# Patient Record
Sex: Male | Born: 1940 | Race: White | Hispanic: No | Marital: Married | Smoking: Never smoker
Health system: Southern US, Community
[De-identification: ages and names within clinical notes are randomized; demographics above are authoritative.]

## PROBLEM LIST (undated history)

## (undated) DIAGNOSIS — E785 Hyperlipidemia, unspecified: Secondary | ICD-10-CM

## (undated) DIAGNOSIS — K649 Unspecified hemorrhoids: Secondary | ICD-10-CM

## (undated) DIAGNOSIS — K219 Gastro-esophageal reflux disease without esophagitis: Secondary | ICD-10-CM

## (undated) DIAGNOSIS — H409 Unspecified glaucoma: Secondary | ICD-10-CM

## (undated) HISTORY — DX: Unspecified hemorrhoids: K64.9

## (undated) HISTORY — PX: CATARACT EXTRACTION: SUR2

## (undated) HISTORY — DX: Unspecified glaucoma: H40.9

## (undated) HISTORY — DX: Hyperlipidemia, unspecified: E78.5

## (undated) HISTORY — PX: OTHER SURGICAL HISTORY: SHX169

## (undated) HISTORY — DX: Gastro-esophageal reflux disease without esophagitis: K21.9

---

## 1998-06-03 HISTORY — PX: RETINAL DETACHMENT SURGERY: SHX105

## 2004-07-06 ENCOUNTER — Ambulatory Visit: Payer: Self-pay | Admitting: Family Medicine

## 2004-07-12 ENCOUNTER — Ambulatory Visit: Payer: Self-pay | Admitting: Internal Medicine

## 2005-05-29 ENCOUNTER — Ambulatory Visit: Payer: Self-pay | Admitting: Internal Medicine

## 2005-07-24 ENCOUNTER — Ambulatory Visit: Payer: Self-pay | Admitting: Internal Medicine

## 2005-09-30 ENCOUNTER — Ambulatory Visit: Payer: Self-pay | Admitting: Gastroenterology

## 2005-10-11 ENCOUNTER — Ambulatory Visit: Payer: Self-pay | Admitting: Gastroenterology

## 2006-10-22 DIAGNOSIS — L744 Anhidrosis: Secondary | ICD-10-CM | POA: Insufficient documentation

## 2006-10-24 ENCOUNTER — Ambulatory Visit: Payer: Self-pay | Admitting: Internal Medicine

## 2006-10-24 LAB — CONVERTED CEMR LAB
ALT: 22 units/L (ref 0–40)
AST: 24 units/L (ref 0–37)
Alkaline Phosphatase: 70 units/L (ref 39–117)
Basophils Absolute: 0 10*3/uL (ref 0.0–0.1)
Basophils Relative: 0.4 % (ref 0.0–1.0)
Calcium: 9.7 mg/dL (ref 8.4–10.5)
Chloride: 106 meq/L (ref 96–112)
Creatinine, Ser: 1 mg/dL (ref 0.4–1.5)
Eosinophils Absolute: 0.4 10*3/uL (ref 0.0–0.6)
GFR calc non Af Amer: 80 mL/min
Hemoglobin: 15.2 g/dL (ref 13.0–17.0)
MCHC: 34.3 g/dL (ref 30.0–36.0)
MCV: 94.7 fL (ref 78.0–100.0)
Monocytes Relative: 8.7 % (ref 3.0–11.0)
Platelets: 246 10*3/uL (ref 150–400)
Potassium: 4.3 meq/L (ref 3.5–5.1)
Total Protein: 7.2 g/dL (ref 6.0–8.3)

## 2007-08-19 ENCOUNTER — Telehealth (INDEPENDENT_AMBULATORY_CARE_PROVIDER_SITE_OTHER): Payer: Self-pay | Admitting: *Deleted

## 2008-06-03 HISTORY — PX: HERNIA REPAIR: SHX51

## 2009-12-19 ENCOUNTER — Encounter: Admission: RE | Admit: 2009-12-19 | Discharge: 2009-12-19 | Payer: Self-pay | Admitting: General Surgery

## 2010-10-16 NOTE — Assessment & Plan Note (Signed)
[facility]                        [place] [facility] OFFICE NOTE   Brian Terry, Brian Terry                        MRN:          [id number]  DATE:10/24/2006                            DOB:          1940/07/14    Brian Terry was seen for a comprehensive physical examination, Oct 24, 2006.   At this time, he is essentially asymptomatic.   Past medical history is unchanged.  He had a vein resection from the  right lower extremity.  He had detached retina on the right;for which  he previously had been followed by Dr. Fawn Kirk.  He had a  tonsillectomy remotely.  Colonoscopy in 2007 was negative.  [facility]  [facility].   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  This would  be benign elevation of total bilirubin of no clinical concern.  There is  no evidence of liver disease.   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  This does restrict some  of his activities such as playing golf in the summer.   Father had stomach cancer and colon polyps.  Mother had 4-vessel bypass  in her 85s, has a stroke.   Brian Terry has never smoked, does not drink.  He exercises cardiovascularly at  least 25 minutes at least 3 times a week with no cardiopulmonary  symptoms.   REVIEW OF SYSTEMS:  Essentially negative.  He does have increased ear  wax and uses Debrox.   He has had a minor rash on his glans penis, and he questions ongoing  infection.   He has also had minor postural symptoms once a week.   He does have bradycardia on his EKG, but he is asymptomatic from a  neurovascular standpoint related to this.   PHYSICAL EXAMINATION:  VITAL SIGNS:  He is 6 feet tall, weight is 173,  temperature 97.9, pulse 52, respiratory rate 16, and blood pressure  118/78.  HEENT:  It is slightly difficult to visualize the fundi as his pupils  are small.  The nasal septum is deviated to the right.  Dental hygiene  is excellent.  There  is some increased cerumen on the left.  NECK:  He has no lymphadenopathy about the head, neck, or axilla.  Thyroid is normal to palpation.  CARDIAC:  He has no murmurs or gallops.  All pulses are intact, and  there is no edema.  CHEST:  Clear to auscultation.  ABDOMEN:  He has no organomegaly or masses.  GENITALIA:  There is some mild rash over the glans penis.  There is a  varicocele on the left.  PROSTATE:  Prostate is within normal limits of size.  Hemoccult testing  is negative.  NEUROMUSCULAR:  Unremarkable.  NEUROPSYCHIATRIC:  Unremarkable.  SKIN:  He does have diffuse keratoses, none of which have worrisome  appearance.   Based on review of the chart, his LDL should be less than 135 based on  the protective pattern exhibited on NMR.   Unfortunately, there is little recourse other than to avoid dehydration  during the summer because of  his anhidrosis.  If he has been seated or  lying for a period of time, he should do isometrics or standing;& he  should also wear support hose if symptoms persist.   Nizoral will be prescribed for the rash.  It is important to keep the  tissue as dry as possible.  A hair dryer would be of benefit here as  well following  Nizoral application.   EKG does show a profound sinus bradycardia with a rate of 43 which is  asymptomatic.  Further recommendations are pending return of his labs.  No change will be made in his medication which includes includes Flomax  0.4 mg daily.If postural symptoms are an issue, the Flomax may need to  be held to see if it is a factor .     Titus Dubin. Alwyn Ren, MD,FACP,FCCP  Electronically Signed    [facility]  DD: 10/24/2006  DT: 10/24/2006  Job #: [id number]

## 2015-02-24 ENCOUNTER — Ambulatory Visit (INDEPENDENT_AMBULATORY_CARE_PROVIDER_SITE_OTHER): Payer: Medicare Other | Admitting: Gastroenterology

## 2015-02-24 ENCOUNTER — Encounter: Payer: Self-pay | Admitting: Gastroenterology

## 2015-02-24 VITALS — BP 96/68 | HR 60 | Ht 70.08 in | Wt 170.1 lb

## 2015-02-24 DIAGNOSIS — R1314 Dysphagia, pharyngoesophageal phase: Secondary | ICD-10-CM

## 2015-02-24 NOTE — Patient Instructions (Signed)
Your procedure is scheduled on 03/01/2015 at 8:30am at [facility] Separate instructions have been given

## 2015-02-24 NOTE — Assessment & Plan Note (Signed)
Solid food dysphagia-rule out esophageal stricture  Recommendations #1 EGD with dilation as indicated  CC Dr. Rex Kras

## 2015-02-24 NOTE — Progress Notes (Signed)
    _                                                                                                                History of Present Illness:  Brian Terry is a pleasant 74 year old white male referred at the request of Dr. Rex Kras for evaluation of dysphagia.  Over the past few months he's noted intermittent dysphagia to solids.  He's had to cough or vomit up some undigested food.  He has a sense of choking when this occurs.  He denies pyrosis.   Past Medical History  Diagnosis Date  . Hiatal hernia   . Hemorrhoids    Past Surgical History  Procedure Laterality Date  . Hernia repair Left 2010    inguinal   . Vein removal Right     R leg  . Retinal detachment surgery Right 2000   family history is not on file. Current Outpatient Prescriptions  Medication Sig Dispense Refill  . Multiple Vitamins-Minerals (MULTIVITAMIN ADULT PO) Take by mouth.    . pravastatin (PRAVACHOL) 10 MG tablet Take 10 mg by mouth.    . Psyllium (METAMUCIL PO) Take by mouth.    . tamsulosin (FLOMAX) 0.4 MG CAPS capsule Take 0.4 mg by mouth.    . timolol (TIMOPTIC) 0.5 % ophthalmic solution   98   No current facility-administered medications for this visit.   Allergies as of 02/24/2015 - Review Complete 02/24/2015  Allergen Reaction Noted  . Sulfa antibiotics  02/24/2015    reports that he has never smoked. He has never used smokeless tobacco. He reports that he does not drink alcohol. His drug history is not on file.   Review of Systems: Pertinent positive and negative review of systems were noted in the above HPI section. All other review of systems were otherwise negative.  Vital signs were reviewed in today's medical record Physical Exam: General: Well developed , well nourished, no acute distress Skin: anicteric Head: Normocephalic and atraumatic Eyes:  sclerae anicteric, EOMI Ears: Normal auditory acuity Mouth: No deformity or lesions Neck: Supple, no masses or  thyromegaly Lymph Nodes: no lymphadenopathy Lungs: Clear throughout to auscultation Heart: Regular rate and rhythm; no murmurs, rubs or bruits Gastroinestinal: Soft, non tender and non distended. No masses, hepatosplenomegaly or hernias noted. Normal Bowel sounds Rectal:deferred Musculoskeletal: Symmetrical with no gross deformities  Skin: No lesions on visible extremities Pulses:  Normal pulses noted Extremities: No clubbing, cyanosis, edema or deformities noted Neurological: Alert oriented x 4, grossly nonfocal Cervical Nodes:  No significant cervical adenopathy Inguinal Nodes: No significant inguinal adenopathy Psychological:  Alert and cooperative. Normal mood and affect  See Assessment and Plan under Problem List

## 2015-02-28 ENCOUNTER — Telehealth: Payer: Self-pay | Admitting: Gastroenterology

## 2015-02-28 NOTE — Telephone Encounter (Signed)
Spoke with patient and told him that the diet after the procedure will depend on what is done during procedure. May be clear liquids or soft foods.

## 2015-03-01 ENCOUNTER — Ambulatory Visit (HOSPITAL_COMMUNITY)
Admission: RE | Admit: 2015-03-01 | Discharge: 2015-03-01 | Disposition: A | Discharge status: Home / Self Care | Payer: Medicare Other | Source: Ambulatory Visit | Attending: Gastroenterology | Admitting: Gastroenterology

## 2015-03-01 ENCOUNTER — Encounter (HOSPITAL_COMMUNITY)
Admission: RE | Disposition: A | Discharge status: Home / Self Care | Payer: Self-pay | Source: Ambulatory Visit | Attending: Gastroenterology

## 2015-03-01 ENCOUNTER — Encounter (HOSPITAL_COMMUNITY): Payer: Self-pay

## 2015-03-01 DIAGNOSIS — K2980 Duodenitis without bleeding: Secondary | ICD-10-CM | POA: Diagnosis not present

## 2015-03-01 DIAGNOSIS — K2990 Gastroduodenitis, unspecified, without bleeding: Secondary | ICD-10-CM

## 2015-03-01 DIAGNOSIS — K222 Esophageal obstruction: Secondary | ICD-10-CM | POA: Insufficient documentation

## 2015-03-01 DIAGNOSIS — Z79899 Other long term (current) drug therapy: Secondary | ICD-10-CM | POA: Diagnosis not present

## 2015-03-01 DIAGNOSIS — R1310 Dysphagia, unspecified: Secondary | ICD-10-CM | POA: Diagnosis not present

## 2015-03-01 DIAGNOSIS — K449 Diaphragmatic hernia without obstruction or gangrene: Secondary | ICD-10-CM | POA: Insufficient documentation

## 2015-03-01 DIAGNOSIS — R1314 Dysphagia, pharyngoesophageal phase: Secondary | ICD-10-CM

## 2015-03-01 HISTORY — PX: ESOPHAGOGASTRODUODENOSCOPY: SHX5428

## 2015-03-01 HISTORY — PX: BALLOON DILATION: SHX5330

## 2015-03-01 SURGERY — EGD (ESOPHAGOGASTRODUODENOSCOPY)
Anesthesia: Moderate Sedation

## 2015-03-01 MED ORDER — FENTANYL CITRATE (PF) 100 MCG/2ML IJ SOLN
INTRAMUSCULAR | Status: DC | PRN
  Administered 2015-03-01 (×2): 25 ug via INTRAVENOUS

## 2015-03-01 MED ORDER — DIPHENHYDRAMINE HCL 50 MG/ML IJ SOLN
INTRAMUSCULAR | Status: AC
  Filled 2015-03-01: qty 1

## 2015-03-01 MED ORDER — FENTANYL CITRATE (PF) 100 MCG/2ML IJ SOLN
INTRAMUSCULAR | Status: AC
  Filled 2015-03-01: qty 2

## 2015-03-01 MED ORDER — FAMOTIDINE 40 MG PO TABS: 40.0000 mg | ORAL_TABLET | Freq: Every day | ORAL | Status: DC

## 2015-03-01 MED ORDER — MIDAZOLAM HCL 10 MG/2ML IJ SOLN
INTRAMUSCULAR | Status: DC | PRN
  Administered 2015-03-01: 2 mg via INTRAVENOUS
  Administered 2015-03-01: 1 mg via INTRAVENOUS

## 2015-03-01 MED ORDER — SODIUM CHLORIDE 0.9 % IV SOLN
INTRAVENOUS | Status: DC
  Administered 2015-03-01: 500 mL via INTRAVENOUS

## 2015-03-01 MED ORDER — MIDAZOLAM HCL 5 MG/ML IJ SOLN
INTRAMUSCULAR | Status: AC
  Filled 2015-03-01: qty 2

## 2015-03-01 NOTE — Discharge Instructions (Signed)
Esophagogastroduodenoscopy °Care After °Refer to this sheet in the next few weeks. These instructions provide you with information on caring for yourself after your procedure. Your caregiver may also give you more specific instructions. Your treatment has been planned according to current medical practices, but problems sometimes occur. Call your caregiver if you have any problems or questions after your procedure.  °HOME CARE INSTRUCTIONS °· Do not eat or drink anything until the numbing medicine (local anesthetic) has worn off and your gag reflex has returned. You will know that the local anesthetic has worn off when you can swallow comfortably. °· Do not drive for 24 hours after the procedure or as directed by your caregiver. °· Only take medicines as directed by your caregiver. °SEEK MEDICAL CARE IF:  °· You cannot stop coughing. °· You are not urinating at all or less than usual. °SEEK IMMEDIATE MEDICAL CARE IF: °· You have difficulty swallowing. °· You cannot eat or drink. °· You have worsening throat or chest pain. °· You have dizziness, lightheadedness, or you faint. °· You have nausea or vomiting. °· You have chills. °· You have a fever. °· You have severe abdominal pain. °· You have black, tarry, or bloody stools. °Document Released: 05/06/2012 Document Reviewed: 05/06/2012 °ExitCare® Patient Information ©2015 [employer]. This information is not intended to replace advice given to you by your health care provider. Make sure you discuss any questions you have with your health care provider. ° °

## 2015-03-01 NOTE — Op Note (Signed)
[facility] [place], [address]   ENDOSCOPY PROCEDURE REPORT  PATIENT: Brian Terry, Brian Terry  MR#: [id number] BIRTHDATE: [birth year] , 74  yrs. old GENDER: male ENDOSCOPIST: [name], MD REFERRED BY:  [name], M.D. PROCEDURE DATE:  03/01/2015 PROCEDURE:  EGD w/ biopsy and EGD w/ balloon dilation ASA CLASS:     Class II INDICATIONS:  dysphagia. MEDICATIONS: Versed 3 mg IV and Fentanyl 50 mcg IV TOPICAL ANESTHETIC:  DESCRIPTION OF PROCEDURE: After the risks benefits and alternatives of the procedure were thoroughly explained, informed consent was obtained.  The    endoscope was introduced through the mouth and advanced to the second portion of the duodenum , Without limitations.  The instrument was slowly withdrawn as the mucosa was fully examined.    ESOPHAGUS: There was a peptic stricture at the gastroesophageal junction.  The stricture was traversable.  Using a TTS-balloon the stricture was dilated up to 31mm (15-16.5-18mm for 30 seconds each. Moderate resistance; no heme.      DUODENUM: Moderate duodenal inflammation was found in the duodenal bulb. Multiple biopsies were performed. The remainder of the exam was normal.  Retroflexed views revealed no abnormalities.     The scope was then withdrawn from the patient and the procedure completed.  COMPLICATIONS: There were no immediate complications.  ENDOSCOPIC IMPRESSION: 1.   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   Multiple biopsies were performed 3.   Duodenal inflammation was found in the duodenal bulb  RECOMMENDATIONS: 1.  Await biopsy results 2.  Begin pepcid 40mg  qd  REPEAT EXAM:  eSigned:  [name], MD 03/01/2015 9:20 AM    CC:

## 2015-03-01 NOTE — Interval H&P Note (Signed)
History and Physical Interval Note:  03/01/2015 8:44 AM  Sheryle Spray  has presented today for surgery, with the diagnosis of dysphagia  The various methods of treatment have been discussed with the patient and family. After consideration of risks, benefits and other options for treatment, the patient has consented to  Procedure(s): ESOPHAGOGASTRODUODENOSCOPY (EGD) (N/A) BALLOON DILATION (N/A) as a surgical intervention .  The patient's history has been reviewed, patient examined, no change in status, stable for surgery.  I have reviewed the patient's chart and labs.  Questions were answered to the patient's satisfaction.    The recent H&P (dated *02/24/15**) was reviewed, the patient was examined and there is no change in the patients condition since that H&P was completed.   Erskine Emery  03/01/2015, 8:45 AM    Erskine Emery

## 2015-03-01 NOTE — H&P (View-Only) (Signed)
    _                                                                                                                History of Present Illness:  Brian Terry is a pleasant 74 year old white male referred at the request of Dr. Rex Kras for evaluation of dysphagia.  Over the past few months he's noted intermittent dysphagia to solids.  He's had to cough or vomit up some undigested food.  He has a sense of choking when this occurs.  He denies pyrosis.   Past Medical History  Diagnosis Date  . Hiatal hernia   . Hemorrhoids    Past Surgical History  Procedure Laterality Date  . Hernia repair Left 2010    inguinal   . Vein removal Right     R leg  . Retinal detachment surgery Right 2000   family history is not on file. Current Outpatient Prescriptions  Medication Sig Dispense Refill  . Multiple Vitamins-Minerals (MULTIVITAMIN ADULT PO) Take by mouth.    . pravastatin (PRAVACHOL) 10 MG tablet Take 10 mg by mouth.    . Psyllium (METAMUCIL PO) Take by mouth.    . tamsulosin (FLOMAX) 0.4 MG CAPS capsule Take 0.4 mg by mouth.    . timolol (TIMOPTIC) 0.5 % ophthalmic solution   98   No current facility-administered medications for this visit.   Allergies as of 02/24/2015 - Review Complete 02/24/2015  Allergen Reaction Noted  . Sulfa antibiotics  02/24/2015    reports that he has never smoked. He has never used smokeless tobacco. He reports that he does not drink alcohol. His drug history is not on file.   Review of Systems: Pertinent positive and negative review of systems were noted in the above HPI section. All other review of systems were otherwise negative.  Vital signs were reviewed in today's medical record Physical Exam: General: Well developed , well nourished, no acute distress Skin: anicteric Head: Normocephalic and atraumatic Eyes:  sclerae anicteric, EOMI Ears: Normal auditory acuity Mouth: No deformity or lesions Neck: Supple, no masses or  thyromegaly Lymph Nodes: no lymphadenopathy Lungs: Clear throughout to auscultation Heart: Regular rate and rhythm; no murmurs, rubs or bruits Gastroinestinal: Soft, non tender and non distended. No masses, hepatosplenomegaly or hernias noted. Normal Bowel sounds Rectal:deferred Musculoskeletal: Symmetrical with no gross deformities  Skin: No lesions on visible extremities Pulses:  Normal pulses noted Extremities: No clubbing, cyanosis, edema or deformities noted Neurological: Alert oriented x 4, grossly nonfocal Cervical Nodes:  No significant cervical adenopathy Inguinal Nodes: No significant inguinal adenopathy Psychological:  Alert and cooperative. Normal mood and affect  See Assessment and Plan under Problem List

## 2015-03-02 ENCOUNTER — Encounter (HOSPITAL_COMMUNITY): Payer: Self-pay | Admitting: Gastroenterology

## 2015-03-13 ENCOUNTER — Encounter: Payer: Self-pay | Admitting: Gastroenterology

## 2015-03-28 ENCOUNTER — Telehealth: Payer: Self-pay

## 2015-03-28 ENCOUNTER — Other Ambulatory Visit: Payer: Self-pay

## 2015-03-28 MED ORDER — FAMOTIDINE 40 MG PO TABS: 40.0000 mg | ORAL_TABLET | Freq: Every day | ORAL | Status: DC

## 2015-03-28 NOTE — Telephone Encounter (Signed)
-----   Message from Ladene Artist, MD sent at 03/15/2015 12:17 PM EDT ----- Please call the patient to review the biopsy results of duodenitis. The EGD report says to take Pepcid 40 mg daily and he should stay on this long term unless he is already on another acid reducing med.   MILD CHRONIC NONSPECIFIC DUODENITIS. NO FEATURES OF SPRUE OR GRANULOMAS. ----- Message -----    From: Greggory Keen, LPN    Sent: [id number]/[id number]  11:59 AM      To: Ladene Artist, MD  Please review this patient's surgical path report from his EGD. The letter that was generated did not have any information in it. Thanks

## 2015-03-28 NOTE — Telephone Encounter (Signed)
I have left message for the patient to call back 

## 2015-07-11 DIAGNOSIS — L820 Inflamed seborrheic keratosis: Secondary | ICD-10-CM | POA: Diagnosis not present

## 2015-07-11 DIAGNOSIS — L821 Other seborrheic keratosis: Secondary | ICD-10-CM | POA: Diagnosis not present

## 2015-07-11 DIAGNOSIS — D1801 Hemangioma of skin and subcutaneous tissue: Secondary | ICD-10-CM | POA: Diagnosis not present

## 2015-07-11 DIAGNOSIS — L812 Freckles: Secondary | ICD-10-CM | POA: Diagnosis not present

## 2015-08-29 DIAGNOSIS — H401122 Primary open-angle glaucoma, left eye, moderate stage: Secondary | ICD-10-CM | POA: Diagnosis not present

## 2015-08-29 DIAGNOSIS — H401111 Primary open-angle glaucoma, right eye, mild stage: Secondary | ICD-10-CM | POA: Diagnosis not present

## 2015-08-29 DIAGNOSIS — H338 Other retinal detachments: Secondary | ICD-10-CM | POA: Diagnosis not present

## 2015-09-13 ENCOUNTER — Encounter: Payer: Self-pay | Admitting: Gastroenterology

## 2015-09-20 DIAGNOSIS — Z125 Encounter for screening for malignant neoplasm of prostate: Secondary | ICD-10-CM | POA: Diagnosis not present

## 2015-09-20 DIAGNOSIS — R972 Elevated prostate specific antigen [PSA]: Secondary | ICD-10-CM | POA: Diagnosis not present

## 2015-09-25 ENCOUNTER — Other Ambulatory Visit: Payer: Self-pay | Admitting: Gastroenterology

## 2015-11-09 ENCOUNTER — Encounter: Payer: Self-pay | Admitting: Gastroenterology

## 2015-12-22 ENCOUNTER — Other Ambulatory Visit: Payer: Self-pay | Admitting: Gastroenterology

## 2016-01-12 ENCOUNTER — Encounter: Payer: Self-pay | Admitting: Gastroenterology

## 2016-01-31 ENCOUNTER — Encounter: Payer: Medicare Other | Admitting: Gastroenterology

## 2016-02-22 DIAGNOSIS — Z23 Encounter for immunization: Secondary | ICD-10-CM | POA: Diagnosis not present

## 2016-02-22 DIAGNOSIS — H6122 Impacted cerumen, left ear: Secondary | ICD-10-CM | POA: Diagnosis not present

## 2016-02-23 DIAGNOSIS — H401111 Primary open-angle glaucoma, right eye, mild stage: Secondary | ICD-10-CM | POA: Diagnosis not present

## 2016-02-23 DIAGNOSIS — H401122 Primary open-angle glaucoma, left eye, moderate stage: Secondary | ICD-10-CM | POA: Diagnosis not present

## 2016-02-23 DIAGNOSIS — H2513 Age-related nuclear cataract, bilateral: Secondary | ICD-10-CM | POA: Diagnosis not present

## 2016-03-15 DIAGNOSIS — Z0000 Encounter for general adult medical examination without abnormal findings: Secondary | ICD-10-CM | POA: Diagnosis not present

## 2016-03-15 DIAGNOSIS — E785 Hyperlipidemia, unspecified: Secondary | ICD-10-CM | POA: Diagnosis not present

## 2016-03-15 DIAGNOSIS — Z87898 Personal history of other specified conditions: Secondary | ICD-10-CM | POA: Diagnosis not present

## 2016-03-15 DIAGNOSIS — Z79899 Other long term (current) drug therapy: Secondary | ICD-10-CM | POA: Diagnosis not present

## 2016-03-15 DIAGNOSIS — K219 Gastro-esophageal reflux disease without esophagitis: Secondary | ICD-10-CM | POA: Diagnosis not present

## 2016-03-15 DIAGNOSIS — N401 Enlarged prostate with lower urinary tract symptoms: Secondary | ICD-10-CM | POA: Diagnosis not present

## 2016-04-05 ENCOUNTER — Encounter: Payer: Self-pay | Admitting: Gastroenterology

## 2016-04-05 ENCOUNTER — Ambulatory Visit (AMBULATORY_SURGERY_CENTER): Payer: Self-pay

## 2016-04-05 VITALS — Ht 71.0 in | Wt 165.2 lb

## 2016-04-05 DIAGNOSIS — Z8371 Family history of colonic polyps: Secondary | ICD-10-CM

## 2016-04-05 MED ORDER — NA SULFATE-K SULFATE-MG SULF 17.5-3.13-1.6 GM/177ML PO SOLN: ORAL | 0 refills | Status: DC

## 2016-04-05 NOTE — Progress Notes (Signed)
Per pt, no allergies to soy or egg products.Pt not taking any weight loss meds or using  O2 at home. 

## 2016-04-15 ENCOUNTER — Telehealth: Payer: Self-pay | Admitting: Gastroenterology

## 2016-04-15 NOTE — Telephone Encounter (Signed)
Spoke with patient and told him I would leave a sample of Suprep up front to be picked up.  Patient agreed.

## 2016-04-19 ENCOUNTER — Ambulatory Visit (AMBULATORY_SURGERY_CENTER): Payer: Medicare Other | Admitting: Gastroenterology

## 2016-04-19 ENCOUNTER — Encounter: Payer: Self-pay | Admitting: Gastroenterology

## 2016-04-19 VITALS — BP 112/66 | HR 47 | Temp 96.2°F | Resp 12 | Ht 71.0 in | Wt 165.0 lb

## 2016-04-19 DIAGNOSIS — D128 Benign neoplasm of rectum: Secondary | ICD-10-CM

## 2016-04-19 DIAGNOSIS — Z1212 Encounter for screening for malignant neoplasm of rectum: Secondary | ICD-10-CM

## 2016-04-19 DIAGNOSIS — D129 Benign neoplasm of anus and anal canal: Secondary | ICD-10-CM

## 2016-04-19 DIAGNOSIS — Z1211 Encounter for screening for malignant neoplasm of colon: Secondary | ICD-10-CM | POA: Diagnosis not present

## 2016-04-19 MED ORDER — SODIUM CHLORIDE 0.9 % IV SOLN: 500.0000 mL | INTRAVENOUS | Status: DC

## 2016-04-19 NOTE — Op Note (Signed)
[name]: Brian Terry Procedure Date: 04/19/2016 9:01 AM MRN: [id number] Endoscopist: Mallie Mussel L. Loletha Carrow , MD Age: 75 Referring MD:  Date of Birth: 20-Feb-1941 Gender: Male Account #: [id number] Procedure:                Colonoscopy Indications:              Screening for colorectal malignant neoplasm (no                            polyps on colonoscopy in 2007) Medicines:                Monitored Anesthesia Care Procedure:                Pre-Anesthesia Assessment:                           - Prior to the procedure, a History and Physical                            was performed, and patient medications and                            allergies were reviewed. The patient's tolerance of                            previous anesthesia was also reviewed. The risks                            and benefits of the procedure and the sedation                            options and risks were discussed with the patient.                            All questions were answered, and informed consent                            was obtained. Prior Anticoagulants: The patient has                            taken no previous anticoagulant or antiplatelet                            agents. ASA Grade Assessment: II - A patient with                            mild systemic disease. After reviewing the risks                            and benefits, the patient was deemed in                            satisfactory condition to undergo the procedure.  After obtaining informed consent, the colonoscope                            was passed under direct vision. Throughout the                            procedure, the patient's blood pressure, pulse, and                            oxygen saturations were monitored continuously. The                            Model PCF-H190L [id number]) scope was introduced                            through the anus and advanced to  the the cecum,                            identified by appendiceal orifice and ileocecal                            valve. The colonoscopy was performed without                            difficulty. The patient tolerated the procedure                            well. The quality of the bowel preparation was                            good. The ileocecal valve, appendiceal orifice, and                            rectum were photographed. The bowel preparation                            used was SUPREP. The quality of the bowel                            preparation was evaluated using the BBPS [facility]                            Bowel Preparation Scale) with scores of: Right                            Colon = 2, Transverse Colon = 2 and Left Colon = 2.                            The total BBPS score equals 6. Scope In: 9:05:32 AM Scope Out: 9:20:38 AM Scope Withdrawal Time: 0 hours 12 minutes 9 seconds  Total Procedure Duration: 0 hours 15 minutes 6 seconds  Findings:                 The perianal  and digital rectal examinations were                            normal.                           A 5 mm polyp was found in the rectum. The polyp was                            sessile. The polyp was removed with a cold snare.                            Resection and retrieval were complete.                           The exam was otherwise without abnormality on                            direct and retroflexion views. Complications:            No immediate complications. Estimated Blood Loss:     Estimated blood loss: none. Impression:               - One 5 mm polyp in the rectum, removed with a cold                            snare. Resected and retrieved.                           - The examination was otherwise normal on direct                            and retroflexion views. Recommendation:           - Patient has a contact number available for                            emergencies. The  signs and symptoms of potential                            delayed complications were discussed with the                            patient. Return to normal activities tomorrow.                            Written discharge instructions were provided to the                            patient.                           - Resume previous diet.                           - Continue present medications.                           -  No aspirin, ibuprofen, naproxen, or other                            non-steroidal anti-inflammatory drugs for 3 days                            after polyp removal.                           - Await pathology results.                           - Repeat colonoscopy is recommended for                            surveillance. The colonoscopy date will be                            determined after pathology results from today's                            exam become available for review. Siah Kannan L. Loletha Carrow, MD 04/19/2016 9:26:32 AM This report has been signed electronically.

## 2016-04-19 NOTE — Patient Instructions (Signed)
Impression/Recommendations:  Polyp handout given to patient. No aspiring, ibuprofen, naproxen, or other NSAIDS for 3 days following polyp removal.   Tylenol only.  Repeat colonoscopy recommended for surveillance based on pathology results.  YOU HAD AN ENDOSCOPIC PROCEDURE TODAY AT [facility]:   Refer to the procedure report that was given to you for any specific questions about what was found during the examination.  If the procedure report does not answer your questions, please call your gastroenterologist to clarify.  If you requested that your care partner not be given the details of your procedure findings, then the procedure report has been included in a sealed envelope for you to review at your convenience later.  YOU SHOULD EXPECT: Some feelings of bloating in the abdomen. Passage of more gas than usual.  Walking can help get rid of the air that was put into your GI tract during the procedure and reduce the bloating. If you had a lower endoscopy (such as a colonoscopy or flexible sigmoidoscopy) you may notice spotting of blood in your stool or on the toilet paper. If you underwent a bowel prep for your procedure, you may not have a normal bowel movement for a few days.  Please Note:  You might notice some irritation and congestion in your nose or some drainage.  This is from the oxygen used during your procedure.  There is no need for concern and it should clear up in a day or so.  SYMPTOMS TO REPORT IMMEDIATELY:   Following lower endoscopy (colonoscopy or flexible sigmoidoscopy):  Excessive amounts of blood in the stool  Significant tenderness or worsening of abdominal pains  Swelling of the abdomen that is new, acute  Fever of 100F or higher For urgent or emergent issues, a gastroenterologist can be reached at any hour by calling [phone].   DIET:  We do recommend a small meal at first, but then you may proceed to your regular diet.  Drink plenty of fluids  but you should avoid alcoholic beverages for 24 hours.  ACTIVITY:  You should plan to take it easy for the rest of today and you should NOT DRIVE or use heavy machinery until tomorrow (because of the sedation medicines used during the test).    FOLLOW UP: Our staff will call the number listed on your records the next business day following your procedure to check on you and address any questions or concerns that you may have regarding the information given to you following your procedure. If we do not reach you, we will leave a message.  However, if you are feeling well and you are not experiencing any problems, there is no need to return our call.  We will assume that you have returned to your regular daily activities without incident.  If any biopsies were taken you will be contacted by phone or by letter within the next 1-3 weeks.  Please call us at [phone] if you have not heard about the biopsies in 3 weeks.    SIGNATURES/CONFIDENTIALITY: You and/or your care partner have signed paperwork which will be entered into your electronic medical record.  These signatures attest to the fact that that the information above on your After Visit Summary has been reviewed and is understood.  Full responsibility of the confidentiality of this discharge information lies with you and/or your care-partner.

## 2016-04-19 NOTE — Progress Notes (Signed)
Called to room to assist during endoscopic procedure.  Patient ID and intended procedure confirmed with present staff. Received instructions for my participation in the procedure from the performing physician.  

## 2016-04-19 NOTE — Progress Notes (Signed)
Patient HR 48. Patient denies symptoms and states this is a normal HR for him.

## 2016-04-19 NOTE — Progress Notes (Signed)
To recovery Awake alert VSS  Report to RN

## 2016-04-22 ENCOUNTER — Telehealth: Payer: Self-pay

## 2016-04-22 NOTE — Telephone Encounter (Signed)
  Follow up Call-  Call back number 04/19/2016  Post procedure Call Back phone  # [phone] (wife Bethena Roys)  Permission to leave phone message Yes  Some recent data might be hidden     Patient questions:  Do you have a fever, pain , or abdominal swelling? No. Pain Score  0 *  Have you tolerated food without any problems? Yes.    Have you been able to return to your normal activities? Yes.    Do you have any questions about your discharge instructions: Diet   No. Medications  No. Follow up visit  No.  Do you have questions or concerns about your Care? No.  Actions: * If pain score is 4 or above: No action needed, pain <4.

## 2016-04-23 ENCOUNTER — Encounter: Payer: Self-pay | Admitting: Gastroenterology

## 2016-07-09 DIAGNOSIS — L814 Other melanin hyperpigmentation: Secondary | ICD-10-CM | POA: Diagnosis not present

## 2016-07-09 DIAGNOSIS — L821 Other seborrheic keratosis: Secondary | ICD-10-CM | POA: Diagnosis not present

## 2016-07-09 DIAGNOSIS — D225 Melanocytic nevi of trunk: Secondary | ICD-10-CM | POA: Diagnosis not present

## 2016-09-06 DIAGNOSIS — H401131 Primary open-angle glaucoma, bilateral, mild stage: Secondary | ICD-10-CM | POA: Diagnosis not present

## 2016-09-06 DIAGNOSIS — D3132 Benign neoplasm of left choroid: Secondary | ICD-10-CM | POA: Diagnosis not present

## 2016-09-12 DIAGNOSIS — J019 Acute sinusitis, unspecified: Secondary | ICD-10-CM | POA: Diagnosis not present

## 2016-09-27 DIAGNOSIS — H903 Sensorineural hearing loss, bilateral: Secondary | ICD-10-CM | POA: Diagnosis not present

## 2016-11-14 DIAGNOSIS — M7989 Other specified soft tissue disorders: Secondary | ICD-10-CM | POA: Diagnosis not present

## 2016-11-14 DIAGNOSIS — R252 Cramp and spasm: Secondary | ICD-10-CM | POA: Diagnosis not present

## 2016-11-14 DIAGNOSIS — M79604 Pain in right leg: Secondary | ICD-10-CM | POA: Diagnosis not present

## 2016-11-15 LAB — LAB REPORT - SCANNED: EGFR: 83

## 2017-02-19 DIAGNOSIS — Z23 Encounter for immunization: Secondary | ICD-10-CM | POA: Diagnosis not present

## 2017-03-14 DIAGNOSIS — H401131 Primary open-angle glaucoma, bilateral, mild stage: Secondary | ICD-10-CM | POA: Diagnosis not present

## 2017-03-14 DIAGNOSIS — H1859 Other hereditary corneal dystrophies: Secondary | ICD-10-CM | POA: Diagnosis not present

## 2017-03-14 DIAGNOSIS — H2513 Age-related nuclear cataract, bilateral: Secondary | ICD-10-CM | POA: Diagnosis not present

## 2017-03-14 DIAGNOSIS — H1789 Other corneal scars and opacities: Secondary | ICD-10-CM | POA: Diagnosis not present

## 2017-03-19 DIAGNOSIS — M62838 Other muscle spasm: Secondary | ICD-10-CM | POA: Diagnosis not present

## 2017-03-21 DIAGNOSIS — M799 Soft tissue disorder, unspecified: Secondary | ICD-10-CM | POA: Diagnosis not present

## 2017-03-21 DIAGNOSIS — R293 Abnormal posture: Secondary | ICD-10-CM | POA: Diagnosis not present

## 2017-03-21 DIAGNOSIS — M542 Cervicalgia: Secondary | ICD-10-CM | POA: Diagnosis not present

## 2017-03-21 DIAGNOSIS — M6281 Muscle weakness (generalized): Secondary | ICD-10-CM | POA: Diagnosis not present

## 2017-03-25 DIAGNOSIS — R293 Abnormal posture: Secondary | ICD-10-CM | POA: Diagnosis not present

## 2017-03-25 DIAGNOSIS — M6281 Muscle weakness (generalized): Secondary | ICD-10-CM | POA: Diagnosis not present

## 2017-03-25 DIAGNOSIS — M542 Cervicalgia: Secondary | ICD-10-CM | POA: Diagnosis not present

## 2017-03-25 DIAGNOSIS — M799 Soft tissue disorder, unspecified: Secondary | ICD-10-CM | POA: Diagnosis not present

## 2017-03-27 DIAGNOSIS — M542 Cervicalgia: Secondary | ICD-10-CM | POA: Diagnosis not present

## 2017-03-27 DIAGNOSIS — R293 Abnormal posture: Secondary | ICD-10-CM | POA: Diagnosis not present

## 2017-03-27 DIAGNOSIS — M799 Soft tissue disorder, unspecified: Secondary | ICD-10-CM | POA: Diagnosis not present

## 2017-03-27 DIAGNOSIS — M6281 Muscle weakness (generalized): Secondary | ICD-10-CM | POA: Diagnosis not present

## 2017-04-01 DIAGNOSIS — R293 Abnormal posture: Secondary | ICD-10-CM | POA: Diagnosis not present

## 2017-04-01 DIAGNOSIS — M799 Soft tissue disorder, unspecified: Secondary | ICD-10-CM | POA: Diagnosis not present

## 2017-04-01 DIAGNOSIS — M542 Cervicalgia: Secondary | ICD-10-CM | POA: Diagnosis not present

## 2017-04-01 DIAGNOSIS — M6281 Muscle weakness (generalized): Secondary | ICD-10-CM | POA: Diagnosis not present

## 2017-04-04 DIAGNOSIS — M542 Cervicalgia: Secondary | ICD-10-CM | POA: Diagnosis not present

## 2017-04-04 DIAGNOSIS — R293 Abnormal posture: Secondary | ICD-10-CM | POA: Diagnosis not present

## 2017-04-04 DIAGNOSIS — M799 Soft tissue disorder, unspecified: Secondary | ICD-10-CM | POA: Diagnosis not present

## 2017-04-04 DIAGNOSIS — M6281 Muscle weakness (generalized): Secondary | ICD-10-CM | POA: Diagnosis not present

## 2017-04-14 DIAGNOSIS — H1789 Other corneal scars and opacities: Secondary | ICD-10-CM | POA: Diagnosis not present

## 2017-04-14 DIAGNOSIS — H1859 Other hereditary corneal dystrophies: Secondary | ICD-10-CM | POA: Diagnosis not present

## 2017-04-30 DIAGNOSIS — E785 Hyperlipidemia, unspecified: Secondary | ICD-10-CM | POA: Diagnosis not present

## 2017-04-30 DIAGNOSIS — N401 Enlarged prostate with lower urinary tract symptoms: Secondary | ICD-10-CM | POA: Diagnosis not present

## 2017-04-30 DIAGNOSIS — Z0000 Encounter for general adult medical examination without abnormal findings: Secondary | ICD-10-CM | POA: Diagnosis not present

## 2017-04-30 DIAGNOSIS — K219 Gastro-esophageal reflux disease without esophagitis: Secondary | ICD-10-CM | POA: Diagnosis not present

## 2017-04-30 DIAGNOSIS — N400 Enlarged prostate without lower urinary tract symptoms: Secondary | ICD-10-CM | POA: Diagnosis not present

## 2017-06-06 DIAGNOSIS — H2513 Age-related nuclear cataract, bilateral: Secondary | ICD-10-CM | POA: Diagnosis not present

## 2017-06-19 DIAGNOSIS — H21561 Pupillary abnormality, right eye: Secondary | ICD-10-CM | POA: Diagnosis not present

## 2017-06-19 DIAGNOSIS — H25811 Combined forms of age-related cataract, right eye: Secondary | ICD-10-CM | POA: Diagnosis not present

## 2017-06-19 DIAGNOSIS — H2511 Age-related nuclear cataract, right eye: Secondary | ICD-10-CM | POA: Diagnosis not present

## 2017-06-19 DIAGNOSIS — H2181 Floppy iris syndrome: Secondary | ICD-10-CM | POA: Diagnosis not present

## 2017-07-09 DIAGNOSIS — D229 Melanocytic nevi, unspecified: Secondary | ICD-10-CM | POA: Diagnosis not present

## 2017-07-09 DIAGNOSIS — D1801 Hemangioma of skin and subcutaneous tissue: Secondary | ICD-10-CM | POA: Diagnosis not present

## 2017-07-09 DIAGNOSIS — L814 Other melanin hyperpigmentation: Secondary | ICD-10-CM | POA: Diagnosis not present

## 2017-07-09 DIAGNOSIS — L821 Other seborrheic keratosis: Secondary | ICD-10-CM | POA: Diagnosis not present

## 2017-07-18 DIAGNOSIS — Z961 Presence of intraocular lens: Secondary | ICD-10-CM | POA: Diagnosis not present

## 2017-07-24 DIAGNOSIS — H21562 Pupillary abnormality, left eye: Secondary | ICD-10-CM | POA: Diagnosis not present

## 2017-07-24 DIAGNOSIS — H2512 Age-related nuclear cataract, left eye: Secondary | ICD-10-CM | POA: Diagnosis not present

## 2017-07-24 DIAGNOSIS — H25812 Combined forms of age-related cataract, left eye: Secondary | ICD-10-CM | POA: Diagnosis not present

## 2017-09-01 DIAGNOSIS — H1789 Other corneal scars and opacities: Secondary | ICD-10-CM | POA: Diagnosis not present

## 2017-10-08 DIAGNOSIS — J069 Acute upper respiratory infection, unspecified: Secondary | ICD-10-CM | POA: Diagnosis not present

## 2017-10-09 ENCOUNTER — Emergency Department (HOSPITAL_COMMUNITY)
Admission: EM | Admit: 2017-10-09 | Discharge: 2017-10-09 | Disposition: A | Discharge status: Home / Self Care | Payer: Medicare Other | Attending: Emergency Medicine | Admitting: Emergency Medicine

## 2017-10-09 ENCOUNTER — Encounter (HOSPITAL_COMMUNITY): Payer: Self-pay | Admitting: Emergency Medicine

## 2017-10-09 ENCOUNTER — Other Ambulatory Visit: Payer: Self-pay

## 2017-10-09 DIAGNOSIS — Z79899 Other long term (current) drug therapy: Secondary | ICD-10-CM | POA: Insufficient documentation

## 2017-10-09 DIAGNOSIS — R42 Dizziness and giddiness: Secondary | ICD-10-CM | POA: Insufficient documentation

## 2017-10-09 LAB — BASIC METABOLIC PANEL
Anion gap: 8 (ref 5–15)
BUN: 16 mg/dL (ref 6–20)
CO2: 26 mmol/L (ref 22–32)
Calcium: 9 mg/dL (ref 8.9–10.3)
Chloride: 108 mmol/L (ref 101–111)
Creatinine, Ser: 0.96 mg/dL (ref 0.61–1.24)
GFR calc Af Amer: >60 mL/min (ref >60)
GFR calc non Af Amer: >60 mL/min (ref >60)
Glucose, Bld: 104 mg/dL — ABNORMAL HIGH (ref 65–99)
Potassium: 4.4 mmol/L (ref 3.5–5.1)
Sodium: 142 mmol/L (ref 135–145)

## 2017-10-09 LAB — CBC WITH DIFFERENTIAL/PLATELET
Basophils Absolute: 0 10*3/uL (ref 0.0–0.1)
Basophils Relative: 0 %
Eosinophils Absolute: 0.3 10*3/uL (ref 0.0–0.7)
Eosinophils Relative: 4 %
HCT: 38.3 % — ABNORMAL LOW (ref 39.0–52.0)
Hemoglobin: 13 g/dL (ref 13.0–17.0)
Lymphocytes Relative: 20 %
Lymphs Abs: 1.5 10*3/uL (ref 0.7–4.0)
MCH: 34.1 pg — ABNORMAL HIGH (ref 26.0–34.0)
MCHC: 33.9 g/dL (ref 30.0–36.0)
MCV: 100.5 fL — ABNORMAL HIGH (ref 78.0–100.0)
Monocytes Absolute: 0.6 10*3/uL (ref 0.1–1.0)
Monocytes Relative: 8 %
Neutro Abs: 5 10*3/uL (ref 1.7–7.7)
Neutrophils Relative %: 68 %
Platelets: 129 10*3/uL — ABNORMAL LOW (ref 150–400)
RBC: 3.81 MIL/uL — ABNORMAL LOW (ref 4.22–5.81)
RDW: 13.8 % (ref 11.5–15.5)
WBC: 7.4 10*3/uL (ref 4.0–10.5)

## 2017-10-09 MED ORDER — SODIUM CHLORIDE 0.9 % IV BOLUS
1000.0000 mL | Freq: Once | INTRAVENOUS | Status: AC
  Administered 2017-10-09: 1000 mL via INTRAVENOUS

## 2017-10-09 NOTE — ED Triage Notes (Signed)
Patient reports attempting to go to the bathroom this morning, but could not make it due to dizziness. Patient felt so dizzy he fell to his knees and rested. After feeling better, he got up to try to get to the bathroom again but again felt dizzy. He reports believing this may have something to do with the medications he is taking. When EMS arrrived they reported his BP was too low to read. Patient reports to head injury, no pain and that he is not on blood thinners.

## 2017-10-09 NOTE — ED Notes (Signed)
ED Provider at bedside. KOHUT 

## 2017-10-09 NOTE — ED Notes (Signed)
Bed: UY23 Expected date:  Expected time:  Means of arrival:  Comments: 77 yo M  Fall

## 2017-10-09 NOTE — ED Provider Notes (Signed)
[facility] DEPT Provider Note   CSN: [id number] Arrival date & time: 10/09/17  0705     History   Chief Complaint Chief Complaint  Patient presents with  . Dizziness    HPI Brian Terry is a 77 y.o. male.  HPI   77 year old male with dizziness.  Happened just before arrival.  Patient was getting up out of bed when he began to feel dizzy.  He felt if he may pass out.  He denies sensation of room spinning.  No dyspnea.  No ear ringing.  No palpitations.  He was able to lower himself to the ground.  He did not lose consciousness.  He is not actually fall.  Symptoms improved and he went to get back up.  Same symptoms returned again.  On EMS arrival his blood pressure was reportedly very low.  Arrival to the emergency room he was low normotensive reported resolution of symptoms.  He was feeling fine over the past few days.  Is any fevers or chills.  Past Medical History:  Diagnosis Date  . GERD (gastroesophageal reflux disease)   . Glaucoma   . Hemorrhoids   . Hyperlipidemia     Patient Active Problem List   Diagnosis Date Noted  . Esophageal stricture 03/01/2015  . Duodenitis 03/01/2015  . Dysphagia, pharyngoesophageal phase 02/24/2015  . GILBERT'S SYNDROME 10/22/2006  . ANHIDROSIS 10/22/2006    Past Surgical History:  Procedure Laterality Date  . BALLOON DILATION N/A 03/01/2015   Procedure: BALLOON DILATION;  Surgeon: [name], MD;  Location: [facility];  Service: Endoscopy;  Laterality: N/A;  . ESOPHAGOGASTRODUODENOSCOPY N/A 03/01/2015   Procedure: ESOPHAGOGASTRODUODENOSCOPY (EGD);  Surgeon: [name], MD;  Location: [facility];  Service: Endoscopy;  Laterality: N/A;  . HERNIA REPAIR Left 2010   inguinal   . RETINAL DETACHMENT SURGERY Right 2000  . vein removal Right    R leg        Home Medications    Prior to Admission medications   Medication Sig Start Date End Date Taking? Authorizing Provider  famotidine  (PEPCID) 40 MG tablet TAKE 1 TABLET IN THE MORNING. 09/25/15   Ladene Artist, MD  Multiple Vitamins-Minerals (MULTIVITAMIN ADULT PO) Take by mouth. Solgar VM-75 MVI with chelated minerals-Take as directed    [provider]  pravastatin (PRAVACHOL) 10 MG tablet Take 10 mg by mouth.    [provider]  Psyllium (METAMUCIL PO) Take by mouth.     [provider]  tamsulosin (FLOMAX) 0.4 MG CAPS capsule Take 0.4 mg by mouth.    [provider]  timolol (TIMOPTIC) 0.5 % ophthalmic solution Place 1 drop into both eyes 2 (two) times daily.  01/27/15   [provider]    Family History Family History  Problem Relation Age of Onset  . Heart disease Mother   . Colon polyps Father   . Stomach cancer Father   . Heart disease Brother     Social History Social History   Tobacco Use  . Smoking status: Never Smoker  . Smokeless tobacco: Never Used  Substance Use Topics  . Alcohol use: No    Alcohol/week: 0.0 oz  . Drug use: No     Allergies   Sulfa antibiotics   Review of Systems Review of Systems  All systems reviewed and negative, other than as noted in HPI.  Physical Exam Updated Vital Signs BP 125/85 (BP Location: Left Arm)   Pulse 76   Temp  97.9 F (36.6 C) (Oral)   Resp 19   Ht 5\' 11"  (1.803 m)   Wt 73.9 kg (163 lb)   SpO2 98%   BMI 22.73 kg/m   Physical Exam  Constitutional: He appears well-developed and well-nourished. No distress.  HENT:  Head: Normocephalic and atraumatic.  Eyes: Conjunctivae are normal. Right eye exhibits no discharge. Left eye exhibits no discharge.  Neck: Neck supple.  Cardiovascular: Normal rate, regular rhythm and normal heart sounds. Exam reveals no gallop and no friction rub.  No murmur heard. Pulmonary/Chest: Effort normal and breath sounds normal. No respiratory distress.  Abdominal: Soft. He exhibits no distension. There is no tenderness.  Musculoskeletal: He exhibits no edema or  tenderness.  Neurological: He is alert.  Skin: Skin is warm and dry.  Psychiatric: He has a normal mood and affect. His behavior is normal. Thought content normal.  Nursing note and vitals reviewed.    ED Treatments / Results  Labs (all labs ordered are listed, but only abnormal results are displayed) Labs Reviewed  CBC WITH DIFFERENTIAL/PLATELET - Abnormal; Notable for the following components:      Result Value   RBC 3.81 (*)    HCT 38.3 (*)    MCV 100.5 (*)    MCH 34.1 (*)    Platelets 129 (*)    All other components within normal limits  BASIC METABOLIC PANEL - Abnormal; Notable for the following components:   Glucose, Bld 104 (*)    All other components within normal limits    EKG EKG Interpretation  Date/Time:  Thursday Oct 09 2017 07:44:59 EDT Ventricular Rate:  55 PR Interval:    QRS Duration: 110 QT Interval:  447 QTC Calculation: 428 R Axis:   -11 Text Interpretation:  Sinus rhythm RSR' in V1 or V2, right VCD or RVH Minimal ST elevation, anterior leads Confirmed by Virgel Manifold [phone]) on 10/09/2017 10:50:42 AM   Radiology No results found.  Procedures Procedures (including critical care time)  Medications Ordered in ED Medications  sodium chloride 0.9 % bolus 1,000 mL (1,000 mLs Intravenous New Bag/Given 10/09/17 0823)     Initial Impression / Assessment and Plan / ED Course  I have reviewed the triage vital signs and the nursing notes.  Pertinent labs & imaging results that were available during my care of the patient were reviewed by me and considered in my medical decision making (see chart for details).     77 year old male with dizziness.  He did not syncopize.  Denies vertigo.  I get the impression that this may be orthostatic hypotension.  Symptoms exacerbated with changes in position.  Reportedly his blood pressure was very low on EMS arrival.  Is now improved as well as his symptoms.  Tries to keep a log of his blood pressure over the next  several days.  He needs to be very careful and give himself extra time when he is getting up from a laying or seated position.  Follow-up with PCP.  Return precautions discussed.  Final Clinical Impressions(s) / ED Diagnoses   Final diagnoses:  Dizziness    ED Discharge Orders    None       Virgel Manifold, MD 10/15/17 1157

## 2017-10-09 NOTE — ED Notes (Signed)
Ambulated pt, o2 at 96%.

## 2017-10-09 NOTE — ED Notes (Signed)
NO RX GIVEN 

## 2017-10-14 DIAGNOSIS — Z961 Presence of intraocular lens: Secondary | ICD-10-CM | POA: Diagnosis not present

## 2018-02-15 ENCOUNTER — Other Ambulatory Visit: Payer: Self-pay

## 2018-02-15 ENCOUNTER — Emergency Department (HOSPITAL_COMMUNITY): Payer: Medicare Other

## 2018-02-15 ENCOUNTER — Encounter (HOSPITAL_COMMUNITY): Payer: Self-pay | Admitting: Emergency Medicine

## 2018-02-15 ENCOUNTER — Emergency Department (HOSPITAL_COMMUNITY)
Admission: EM | Admit: 2018-02-15 | Discharge: 2018-02-15 | Disposition: A | Discharge status: Home / Self Care | Payer: Medicare Other | Attending: Emergency Medicine | Admitting: Emergency Medicine

## 2018-02-15 DIAGNOSIS — R109 Unspecified abdominal pain: Secondary | ICD-10-CM | POA: Diagnosis not present

## 2018-02-15 DIAGNOSIS — S199XXA Unspecified injury of neck, initial encounter: Secondary | ICD-10-CM | POA: Diagnosis present

## 2018-02-15 DIAGNOSIS — S161XXA Strain of muscle, fascia and tendon at neck level, initial encounter: Secondary | ICD-10-CM | POA: Diagnosis not present

## 2018-02-15 DIAGNOSIS — M542 Cervicalgia: Secondary | ICD-10-CM | POA: Diagnosis not present

## 2018-02-15 DIAGNOSIS — R42 Dizziness and giddiness: Secondary | ICD-10-CM | POA: Insufficient documentation

## 2018-02-15 DIAGNOSIS — S3991XA Unspecified injury of abdomen, initial encounter: Secondary | ICD-10-CM | POA: Diagnosis not present

## 2018-02-15 DIAGNOSIS — Y9389 Activity, other specified: Secondary | ICD-10-CM | POA: Diagnosis not present

## 2018-02-15 DIAGNOSIS — Y92410 Unspecified street and highway as the place of occurrence of the external cause: Secondary | ICD-10-CM | POA: Insufficient documentation

## 2018-02-15 DIAGNOSIS — Z79899 Other long term (current) drug therapy: Secondary | ICD-10-CM | POA: Insufficient documentation

## 2018-02-15 DIAGNOSIS — S0990XA Unspecified injury of head, initial encounter: Secondary | ICD-10-CM | POA: Diagnosis not present

## 2018-02-15 DIAGNOSIS — R51 Headache: Secondary | ICD-10-CM | POA: Diagnosis not present

## 2018-02-15 DIAGNOSIS — Y998 Other external cause status: Secondary | ICD-10-CM | POA: Diagnosis not present

## 2018-02-15 DIAGNOSIS — R55 Syncope and collapse: Secondary | ICD-10-CM | POA: Diagnosis not present

## 2018-02-15 LAB — COMPREHENSIVE METABOLIC PANEL
ALT: 25 U/L (ref 0–44)
AST: 30 U/L (ref 15–41)
Albumin: 4.2 g/dL (ref 3.5–5.0)
Alkaline Phosphatase: 57 U/L (ref 38–126)
Anion gap: 7 (ref 5–15)
BUN: 16 mg/dL (ref 8–23)
CALCIUM: 9.6 mg/dL (ref 8.9–10.3)
CHLORIDE: 109 mmol/L (ref 98–111)
CO2: 28 mmol/L (ref 22–32)
CREATININE: 0.81 mg/dL (ref 0.61–1.24)
GFR calc non Af Amer: >60 mL/min (ref >60)
GLUCOSE: 114 mg/dL — AB (ref 70–99)
Potassium: 4.5 mmol/L (ref 3.5–5.1)
SODIUM: 144 mmol/L (ref 135–145)
Total Bilirubin: 2.6 mg/dL — ABNORMAL HIGH (ref 0.3–1.2)
Total Protein: 7.2 g/dL (ref 6.5–8.1)

## 2018-02-15 LAB — CBC WITH DIFFERENTIAL/PLATELET
BASOS PCT: 0 %
Basophils Absolute: 0 10*3/uL (ref 0.0–0.1)
EOS ABS: 0.2 10*3/uL (ref 0.0–0.7)
Eosinophils Relative: 2 %
HCT: 43.5 % (ref 39.0–52.0)
HEMOGLOBIN: 15.1 g/dL (ref 13.0–17.0)
Lymphocytes Relative: 32 %
Lymphs Abs: 2.7 10*3/uL (ref 0.7–4.0)
MCH: 34.2 pg — ABNORMAL HIGH (ref 26.0–34.0)
MCHC: 34.7 g/dL (ref 30.0–36.0)
MCV: 98.4 fL (ref 78.0–100.0)
MONO ABS: 0.7 10*3/uL (ref 0.1–1.0)
MONOS PCT: 9 %
NEUTROS PCT: 57 %
Neutro Abs: 4.9 10*3/uL (ref 1.7–7.7)
Platelets: 183 10*3/uL (ref 150–400)
RBC: 4.42 MIL/uL (ref 4.22–5.81)
RDW: 13.6 % (ref 11.5–15.5)
WBC: 8.5 10*3/uL (ref 4.0–10.5)

## 2018-02-15 LAB — I-STAT CHEM 8, ED
BUN: 15 mg/dL (ref 8–23)
CALCIUM ION: 1.21 mmol/L (ref 1.15–1.40)
CREATININE: 0.9 mg/dL (ref 0.61–1.24)
Chloride: 105 mmol/L (ref 98–111)
Glucose, Bld: 109 mg/dL — ABNORMAL HIGH (ref 70–99)
HEMATOCRIT: 42 % (ref 39.0–52.0)
HEMOGLOBIN: 14.3 g/dL (ref 13.0–17.0)
Potassium: 4.2 mmol/L (ref 3.5–5.1)
SODIUM: 138 mmol/L (ref 135–145)
TCO2: 25 mmol/L (ref 22–32)

## 2018-02-15 MED ORDER — IOPAMIDOL (ISOVUE-300) INJECTION 61%
100.0000 mL | Freq: Once | INTRAVENOUS | Status: AC | PRN
  Administered 2018-02-15: 100 mL via INTRAVENOUS

## 2018-02-15 MED ORDER — IOPAMIDOL (ISOVUE-300) INJECTION 61%
INTRAVENOUS | Status: AC
  Filled 2018-02-15: qty 100
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
## 2018-02-15 NOTE — ED Notes (Signed)
He c/o "sore and tight" across bilat. Trapezius/lower neck areas; also c/o a small area of pain at left flank. CMS intact all extremities.

## 2018-02-15 NOTE — Discharge Instructions (Signed)
He can use Tylenol for symptomatic relief.  Heating pads may also be helpful.  You should notify your doctor that your CT scan did show some increased calcifications in your coronary arteries and aorta.  There is no evidence of traumatic injury under CT scans.  Follow-up with your primary care provider for recheck.  Return here as needed for any worsening symptoms.

## 2018-02-26 DIAGNOSIS — Z23 Encounter for immunization: Secondary | ICD-10-CM | POA: Diagnosis not present

## 2018-03-11 DIAGNOSIS — H401131 Primary open-angle glaucoma, bilateral, mild stage: Secondary | ICD-10-CM | POA: Diagnosis not present

## 2018-06-05 DIAGNOSIS — H409 Unspecified glaucoma: Secondary | ICD-10-CM | POA: Diagnosis not present

## 2018-06-05 DIAGNOSIS — E785 Hyperlipidemia, unspecified: Secondary | ICD-10-CM | POA: Diagnosis not present

## 2018-06-05 DIAGNOSIS — K219 Gastro-esophageal reflux disease without esophagitis: Secondary | ICD-10-CM | POA: Diagnosis not present

## 2018-06-05 DIAGNOSIS — Z0000 Encounter for general adult medical examination without abnormal findings: Secondary | ICD-10-CM | POA: Diagnosis not present
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
## 2018-06-12 ENCOUNTER — Ambulatory Visit: Payer: Medicare Other | Admitting: Cardiology

## 2018-06-12 ENCOUNTER — Encounter: Payer: Self-pay | Admitting: Cardiology

## 2018-06-12 VITALS — BP 100/60 | HR 51 | Ht 70.5 in | Wt 155.4 lb

## 2018-06-12 DIAGNOSIS — E785 Hyperlipidemia, unspecified: Secondary | ICD-10-CM

## 2018-06-12 DIAGNOSIS — R001 Bradycardia, unspecified: Secondary | ICD-10-CM

## 2018-06-12 DIAGNOSIS — I700 Atherosclerosis of aorta: Secondary | ICD-10-CM | POA: Diagnosis not present

## 2018-06-12 DIAGNOSIS — R931 Abnormal findings on diagnostic imaging of heart and coronary circulation: Secondary | ICD-10-CM

## 2018-06-12 NOTE — Patient Instructions (Signed)
Medication Instructions:  Continue current medications  If you need a refill on your cardiac medications before your next appointment, please call your pharmacy.  Labwork: None Ordered   Take the provided lab slips with you to the lab for your blood draw.  When you have your labs (blood work) drawn today and your tests are completely normal, you will receive your results only by MyChart Message (if you have MyChart) -OR-  A paper copy in the mail.  If you have any lab test that is abnormal or we need to change your treatment, we will call you to review these results.  Testing/Procedures: Your physician has requested that you have an exercise tolerance test. For further information please visit [email or web address]. Please also follow instruction sheet, as given.   Follow-Up: Your physician recommends that you schedule a follow-up appointment in: As Needed  At [facility], you and your health needs are our priority.  As part of our continuing mission to provide you with exceptional heart care, we have created designated Provider Care Teams.  These Care Teams include your primary Cardiologist (physician) and Advanced Practice Providers (APPs -  Physician Assistants and Nurse Practitioners) who all work together to provide you with the care you need, when you need it.  Thank you for choosing [facility] at [facility]!!

## 2018-06-16 ENCOUNTER — Telehealth (HOSPITAL_COMMUNITY): Payer: Self-pay

## 2018-06-16 NOTE — Addendum Note (Signed)
Addended by: Alvina Filbert B on: 06/16/2018 12:25 PM   Modules accepted: Orders

## 2018-06-16 NOTE — Telephone Encounter (Signed)
Encounter complete. 

## 2018-06-17 ENCOUNTER — Encounter (HOSPITAL_COMMUNITY): Payer: Self-pay | Admitting: *Deleted

## 2018-06-17 ENCOUNTER — Ambulatory Visit (HOSPITAL_COMMUNITY)
Admission: RE | Admit: 2018-06-17 | Discharge: 2018-06-17 | Disposition: A | Discharge status: Home / Self Care | Payer: Medicare Other | Source: Ambulatory Visit | Attending: Cardiology | Admitting: Cardiology

## 2018-06-17 DIAGNOSIS — R931 Abnormal findings on diagnostic imaging of heart and coronary circulation: Secondary | ICD-10-CM | POA: Diagnosis not present

## 2018-06-17 LAB — EXERCISE TOLERANCE TEST
CHL CUP MPHR: 144 {beats}/min
CHL CUP RESTING HR STRESS: 58 {beats}/min
CHL RATE OF PERCEIVED EXERTION: 17
CSEPEDS: 0 s
CSEPHR: 93 %
CSEPPHR: 134 {beats}/min
Estimated workload: 10.1 METS
Exercise duration (min): 8 min

## 2018-06-17 NOTE — Progress Notes (Signed)
Dr. [name] reviewed abnormal ETT and said that pt may leave.

## 2018-06-19 ENCOUNTER — Telehealth: Payer: Self-pay | Admitting: Cardiology

## 2018-06-19 NOTE — Telephone Encounter (Signed)
° ° °  Patient has questions regarding possible heart cath procedure

## 2018-06-22 NOTE — Telephone Encounter (Signed)
Returned call to pt, pt have appt with Dr [name] 8:20 01/27

## 2018-06-25 ENCOUNTER — Telehealth: Payer: Self-pay | Admitting: Cardiology

## 2018-06-25 DIAGNOSIS — Z01812 Encounter for preprocedural laboratory examination: Secondary | ICD-10-CM

## 2018-06-25 DIAGNOSIS — R9439 Abnormal result of other cardiovascular function study: Secondary | ICD-10-CM

## 2018-06-25 DIAGNOSIS — R5383 Other fatigue: Secondary | ICD-10-CM

## 2018-06-25 NOTE — Telephone Encounter (Signed)
New Message   Patient is returning call in reference to setting up heart cath. Please call to discuss.

## 2018-06-25 NOTE — Telephone Encounter (Signed)
-----   Message from Minus Breeding, MD sent at 06/23/2018  5:23 PM EST ----- I was finally able to contact this patient.  He has an abnormal POET (Plain Old Exercise Treadmill) which was "high risk" with ST changes at six minutes.  Therefore cardiac cath is indicated. The patient understands that risks included but are not limited to stroke (1 in 1000), death (1 in 56), kidney failure [usually temporary] (1 in 500), bleeding (1 in 200), allergic reaction [possibly serious] (1 in 200).  The patient understands and agrees to proceed.   Please call him to arrange.  Send results to [name], MD

## 2018-06-25 NOTE — Telephone Encounter (Signed)
Spoke to patient.  He states she is ready to set up heart cath. Patient states he is not availalbe the week of Jan 27 ,2020 , or Feb 5,6 or 12.  Patient states he will be at his appointment on JAN 27.  PATIENT AWARE WILL DEFER TO [facility] schedule procedure

## 2018-06-25 NOTE — Telephone Encounter (Signed)
 @  [facility] [address], [place] [address] Dept: [phone] Loc: [place]  06/25/2018  You are scheduled for a Cardiac Catheterization on Monday, February 3 with Dr. Harrell Gave End.  1. Please arrive at the [facility] (Main Entrance A) at [facility]: [address], [place] [address] at 7:00 AM (This time is two hours before your procedure to ensure your preparation). Free valet parking service is available.   Special note: Every effort is made to have your procedure done on time. Please understand that emergencies sometimes delay scheduled procedures.  2. Diet: Do not eat solid foods after midnight.  The patient may have clear liquids until 5am upon the day of the procedure.  3. Labs: You will need to have blood drawn on Wednesday, January 29 at [place]  Open: [place] (Lunch 12:30 - 1:30)   Phone: [phone]. You do not need to be fasting.  4. Medication instructions in preparation for your procedure:   Contrast Allergy: No   On the morning of your procedure, take your morning medicines NOT listed above.  You may use sips of water.  5. Plan for one night stay--bring personal belongings. 6. Bring a current list of your medications and current insurance cards. 7. You MUST have a responsible person to drive you home. 8. Someone MUST be with you the first 24 hours after you arrive home or your discharge will be delayed. 9. Please wear clothes that are easy to get on and off and wear slip-on shoes.  Thank you for allowing [place] to care for you!   -- [facility]

## 2018-06-26 DIAGNOSIS — R9439 Abnormal result of other cardiovascular function study: Secondary | ICD-10-CM | POA: Diagnosis not present

## 2018-06-26 DIAGNOSIS — R5383 Other fatigue: Secondary | ICD-10-CM | POA: Diagnosis not present

## 2018-06-26 DIAGNOSIS — Z01812 Encounter for preprocedural laboratory examination: Secondary | ICD-10-CM | POA: Diagnosis not present

## 2018-06-26 LAB — BASIC METABOLIC PANEL
BUN/Creatinine Ratio: 17 (ref 10–24)
BUN: 15 mg/dL (ref 8–27)
CALCIUM: 9.6 mg/dL (ref 8.6–10.2)
CHLORIDE: 99 mmol/L (ref 96–106)
CO2: 22 mmol/L (ref 20–29)
Creatinine, Ser: 0.86 mg/dL (ref 0.76–1.27)
GFR calc Af Amer: 97 mL/min/{1.73_m2} (ref >59)
GFR, EST NON AFRICAN AMERICAN: 84 mL/min/{1.73_m2} (ref >59)
GLUCOSE: 58 mg/dL — AB (ref 65–99)
POTASSIUM: 5 mmol/L (ref 3.5–5.2)
SODIUM: 136 mmol/L (ref 134–144)

## 2018-06-26 LAB — CBC
Hematocrit: 39 % (ref 37.5–51.0)
Hemoglobin: 13.7 g/dL (ref 13.0–17.7)
MCH: 36 pg — ABNORMAL HIGH (ref 26.6–33.0)
MCHC: 35.1 g/dL (ref 31.5–35.7)
MCV: 102 fL — ABNORMAL HIGH (ref 79–97)
PLATELETS: 185 10*3/uL (ref 150–450)
RBC: 3.81 x10E6/uL — AB (ref 4.14–5.80)
RDW: 13 % (ref 11.6–15.4)
WBC: 5.8 10*3/uL (ref 3.4–10.8)

## 2018-06-26 LAB — TSH: TSH: 2.41 u[IU]/mL (ref 0.450–4.500)

## 2018-06-27 NOTE — H&P (View-Only) (Signed)
Cardiology Office Note   Date:  06/29/2018   ID:  Brian Terry, DOB 1940/08/17, MRN [id number]  PCP:  [name], MD  Cardiologist:   No primary care provider on file. Referring:  [name], MD   Chief Complaint  Patient presents with  . Abnormal Stress Test      History of Present Illness: Brian Terry is a 78 y.o. male who is referred by [name], MD for evaluation of coronary artery calcium seen on CT.   CT of the abdomen demonstratede xtensive coronary artery calcifications and abdominal aortic calcifications.  I sent him for a POET (Plain Old Exercise Treadmill) which demonstrated ST depression suggestive of ischemia.  He is being referred for a cardiac cath.    He came today to discuss many questions that he has.  He is had no new symptoms. The patient denies any new symptoms such as chest discomfort, neck or arm discomfort. There has been no new shortness of breath, PND or orthopnea. There have been no reported palpitations, presyncope or syncope.      Past Medical History:  Diagnosis Date  . GERD (gastroesophageal reflux disease)   . Glaucoma   . Hemorrhoids   . Hyperlipidemia     Past Surgical History:  Procedure Laterality Date  . BALLOON DILATION N/A 03/01/2015   Procedure: BALLOON DILATION;  Surgeon: [name], MD;  Location: [facility];  Service: Endoscopy;  Laterality: N/A;  . CATARACT EXTRACTION    . ESOPHAGOGASTRODUODENOSCOPY N/A 03/01/2015   Procedure: ESOPHAGOGASTRODUODENOSCOPY (EGD);  Surgeon: [name], MD;  Location: [facility];  Service: Endoscopy;  Laterality: N/A;  . HERNIA REPAIR Left 2010   inguinal   . RETINAL DETACHMENT SURGERY Right 2000  . vein removal Right    R leg     Current Outpatient Medications  Medication Sig Dispense Refill  . acetaminophen (TYLENOL) 650 MG CR tablet Take 1,300-1,950 mg by mouth daily.     . carbamide peroxide (DEBROX) 6.5 % OTIC solution Place 5 drops into the right ear daily as  needed (for earwax build up).    . famotidine ([employer] ACID CONTROLLER MAX ST) 20 MG tablet Take 20 mg by mouth daily.    . Multiple Vitamin (MULTIVITAMIN WITH MINERALS) TABS tablet Take 1 tablet by mouth daily. Solgar Multiple vitamins with chelated minerals    . Psyllium (METAMUCIL PO) Take 2-4 capsules by mouth daily.     . tamsulosin (FLOMAX) 0.4 MG CAPS capsule Take 0.4 mg by mouth daily after supper. 30 minutes after supper.    . timolol (TIMOPTIC) 0.5 % ophthalmic solution Place 1 drop into both eyes 2 (two) times daily.   98  . rosuvastatin (CRESTOR) 20 MG tablet Take 1 tablet (20 mg total) by mouth daily. 90 tablet 3   Current Facility-Administered Medications  Medication Dose Route Frequency Provider Last Rate Last Dose  . 0.9 %  sodium chloride infusion  500 mL Intravenous Continuous Danis, Estill Cotta III, MD        Allergies:   Sulfa antibiotics    ROS:  Please see the history of present illness.   Otherwise, review of systems are positive for none.   All other systems are reviewed and negative.    PHYSICAL EXAM: VS:  BP 118/74   Pulse 73   Ht 5' 10.5" (1.791 m)   Wt 155 lb 9.6 oz (70.6 kg)   BMI 22.01 kg/m  , BMI Body mass index is 22.01 kg/m.  GENERAL:  Well appearing NECK:  No jugular venous distention, waveform within normal limits, carotid upstroke brisk and symmetric, no bruits, no thyromegaly LUNGS:  Clear to auscultation bilaterally CHEST:  Unremarkable HEART:  PMI not displaced or sustained,S1 and S2 within normal limits, no S3, no S4, no clicks, no rubs, no murmurs ABD:  Flat, positive bowel sounds normal in frequency in pitch, no bruits, no rebound, no guarding, no midline pulsatile mass, no hepatomegaly, no splenomegaly EXT:  2 plus pulses throughout, no edema, no cyanosis no clubbing     EKG:  EKG is not ordered today.    Recent Labs: 02/15/2018: ALT 25 06/26/2018: BUN 15; Creatinine, Ser 0.86; Hemoglobin 13.7; Platelets 185; Potassium 5.0; Sodium 136; TSH  2.410     Wt Readings from Last 3 Encounters:  06/29/18 155 lb 9.6 oz (70.6 kg)  06/12/18 155 lb 6.4 oz (70.5 kg)  10/09/17 163 lb (73.9 kg)      Other studies Reviewed: Additional studies/ records that were reviewed today include: POET (Plain Old Exercise Treadmill) Review of the above records demonstrates:  See elsewhere   ASSESSMENT AND PLAN:  CORONARY ARTERY CALCIFICATION:    He now has an abnormal stress test which is high risk.  Given this cardiac cath is indicated. The patient understands that risks included but are not limited to stroke (1 in 1000), death (1 in 59), kidney failure [usually temporary] (1 in 500), bleeding (1 in 200), allergic reaction [possibly serious] (1 in 200).  The patient understands and agrees to proceed.  He is instructed to start taking an aspirin.  AORTIC ATHEROSCLEROSIS:   Continue with aggressive risk reduction.  He will continue with risk reduction as below.  DYSLIPIDEMIA:   LDL was 105.  I am going to stop his pravastatin and start Crestor when he milligrams daily.  He will be given instructions to come back and get a lipid profile in about 8 weeks.    Current medicines are reviewed at length with the patient today.  The patient does not have concerns regarding medicines.  The following changes have been made:  See above  Labs/ tests ordered today include:    Orders Placed This Encounter  Procedures  . Lipid panel  . Hepatic function panel     Disposition:   FU with me after the cath   Signed, Minus Breeding, MD  06/29/2018 8:52 AM    [place]

## 2018-06-27 NOTE — Progress Notes (Signed)
Cardiology Office Note   Date:  06/29/2018   ID:  Brian Terry, DOB September 02, 1940, MRN [id number]  PCP:  [name], MD  Cardiologist:   No primary care provider on file. Referring:  [name], MD   Chief Complaint  Patient presents with  . Abnormal Stress Test      History of Present Illness: Brian Terry is a 78 y.o. male who is referred by [name], MD for evaluation of coronary artery calcium seen on CT.   CT of the abdomen demonstratede xtensive coronary artery calcifications and abdominal aortic calcifications.  I sent him for a POET (Plain Old Exercise Treadmill) which demonstrated ST depression suggestive of ischemia.  He is being referred for a cardiac cath.    He came today to discuss many questions that he has.  He is had no new symptoms. The patient denies any new symptoms such as chest discomfort, neck or arm discomfort. There has been no new shortness of breath, PND or orthopnea. There have been no reported palpitations, presyncope or syncope.      Past Medical History:  Diagnosis Date  . GERD (gastroesophageal reflux disease)   . Glaucoma   . Hemorrhoids   . Hyperlipidemia     Past Surgical History:  Procedure Laterality Date  . BALLOON DILATION N/A 03/01/2015   Procedure: BALLOON DILATION;  Surgeon: [name], MD;  Location: [facility];  Service: Endoscopy;  Laterality: N/A;  . CATARACT EXTRACTION    . ESOPHAGOGASTRODUODENOSCOPY N/A 03/01/2015   Procedure: ESOPHAGOGASTRODUODENOSCOPY (EGD);  Surgeon: [name], MD;  Location: [facility];  Service: Endoscopy;  Laterality: N/A;  . HERNIA REPAIR Left 2010   inguinal   . RETINAL DETACHMENT SURGERY Right 2000  . vein removal Right    R leg     Current Outpatient Medications  Medication Sig Dispense Refill  . acetaminophen (TYLENOL) 650 MG CR tablet Take 1,300-1,950 mg by mouth daily.     . carbamide peroxide (DEBROX) 6.5 % OTIC solution Place 5 drops into the right ear daily as  needed (for earwax build up).    . famotidine ([employer] ACID CONTROLLER MAX ST) 20 MG tablet Take 20 mg by mouth daily.    . Multiple Vitamin (MULTIVITAMIN WITH MINERALS) TABS tablet Take 1 tablet by mouth daily. Solgar Multiple vitamins with chelated minerals    . Psyllium (METAMUCIL PO) Take 2-4 capsules by mouth daily.     . tamsulosin (FLOMAX) 0.4 MG CAPS capsule Take 0.4 mg by mouth daily after supper. 30 minutes after supper.    . timolol (TIMOPTIC) 0.5 % ophthalmic solution Place 1 drop into both eyes 2 (two) times daily.   98  . rosuvastatin (CRESTOR) 20 MG tablet Take 1 tablet (20 mg total) by mouth daily. 90 tablet 3   Current Facility-Administered Medications  Medication Dose Route Frequency Provider Last Rate Last Dose  . 0.9 %  sodium chloride infusion  500 mL Intravenous Continuous Danis, Estill Cotta III, MD        Allergies:   Sulfa antibiotics    ROS:  Please see the history of present illness.   Otherwise, review of systems are positive for none.   All other systems are reviewed and negative.    PHYSICAL EXAM: VS:  BP 118/74   Pulse 73   Ht 5' 10.5" (1.791 m)   Wt 155 lb 9.6 oz (70.6 kg)   BMI 22.01 kg/m  , BMI Body mass index is 22.01 kg/m.  GENERAL:  Well appearing NECK:  No jugular venous distention, waveform within normal limits, carotid upstroke brisk and symmetric, no bruits, no thyromegaly LUNGS:  Clear to auscultation bilaterally CHEST:  Unremarkable HEART:  PMI not displaced or sustained,S1 and S2 within normal limits, no S3, no S4, no clicks, no rubs, no murmurs ABD:  Flat, positive bowel sounds normal in frequency in pitch, no bruits, no rebound, no guarding, no midline pulsatile mass, no hepatomegaly, no splenomegaly EXT:  2 plus pulses throughout, no edema, no cyanosis no clubbing     EKG:  EKG is not ordered today.    Recent Labs: 02/15/2018: ALT 25 06/26/2018: BUN 15; Creatinine, Ser 0.86; Hemoglobin 13.7; Platelets 185; Potassium 5.0; Sodium 136; TSH  2.410     Wt Readings from Last 3 Encounters:  06/29/18 155 lb 9.6 oz (70.6 kg)  06/12/18 155 lb 6.4 oz (70.5 kg)  10/09/17 163 lb (73.9 kg)      Other studies Reviewed: Additional studies/ records that were reviewed today include: POET (Plain Old Exercise Treadmill) Review of the above records demonstrates:  See elsewhere   ASSESSMENT AND PLAN:  CORONARY ARTERY CALCIFICATION:    He now has an abnormal stress test which is high risk.  Given this cardiac cath is indicated. The patient understands that risks included but are not limited to stroke (1 in 1000), death (1 in 62), kidney failure [usually temporary] (1 in 500), bleeding (1 in 200), allergic reaction [possibly serious] (1 in 200).  The patient understands and agrees to proceed.  He is instructed to start taking an aspirin.  AORTIC ATHEROSCLEROSIS:   Continue with aggressive risk reduction.  He will continue with risk reduction as below.  DYSLIPIDEMIA:   LDL was 105.  I am going to stop his pravastatin and start Crestor when he milligrams daily.  He will be given instructions to come back and get a lipid profile in about 8 weeks.    Current medicines are reviewed at length with the patient today.  The patient does not have concerns regarding medicines.  The following changes have been made:  See above  Labs/ tests ordered today include:    Orders Placed This Encounter  Procedures  . Lipid panel  . Hepatic function panel     Disposition:   FU with me after the cath   Signed, Minus Breeding, MD  06/29/2018 8:52 AM    [place]

## 2018-06-29 ENCOUNTER — Encounter: Payer: Self-pay | Admitting: Cardiology

## 2018-06-29 ENCOUNTER — Ambulatory Visit: Payer: Medicare Other | Admitting: Cardiology

## 2018-06-29 VITALS — BP 118/74 | HR 73 | Ht 70.5 in | Wt 155.6 lb

## 2018-06-29 DIAGNOSIS — R9439 Abnormal result of other cardiovascular function study: Secondary | ICD-10-CM | POA: Diagnosis not present

## 2018-06-29 DIAGNOSIS — E785 Hyperlipidemia, unspecified: Secondary | ICD-10-CM

## 2018-06-29 DIAGNOSIS — R931 Abnormal findings on diagnostic imaging of heart and coronary circulation: Secondary | ICD-10-CM

## 2018-06-29 DIAGNOSIS — Z79899 Other long term (current) drug therapy: Secondary | ICD-10-CM | POA: Diagnosis not present

## 2018-06-29 MED ORDER — ROSUVASTATIN CALCIUM 20 MG PO TABS: 20.0000 mg | ORAL_TABLET | Freq: Every day | ORAL | 3 refills | Status: DC

## 2018-06-29 NOTE — Patient Instructions (Addendum)
Medication Instructions:  START- Aspirin 81 mg daily STOP- Pravastatin START- Crestor 20 mg daily  If you need a refill on your cardiac medications before your next appointment, please call your pharmacy.  Labwork: Fasting Lipids in 10 weeks  Take the provided lab slips with you to the lab for your blood draw.   When you have your labs (blood work) drawn today and your tests are completely normal, you will receive your results only by MyChart Message (if you have MyChart) -OR-  A paper copy in the mail.  If you have any lab test that is abnormal or we need to change your treatment, we will call you to review these results.  Testing/Procedures: None Ordered   Follow-Up: . You will need a follow up appointment in After Heart Cath   At [facility], you and your health needs are our priority.  As part of our continuing mission to provide you with exceptional heart care, we have created designated Provider Care Teams.  These Care Teams include your primary Cardiologist (physician) and Advanced Practice Providers (APPs -  Physician Assistants and Nurse Practitioners) who all work together to provide you with the care you need, when you need it.  Thank you for choosing [facility] at [facility]!!

## 2018-07-01 ENCOUNTER — Telehealth: Payer: Self-pay | Admitting: Cardiology

## 2018-07-01 ENCOUNTER — Telehealth: Payer: Self-pay | Admitting: *Deleted

## 2018-07-01 NOTE — Telephone Encounter (Signed)
Error no note needed °

## 2018-07-01 NOTE — Telephone Encounter (Signed)
FRONT DESK CALLED AND INFORMED TRIAGE - PATIENT STATES HE HAD LAB WORK LAST WEEK AND HE DID NOT NEED LABS AGAIN . THE PATIENT LEFT THE OFFICE.  RN REVIEWED PATIENT HAD CBC , BMP  ON 06/26/18.  BUT THERE IS AN ACTIVE LAB NEEDED FOR LIPID , HEPATIC   WILL Defer to Dr Hochrein's CMA to contact patient

## 2018-07-02 ENCOUNTER — Telehealth: Payer: Self-pay | Admitting: *Deleted

## 2018-07-02 NOTE — Telephone Encounter (Signed)
Call and spoke with pt about his blood work, pt voice understanding and thanks

## 2018-07-02 NOTE — Telephone Encounter (Signed)
Pt contacted pre-catheterization scheduled at [facility] for: Monday July 06, 2018 9 AM Verified arrival time and place: [facility] at: 7 AM  No solid food after midnight prior to cath, clear liquids until 5 AM day of procedure. Contrast allergy: no   AM meds can be  taken pre-cath with sip of water including: ASA 81 mg  Confirmed patient has responsible person to drive home post procedure and observe for 24 hours after you arrive home: yes

## 2018-07-06 ENCOUNTER — Other Ambulatory Visit: Payer: Self-pay

## 2018-07-06 ENCOUNTER — Ambulatory Visit (HOSPITAL_COMMUNITY)
Admission: RE | Admit: 2018-07-06 | Discharge: 2018-07-06 | Disposition: A | Discharge status: Home / Self Care | Payer: Medicare Other | Attending: Internal Medicine | Admitting: Internal Medicine

## 2018-07-06 ENCOUNTER — Encounter (HOSPITAL_COMMUNITY): Payer: Self-pay | Admitting: Internal Medicine

## 2018-07-06 ENCOUNTER — Telehealth: Payer: Self-pay | Admitting: Cardiology

## 2018-07-06 ENCOUNTER — Encounter (HOSPITAL_COMMUNITY)
Admission: RE | Disposition: A | Discharge status: Home / Self Care | Payer: Self-pay | Source: Home / Self Care | Attending: Internal Medicine

## 2018-07-06 DIAGNOSIS — Z79899 Other long term (current) drug therapy: Secondary | ICD-10-CM | POA: Diagnosis not present

## 2018-07-06 DIAGNOSIS — H409 Unspecified glaucoma: Secondary | ICD-10-CM | POA: Diagnosis not present

## 2018-07-06 DIAGNOSIS — Z882 Allergy status to sulfonamides status: Secondary | ICD-10-CM | POA: Insufficient documentation

## 2018-07-06 DIAGNOSIS — E785 Hyperlipidemia, unspecified: Secondary | ICD-10-CM | POA: Insufficient documentation

## 2018-07-06 DIAGNOSIS — I2582 Chronic total occlusion of coronary artery: Secondary | ICD-10-CM | POA: Diagnosis not present

## 2018-07-06 DIAGNOSIS — K219 Gastro-esophageal reflux disease without esophagitis: Secondary | ICD-10-CM | POA: Insufficient documentation

## 2018-07-06 DIAGNOSIS — I2510 Atherosclerotic heart disease of native coronary artery without angina pectoris: Secondary | ICD-10-CM | POA: Insufficient documentation

## 2018-07-06 DIAGNOSIS — R9439 Abnormal result of other cardiovascular function study: Secondary | ICD-10-CM | POA: Diagnosis not present

## 2018-07-06 DIAGNOSIS — I2584 Coronary atherosclerosis due to calcified coronary lesion: Secondary | ICD-10-CM | POA: Diagnosis not present

## 2018-07-06 HISTORY — PX: LEFT HEART CATH AND CORONARY ANGIOGRAPHY: CATH118249

## 2018-07-06 SURGERY — LEFT HEART CATH AND CORONARY ANGIOGRAPHY
Anesthesia: LOCAL

## 2018-07-06 MED ORDER — MIDAZOLAM HCL 2 MG/2ML IJ SOLN
INTRAMUSCULAR | Status: DC | PRN
  Administered 2018-07-06: 0.5 mg via INTRAVENOUS

## 2018-07-06 MED ORDER — LIDOCAINE HCL (PF) 1 % IJ SOLN
INTRAMUSCULAR | Status: AC
  Filled 2018-07-06: qty 30

## 2018-07-06 MED ORDER — MIDAZOLAM HCL 2 MG/2ML IJ SOLN
INTRAMUSCULAR | Status: AC
  Filled 2018-07-06: qty 2

## 2018-07-06 MED ORDER — HEPARIN SODIUM (PORCINE) 1000 UNIT/ML IJ SOLN
INTRAMUSCULAR | Status: DC | PRN
  Administered 2018-07-06: 3500 [IU] via INTRAVENOUS

## 2018-07-06 MED ORDER — SODIUM CHLORIDE 0.9 % WEIGHT BASED INFUSION: 1.0000 mL/kg/h | INTRAVENOUS | Status: DC

## 2018-07-06 MED ORDER — HEPARIN (PORCINE) IN NACL 1000-0.9 UT/500ML-% IV SOLN
INTRAVENOUS | Status: DC | PRN
  Administered 2018-07-06 (×2): 500 mL

## 2018-07-06 MED ORDER — ACETAMINOPHEN 325 MG PO TABS: 650.0000 mg | ORAL_TABLET | ORAL | Status: DC | PRN

## 2018-07-06 MED ORDER — ASPIRIN 81 MG PO CHEW: 81.0000 mg | CHEWABLE_TABLET | ORAL | Status: DC

## 2018-07-06 MED ORDER — SODIUM CHLORIDE 0.9% FLUSH: 3.0000 mL | INTRAVENOUS | Status: DC | PRN

## 2018-07-06 MED ORDER — ASPIRIN EC 81 MG PO TBEC: 81.0000 mg | DELAYED_RELEASE_TABLET | Freq: Every day | ORAL | Status: DC

## 2018-07-06 MED ORDER — FENTANYL CITRATE (PF) 100 MCG/2ML IJ SOLN
INTRAMUSCULAR | Status: DC | PRN
  Administered 2018-07-06: 25 ug via INTRAVENOUS

## 2018-07-06 MED ORDER — HEPARIN (PORCINE) IN NACL 1000-0.9 UT/500ML-% IV SOLN
INTRAVENOUS | Status: AC
  Filled 2018-07-06: qty 500

## 2018-07-06 MED ORDER — FENTANYL CITRATE (PF) 100 MCG/2ML IJ SOLN
INTRAMUSCULAR | Status: AC
  Filled 2018-07-06: qty 2

## 2018-07-06 MED ORDER — VERAPAMIL HCL 2.5 MG/ML IV SOLN
INTRAVENOUS | Status: AC
  Filled 2018-07-06: qty 2

## 2018-07-06 MED ORDER — SODIUM CHLORIDE 0.9% FLUSH: 3.0000 mL | Freq: Two times a day (BID) | INTRAVENOUS | Status: DC

## 2018-07-06 MED ORDER — ONDANSETRON HCL 4 MG/2ML IJ SOLN: 4.0000 mg | Freq: Four times a day (QID) | INTRAMUSCULAR | Status: DC | PRN

## 2018-07-06 MED ORDER — SODIUM CHLORIDE 0.9 % IV SOLN: INTRAVENOUS | Status: DC

## 2018-07-06 MED ORDER — LIDOCAINE HCL (PF) 1 % IJ SOLN
INTRAMUSCULAR | Status: DC | PRN
  Administered 2018-07-06: 2 mL

## 2018-07-06 MED ORDER — SODIUM CHLORIDE 0.9 % WEIGHT BASED INFUSION
3.0000 mL/kg/h | INTRAVENOUS | Status: AC
  Administered 2018-07-06: 3 mL/kg/h via INTRAVENOUS

## 2018-07-06 MED ORDER — SODIUM CHLORIDE 0.9 % IV SOLN: 250.0000 mL | INTRAVENOUS | Status: DC | PRN

## 2018-07-06 MED ORDER — NITROGLYCERIN 0.4 MG SL SUBL: 0.4000 mg | SUBLINGUAL_TABLET | SUBLINGUAL | 99 refills | Status: AC | PRN

## 2018-07-06 MED ORDER — VERAPAMIL HCL 2.5 MG/ML IV SOLN
INTRAVENOUS | Status: DC | PRN
  Administered 2018-07-06: 10 mL via INTRA_ARTERIAL

## 2018-07-06 SURGICAL SUPPLY — 9 items
CATH IMPULSE 5F ANG/FL3.5 (CATHETERS) ×1 IMPLANT
DEVICE RAD COMP TR BAND LRG (VASCULAR PRODUCTS) ×2 IMPLANT
GLIDESHEATH SLEND SS 6F .021 (SHEATH) ×1 IMPLANT
GUIDEWIRE INQWIRE 1.5J.035X260 (WIRE) IMPLANT
INQWIRE 1.5J .035X260CM (WIRE) ×2
KIT HEART LEFT (KITS) ×2 IMPLANT
PACK CARDIAC CATHETERIZATION (CUSTOM PROCEDURE TRAY) ×2 IMPLANT
TRANSDUCER W/STOPCOCK (MISCELLANEOUS) ×2 IMPLANT
TUBING CIL FLEX 10 FLL-RA (TUBING) ×2 IMPLANT

## 2018-07-06 NOTE — Interval H&P Note (Signed)
History and Physical Interval Note:  07/06/2018 8:29 AM  Brian Terry  has presented today for cardiac catheterization, with the diagnosis of CAD and abnormal stress test. The various methods of treatment have been discussed with the patient and family. After consideration of risks, benefits and other options for treatment, the patient has consented to  Procedure(s): LEFT HEART CATH AND CORONARY ANGIOGRAPHY (N/A) as a surgical intervention .  The patient's history has been reviewed, patient examined, no change in status, stable for surgery.  I have reviewed the patient's chart and labs.  Questions were answered to the patient's satisfaction.    Cath Lab Visit (complete for each Cath Lab visit)  Clinical Evaluation Leading to the Procedure:   ACS: No.  Non-ACS:    Anginal Classification: CCS II  Anti-ischemic medical therapy: No Therapy  Non-Invasive Test Results: High-risk stress test findings: cardiac mortality >3%/year  Prior CABG: No previous CABG  Brian Terry

## 2018-07-06 NOTE — Discharge Instructions (Signed)
Drink plenty of fluids over next 48 hours and keep right wrist elevated at heart level for 24 hours ° °Radial Site Care ° °This sheet gives you information about how to care for yourself after your procedure. Your health care provider may also give you more specific instructions. If you have problems or questions, contact your health care provider. °What can I expect after the procedure? °After the procedure, it is common to have: °· Bruising and tenderness at the catheter insertion area. °Follow these instructions at home: °Medicines °· Take over-the-counter and prescription medicines only as told by your health care provider. °Insertion site care °· Follow instructions from your health care provider about how to take care of your insertion site. Make sure you: °? Wash your hands with soap and water before you change your bandage (dressing). If soap and water are not available, use hand sanitizer. °? Remove your dressing as told by your health care provider. In 24-48 hours °· Check your insertion site every day for signs of infection. Check for: °? Redness, swelling, or pain. °? Fluid or blood. °? Pus or a bad smell. °? Warmth. °· Do not take baths, swim, or use a hot tub until your health care provider approves. °· You may shower 24-48 hours after the procedure, or as directed by your health care provider. °? Remove the dressing and gently wash the site with plain soap and water. °? Pat the area dry with a clean towel. °? Do not rub the site. That could cause bleeding. °· Do not apply powder or lotion to the site. °Activity ° °· For 24 hours after the procedure, or as directed by your health care provider: °? Do not flex or bend the affected arm. °? Do not push or pull heavy objects with the affected arm. °? Do not drive yourself home from the hospital or clinic. You may drive 24 hours after the procedure unless your health care provider tells you not to. °? Do not operate machinery or power tools. °· Do not lift  anything that is heavier than 10 lb (4.5 kg), or the limit that you are told, until your health care provider says that it is safe. For 5 days °· Ask your health care provider when it is okay to: °? Return to work or school. °? Resume usual physical activities or sports. °? Resume sexual activity. °General instructions °· If the catheter site starts to bleed, raise your arm and put firm pressure on the site. If the bleeding does not stop, get help right away. This is a medical emergency. °· If you went home on the same day as your procedure, a responsible adult should be with you for the first 24 hours after you arrive home. °· Keep all follow-up visits as told by your health care provider. This is important. °Contact a health care provider if: °· You have a fever. °· You have redness, swelling, or yellow drainage around your insertion site. °Get help right away if: °· You have unusual pain at the radial site. °· The catheter insertion area swells very fast. °· The insertion area is bleeding, and the bleeding does not stop when you hold steady pressure on the area. °· Your arm or hand becomes pale, cool, tingly, or numb. °These symptoms may represent a serious problem that is an emergency. Do not wait to see if the symptoms will go away. Get medical help right away. Call your local emergency services (911 in the [place] Do not   drive yourself to the hospital. °Summary °· After the procedure, it is common to have bruising and tenderness at the site. °· Follow instructions from your health care provider about how to take care of your radial site wound. Check the wound every day for signs of infection. °· Do not lift anything that is heavier than 10 lb (4.5 kg), or the limit that you are told, until your health care provider says that it is safe. °This information is not intended to replace advice given to you by your health care provider. Make sure you discuss any questions you have with your health care  provider. °Document Released: 06/22/2010 Document Revised: 06/25/2017 Document Reviewed: 06/25/2017 °Elsevier Interactive Patient Education © 2019 [employer] ° °

## 2018-07-06 NOTE — Telephone Encounter (Signed)
  Patient's wife is calling because pt had heart cath earlier today and he is having some swelling in his left hand. Mrs Clymer wanted to make sure this wasn't something that they should be concerned about. She stated that the incision site is doing fine, no issues there.

## 2018-07-06 NOTE — Telephone Encounter (Signed)
Returned call to pt wife. Pt had a cardiac cath today, right radial approach. The pt has some swelling in his right hand. The site is dry and intact. There is no discoloration Adv her that swelling same day is to be expected. Adv her that he needs to limit use and elevate the arm for the next couple of days. They are to call back if if the swelling worsens, any sign of infection, site becomes painful to touch. Pt wife voiced appreciation and voiced appreciation for the call back.

## 2018-07-06 NOTE — Brief Op Note (Signed)
BRIEF CARDIAC CATHETERIZATION NOTE  DATE: 07/06/2018 TIME: 9:23 AM  PATIENT:  Sheryle Spray  78 y.o. male  PRE-OPERATIVE DIAGNOSIS:  Abnormal stress test and coronary artery calcification  POST-OPERATIVE DIAGNOSIS:  3-vessel CAD  PROCEDURE:  Procedure(s): LEFT HEART CATH AND CORONARY ANGIOGRAPHY (N/A)  SURGEON:  Surgeon(s) and Role:    * Sharmane Dame, MD - Primary  FINDINGS: 1. 3-vessel CAD, including 60% distal LMCA, 70% mid LAD, 80-90% ostial LCx, and CTO mid RCA. 2. Normal LVEF and LVEDP.  RECOMMENDATIONS: 1. Outpatient cardiac surgery consultation. 2. Start ASA 81 mg daily; NTG 0.4 mg SL q50min prn chest pain. 3. Aggressive secondary prevention.  [name], MD [facility] Pager: [phone]

## 2018-07-09 ENCOUNTER — Other Ambulatory Visit: Payer: Self-pay

## 2018-07-09 ENCOUNTER — Encounter: Payer: Self-pay | Admitting: Surgery

## 2018-07-09 ENCOUNTER — Encounter (HOSPITAL_COMMUNITY): Payer: Self-pay | Admitting: Emergency Medicine

## 2018-07-09 ENCOUNTER — Emergency Department (HOSPITAL_COMMUNITY): Payer: Medicare Other

## 2018-07-09 ENCOUNTER — Institutional Professional Consult (permissible substitution): Payer: Medicare Other | Admitting: Surgery

## 2018-07-09 ENCOUNTER — Other Ambulatory Visit: Payer: Self-pay | Admitting: *Deleted

## 2018-07-09 ENCOUNTER — Emergency Department (HOSPITAL_COMMUNITY)
Admission: EM | Admit: 2018-07-09 | Discharge: 2018-07-09 | Disposition: A | Discharge status: Home / Self Care | Payer: Medicare Other | Attending: Emergency Medicine | Admitting: Emergency Medicine

## 2018-07-09 VITALS — BP 128/80 | HR 71 | Resp 20 | Ht 70.5 in | Wt 158.0 lb

## 2018-07-09 DIAGNOSIS — Z79899 Other long term (current) drug therapy: Secondary | ICD-10-CM | POA: Diagnosis not present

## 2018-07-09 DIAGNOSIS — R0902 Hypoxemia: Secondary | ICD-10-CM | POA: Diagnosis not present

## 2018-07-09 DIAGNOSIS — I959 Hypotension, unspecified: Secondary | ICD-10-CM | POA: Diagnosis not present

## 2018-07-09 DIAGNOSIS — R55 Syncope and collapse: Secondary | ICD-10-CM

## 2018-07-09 DIAGNOSIS — Z7982 Long term (current) use of aspirin: Secondary | ICD-10-CM | POA: Insufficient documentation

## 2018-07-09 DIAGNOSIS — I2510 Atherosclerotic heart disease of native coronary artery without angina pectoris: Secondary | ICD-10-CM

## 2018-07-09 DIAGNOSIS — R42 Dizziness and giddiness: Secondary | ICD-10-CM | POA: Diagnosis not present

## 2018-07-09 LAB — CBC WITH DIFFERENTIAL/PLATELET
Abs Immature Granulocytes: 0.03 10*3/uL (ref 0.00–0.07)
Basophils Absolute: 0 10*3/uL (ref 0.0–0.1)
Basophils Relative: 0 %
Eosinophils Absolute: 0.5 10*3/uL (ref 0.0–0.5)
Eosinophils Relative: 6 %
HCT: 35.2 % — ABNORMAL LOW (ref 39.0–52.0)
Hemoglobin: 11.3 g/dL — ABNORMAL LOW (ref 13.0–17.0)
Immature Granulocytes: 0 %
LYMPHS PCT: 21 %
Lymphs Abs: 1.6 10*3/uL (ref 0.7–4.0)
MCH: 33.2 pg (ref 26.0–34.0)
MCHC: 32.1 g/dL (ref 30.0–36.0)
MCV: 103.5 fL — ABNORMAL HIGH (ref 80.0–100.0)
Monocytes Absolute: 0.6 10*3/uL (ref 0.1–1.0)
Monocytes Relative: 7 %
Neutro Abs: 5 10*3/uL (ref 1.7–7.7)
Neutrophils Relative %: 66 %
Platelets: 124 10*3/uL — ABNORMAL LOW (ref 150–400)
RBC: 3.4 MIL/uL — AB (ref 4.22–5.81)
RDW: 12.9 % (ref 11.5–15.5)
WBC: 7.8 10*3/uL (ref 4.0–10.5)
nRBC: 0 % (ref 0.0–0.2)

## 2018-07-09 LAB — COMPREHENSIVE METABOLIC PANEL
ALT: 17 U/L (ref 0–44)
AST: 22 U/L (ref 15–41)
Albumin: 2.9 g/dL — ABNORMAL LOW (ref 3.5–5.0)
Alkaline Phosphatase: 37 U/L — ABNORMAL LOW (ref 38–126)
Anion gap: 6 (ref 5–15)
BILIRUBIN TOTAL: 1.3 mg/dL — AB (ref 0.3–1.2)
BUN: 12 mg/dL (ref 8–23)
CO2: 24 mmol/L (ref 22–32)
Calcium: 7.8 mg/dL — ABNORMAL LOW (ref 8.9–10.3)
Chloride: 112 mmol/L — ABNORMAL HIGH (ref 98–111)
Creatinine, Ser: 0.86 mg/dL (ref 0.61–1.24)
GFR calc Af Amer: >60 mL/min (ref >60)
GFR calc non Af Amer: >60 mL/min (ref >60)
Glucose, Bld: 115 mg/dL — ABNORMAL HIGH (ref 70–99)
Potassium: 3.7 mmol/L (ref 3.5–5.1)
Sodium: 142 mmol/L (ref 135–145)
Total Protein: 4.9 g/dL — ABNORMAL LOW (ref 6.5–8.1)

## 2018-07-09 LAB — I-STAT TROPONIN, ED: Troponin i, poc: 0.01 ng/mL (ref 0.00–0.08)

## 2018-07-09 LAB — POC OCCULT BLOOD, ED: Fecal Occult Bld: NEGATIVE

## 2018-07-09 MED ORDER — SODIUM CHLORIDE 0.9 % IV BOLUS
1000.0000 mL | Freq: Once | INTRAVENOUS | Status: AC
  Administered 2018-07-09: 1000 mL via INTRAVENOUS

## 2018-07-09 NOTE — ED Notes (Signed)
Patient ambulated to restroom with stand-by assist. Denies any dizziness.

## 2018-07-09 NOTE — Consult Note (Signed)
Cardiology Consultation:   Patient ID: Brian Terry MRN: [id number]; DOB: 07/22/1940  Admit date: 07/09/2018 Date of Consult: 07/09/2018  Primary Care Provider: [name], MD Primary Cardiologist: Hochrein  Primary Electrophysiologist:  None   Patient Profile:   Brian Terry is a 78 y.o. male with a hx of CAD who is being seen today for the evaluation of syncope at the request of Margarita Mail PA.  History of Present Illness:   Brian Terry 78 y.o. with HLD. Recent cath by Dr End 2/3 with severe 3 vessel disease Normal EF. Referred to Dr Cyndia Bent as outpatient for CABG evaluation To see him today at 3:45 This am awoke with malaise Got dizzy in bathroom sitting on toilet and felt presyncopal Wife was concerned that he was having a heart attack and gave him nitro which made it worse No chest pain dyspnea or palpitations. In ER has been fine. Not orthostatic HR in 60 's with no AV block Not on any AV notal blocking drugs ECG is normal with no ischemic changes CXR NAD. He has not had fever Troponin negative Hct a bit lower than pre cath 35.2 but no blood in stool on rectal exam.   Past Medical History:  Diagnosis Date  . GERD (gastroesophageal reflux disease)   . Glaucoma   . Hemorrhoids   . Hyperlipidemia     Past Surgical History:  Procedure Laterality Date  . BALLOON DILATION N/A 03/01/2015   Procedure: BALLOON DILATION;  Surgeon: [name], MD;  Location: [facility];  Service: Endoscopy;  Laterality: N/A;  . CATARACT EXTRACTION    . ESOPHAGOGASTRODUODENOSCOPY N/A 03/01/2015   Procedure: ESOPHAGOGASTRODUODENOSCOPY (EGD);  Surgeon: [name], MD;  Location: [facility];  Service: Endoscopy;  Laterality: N/A;  . HERNIA REPAIR Left 2010   inguinal   . LEFT HEART CATH AND CORONARY ANGIOGRAPHY N/A 07/06/2018   Procedure: LEFT HEART CATH AND CORONARY ANGIOGRAPHY;  Surgeon: [name], MD;  Location: [facility];  Service: Cardiovascular;  Laterality: N/A;    . RETINAL DETACHMENT SURGERY Right 2000  . vein removal Right    R leg     Home Medications:  Prior to Admission medications   Medication Sig Start Date End Date Taking? Authorizing Provider  acetaminophen (TYLENOL) 650 MG CR tablet Take 1,300-1,950 mg by mouth daily.     [provider]  aspirin EC 81 MG tablet Take 1 tablet (81 mg total) by mouth daily. 07/06/18   End, Harrell Gave, MD  carbamide peroxide (DEBROX) 6.5 % OTIC solution Place 5 drops into the right ear daily as needed (for earwax build up).    [provider]  famotidine ([employer] ACID CONTROLLER MAX ST) 20 MG tablet Take 20 mg by mouth daily.    [provider]  Multiple Vitamin (MULTIVITAMIN WITH MINERALS) TABS tablet Take 1 tablet by mouth daily. Solgar Multiple vitamins with chelated minerals    [provider]  nitroGLYCERIN (NITROSTAT) 0.4 MG SL tablet Place 1 tablet (0.4 mg total) under the tongue every 5 (five) minutes as needed for chest pain. 07/06/18 07/06/19  End, Harrell Gave, MD  Psyllium (METAMUCIL PO) Take 2-4 capsules by mouth daily.     [provider]  rosuvastatin (CRESTOR) 20 MG tablet Take 1 tablet (20 mg total) by mouth daily. 06/29/18 09/27/18  Minus Breeding, MD  tamsulosin (FLOMAX) 0.4 MG CAPS capsule Take 0.4 mg by mouth daily after supper. 30 minutes after supper.    [provider]  timolol (  TIMOPTIC) 0.5 % ophthalmic solution Place 1 drop into both eyes 2 (two) times daily.  01/27/15   [provider]    Inpatient Medications: Scheduled Meds:  Continuous Infusions: . sodium chloride     PRN Meds:   Allergies:    Allergies  Allergen Reactions  . Sulfa Antibiotics Other (See Comments)    Unknown reaction/ as a child    Social History:   Social History   Socioeconomic History  . Marital status: Married    Spouse name: Not on file  . Number of children: 0  . Years of education: Not on file  . Highest education level: Not on file   Occupational History  . Occupation: Accoumting   Social Needs  . Financial resource strain: Not on file  . Food insecurity:    Worry: Not on file    Inability: Not on file  . Transportation needs:    Medical: Not on file    Non-medical: Not on file  Tobacco Use  . Smoking status: Never Smoker  . Smokeless tobacco: Never Used  Substance and Sexual Activity  . Alcohol use: No    Alcohol/week: 0.0 standard drinks  . Drug use: No  . Sexual activity: Not on file  Lifestyle  . Physical activity:    Days per week: Not on file    Minutes per session: Not on file  . Stress: Not on file  Relationships  . Social connections:    Talks on phone: Not on file    Gets together: Not on file    Attends religious service: Not on file    Active member of club or organization: Not on file    Attends meetings of clubs or organizations: Not on file    Relationship status: Not on file  . Intimate partner violence:    Fear of current or ex partner: Not on file    Emotionally abused: Not on file    Physically abused: Not on file    Forced sexual activity: Not on file  Other Topics Concern  . 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  No children.     Family History:    Family History  Problem Relation Age of Onset  . Heart disease Mother   . Colon polyps Father   . Stomach cancer Father   . Heart disease Brother 43       Stent     ROS:  Please see the history of present illness.   All other ROS reviewed and negative.     Physical Exam/Data:   Vitals:   07/09/18 0815 07/09/18 0830 07/09/18 0845 07/09/18 0915  BP: 93/71 101/76 109/74 114/74  Pulse: (!) 45 (!) 55 (!) 50 (!) 54  Resp: 14 17 15 14   Temp:      TempSrc:      SpO2: 98% 100% 99% 98%  Weight:      Height:        Intake/Output Summary (Last 24 hours) at 07/09/2018 0942 Last data filed at 07/09/2018 0900 Gross per 24 hour  Intake 1000 ml  Output -  Net 1000 ml   Last 3  Weights 07/09/2018 07/06/2018 06/29/2018  Weight (lbs) 153 lb 15.9 oz 154 lb 155 lb 9.6 oz  Weight (kg) 69.85 kg 69.854 kg 70.58 kg     Body mass index is 21.78 kg/m.  General:  Well nourished, well developed, in no acute distress  HEENT: normal Lymph: no adenopathy Neck: no JVD Endocrine:  No thryomegaly Vascular: No carotid bruits; FA pulses 2+ bilaterally without bruits  Cardiac:  normal S1, S2; RRR; no murmur   Lungs:  clear to auscultation bilaterally, no wheezing, rhonchi or rales  Abd: soft, nontender, no hepatomegaly  Ext: no edema Musculoskeletal:  No deformities, BUE and BLE strength normal and equal Skin: warm and dry lots of keratosis on chest/neck  Neuro:  CNs 2-12 intact, no focal abnormalities noted Psych:  Normal affect   EKG:  The EKG was personally reviewed and demonstrates:  NSR normal  Telemetry:  Telemetry was personally reviewed and demonstrates:  NSR no arrhythmia and no AV blocke   Relevant CV Studies: See description of cath 08/01/2018  Laboratory Data:  Chemistry Recent Labs  Lab 07/09/18 0711  NA 142  K 3.7  CL 112*  CO2 24  GLUCOSE 115*  BUN 12  CREATININE 0.86  CALCIUM 7.8*  GFRNONAA >60  GFRAA >60  ANIONGAP 6    Recent Labs  Lab 07/09/18 0711  PROT 4.9*  ALBUMIN 2.9*  AST 22  ALT 17  ALKPHOS 37*  BILITOT 1.3*   Hematology Recent Labs  Lab 07/09/18 0711  WBC 7.8  RBC 3.40*  HGB 11.3*  HCT 35.2*  MCV 103.5*  MCH 33.2  MCHC 32.1  RDW 12.9  PLT 124*   Cardiac EnzymesNo results for input(s): TROPONINI in the last 168 hours.  Recent Labs  Lab 07/09/18 0723  TROPIPOC 0.01    BNPNo results for input(s): BNP, PROBNP in the last 168 hours.  DDimer No results for input(s): DDIMER in the last 168 hours.  Radiology/Studies:  Dg Chest Port 1 View  Result Date: 07/09/2018 CLINICAL DATA:  Syncope EXAM: PORTABLE CHEST 1 VIEW COMPARISON:  12/19/2009 FINDINGS: Normal heart size and mediastinal contours. No acute infiltrate or edema. No  effusion or pneumothorax. Mild thoracic levoscoliosis. No acute osseous findings. Artifact from EKG leads. IMPRESSION: Negative portable chest. Electronically Signed   By: Monte Fantasia M.D.   On: 07/09/2018 07:28    Assessment and Plan:   1. Syncope:  Sounds like vagal episode mae worse by SL nitro given by wife. Seems stable with no postural signs Tends toward bradycardia on no beta blocker Has r/o with no ECG changes and has no chest pain I think it is important for him to keep his appt with surgeon to arrange CABG. Ok to d/c home. Told wife not to give him nitro unless he has chest pain 2. CAD:  Keep appointment with Dr Cyndia Bent for 3:45 today arrange CABG in next week or two  3. Prostate:  Told him to hold his Flomax if he was feeling lightheaded   [facility] will sign off.   Medication Recommendations:  None  Other recommendations (labs, testing, etc):  None Follow up as an outpatient:  Dr Cyndia Bent today   For questions or updates, please contact [facility] Please consult [email or web address] for contact info under     Signed, Jenkins Rouge, MD  07/09/2018 9:42 AM

## 2018-07-09 NOTE — Discharge Instructions (Addendum)
Follow these instructions at home: Pay attention to any changes in your symptoms. Take these actions to help with your condition: Have someone stay with you until you feel stable. Do not drive, use machinery, or play sports until your health care provider says it is okay. Keep all follow-up visits as told by your health care provider. This is important. If you start to feel like you might faint, lie down right away and raise (elevate) your feet above the level of your heart. Breathe deeply and steadily. Wait until all of the symptoms have passed. Drink enough fluid to keep your urine clear or pale yellow. If you are taking blood pressure or heart medicine, get up slowly and take several minutes to sit and then stand. This can reduce dizziness. Take over-the-counter and prescription medicines only as told by your health care provider. Get help right away if: You have a severe headache. You have unusual pain in your chest, abdomen, or back. You are bleeding from your mouth or rectum, or you have black or tarry stool. You have a very fast or irregular heartbeat (palpitations). You faint once or repeatedly. You have a seizure. You are confused. You have trouble walking. You have severe weakness. You have vision problems.
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
## 2018-07-09 NOTE — ED Provider Notes (Signed)
[facility] EMERGENCY DEPARTMENT Provider Note   CSN: [id number] Arrival date & time: 07/09/18  6387     History   Chief Complaint Chief Complaint  Patient presents with  . Near Syncope    HPI Brian Terry is a 77 y.o. male who presents the emergency department chief complaint of syncope.  History is gathered from the patient, EMS and his wife who is at bedside.  Patient reports that he was in the bathroom this morning getting ready.  He washed his hair and brush his teeth.  Apparently he started to feel dizzy and sat down on the toilet and felt as if he was going to pass out and called for his wife.  His wife reports that he was sitting on the toilet leaning up against the sink on his arms and was very pale and gray.  She called 911.  She was concerned that he was having a heart attack and gave him a sublingual nitroglycerin and laid him on the floor.  The patient denies racing skipping palpitations or pain in his chest.  He has a previous history of a syncopal event many years ago which his wife thinks was a reaction to cold medication that he took.  Patient has recent medical history of an abnormal stress test and an cardiac catheterization that showed significant disease requiring repair with CABG.  His wife reports that they were scheduled to meet with Dr. Mohammed Kindle today of CT surgery.  The patient denies use of diuretic, blood thinners, melena, hematochezia, recent volume loss, history of PE or DVT, unilateral leg swelling, fevers chills abdominal pain.  Patient denies any headache or neurologic symptoms.  EMS reports that he was significantly hypotensive upon arrival which they attribute to the nitroglycerin that was given just prior to their assessment.  HPI  Past Medical History:  Diagnosis Date  . GERD (gastroesophageal reflux disease)   . Glaucoma   . Hemorrhoids   . Hyperlipidemia     Patient Active Problem List   Diagnosis Date Noted  . CAD in native  artery 07/06/2018  . Abnormal stress test 06/29/2018  . Medication management 06/29/2018  . Hyperlipidemia 06/29/2018  . Esophageal stricture 03/01/2015  . Duodenitis 03/01/2015  . Dysphagia, pharyngoesophageal phase 02/24/2015  . GILBERT'S SYNDROME 10/22/2006  . ANHIDROSIS 10/22/2006    Past Surgical History:  Procedure Laterality Date  . BALLOON DILATION N/A 03/01/2015   Procedure: BALLOON DILATION;  Surgeon: [name], MD;  Location: [facility];  Service: Endoscopy;  Laterality: N/A;  . CATARACT EXTRACTION    . ESOPHAGOGASTRODUODENOSCOPY N/A 03/01/2015   Procedure: ESOPHAGOGASTRODUODENOSCOPY (EGD);  Surgeon: [name], MD;  Location: [facility];  Service: Endoscopy;  Laterality: N/A;  . HERNIA REPAIR Left 2010   inguinal   . LEFT HEART CATH AND CORONARY ANGIOGRAPHY N/A 07/06/2018   Procedure: LEFT HEART CATH AND CORONARY ANGIOGRAPHY;  Surgeon: [name], MD;  Location: [facility];  Service: Cardiovascular;  Laterality: N/A;  . RETINAL DETACHMENT SURGERY Right 2000  . vein removal Right    R leg        Home Medications    Prior to Admission medications   Medication Sig Start Date End Date Taking? Authorizing Provider  acetaminophen (TYLENOL) 650 MG CR tablet Take 1,300-1,950 mg by mouth daily.     [provider]  aspirin EC 81 MG tablet Take 1 tablet (81 mg total) by mouth daily. 07/06/18   End, Harrell Gave, MD  carbamide  peroxide (DEBROX) 6.5 % OTIC solution Place 5 drops into the right ear daily as needed (for earwax build up).    [provider]  famotidine ([employer] ACID CONTROLLER MAX ST) 20 MG tablet Take 20 mg by mouth daily.    [provider]  Multiple Vitamin (MULTIVITAMIN WITH MINERALS) TABS tablet Take 1 tablet by mouth daily. Solgar Multiple vitamins with chelated minerals    [provider]  nitroGLYCERIN (NITROSTAT) 0.4 MG SL tablet Place 1 tablet (0.4 mg total) under the tongue every 5 (five) minutes as  needed for chest pain. 07/06/18 07/06/19  End, Harrell Gave, MD  Psyllium (METAMUCIL PO) Take 2-4 capsules by mouth daily.     [provider]  rosuvastatin (CRESTOR) 20 MG tablet Take 1 tablet (20 mg total) by mouth daily. 06/29/18 09/27/18  Minus Breeding, MD  tamsulosin (FLOMAX) 0.4 MG CAPS capsule Take 0.4 mg by mouth daily after supper. 30 minutes after supper.    [provider]  timolol (TIMOPTIC) 0.5 % ophthalmic solution Place 1 drop into both eyes 2 (two) times daily.  01/27/15   [provider]    Family History Family History  Problem Relation Age of Onset  . Heart disease Mother   . Colon polyps Father   . Stomach cancer Father   . Heart disease Brother 33       Stent    Social History Social History   Tobacco Use  . Smoking status: Never Smoker  . Smokeless tobacco: Never Used  Substance Use Topics  . Alcohol use: No    Alcohol/week: 0.0 standard drinks  . Drug use: No     Allergies   Sulfa antibiotics   Review of Systems Review of Systems Ten systems reviewed and are negative for acute change, except as noted in the HPI.    Physical Exam Updated Vital Signs BP 98/74   Pulse (!) 54   Temp 98.1 F (36.7 C) (Oral)   Resp 16   Ht 5' 10.5" (1.791 m)   Wt 69.8 kg   SpO2 98%   BMI 21.78 kg/m   Physical Exam Vitals signs and nursing note reviewed.  Constitutional:      General: He is not in acute distress.    Appearance: He is well-developed. He is not diaphoretic.  HENT:     Head: Normocephalic and atraumatic.  Eyes:     General: No scleral icterus.    Extraocular Movements: Extraocular movements intact.     Conjunctiva/sclera: Conjunctivae normal.     Pupils: Pupils are equal, round, and reactive to light.  Neck:     Musculoskeletal: Normal range of motion and neck supple.  Cardiovascular:     Rate and Rhythm: Normal rate and regular rhythm.     Heart sounds: Normal heart sounds.  Pulmonary:     Effort: Pulmonary  effort is normal. No respiratory distress.     Breath sounds: Normal breath sounds.  Abdominal:     Palpations: Abdomen is soft.     Tenderness: There is no abdominal tenderness.  Skin:    General: Skin is warm and dry.  Neurological:     Mental Status: He is alert.  Psychiatric:        Behavior: Behavior normal.      ED Treatments / Results  Labs (all labs ordered are listed, but only abnormal results are displayed) Labs Reviewed  CBC WITH DIFFERENTIAL/PLATELET  COMPREHENSIVE METABOLIC PANEL  I-STAT TROPONIN, ED    EKG EKG  Interpretation  Date/Time:  Thursday July 09 2018 07:05:38 EST Ventricular Rate:  50 PR Interval:    QRS Duration: 115 QT Interval:  433 QTC Calculation: 395 R Axis:   11 Text Interpretation:  Sinus rhythm Nonspecific intraventricular conduction delay Borderline ST elevation, anterior leads No significant change since last tracing Confirmed by Wandra Arthurs [phone]) on 07/09/2018 7:09:40 AM   Radiology No results found.  Procedures Procedures (including critical care time)  Medications Ordered in ED Medications  sodium chloride 0.9 % bolus 1,000 mL (has no administration in time range)     Initial Impression / Assessment and Plan / ED Course  I have reviewed the triage vital signs and the nursing notes.  Pertinent labs & imaging results that were available during my care of the patient were reviewed by me and considered in my medical decision making (see chart for details).  Clinical Course as of Jul 09 917  Thu Jul 09, 2018  0738 Troponin i, poc: 0.01 [AH]  0738 Platelets(!): 124 [AH]  [phone] Patient with 3 g drop in his hemoglobin down from 13 8 days ago to -11.3   Hemoglobin(!): 11.3 [AH]    Clinical Course User Index [AH] Margarita Mail, PA-C    Patient with syncopal event.  His blood pressure was decreased for a significant amount of time secondary to giving nitroglycerin.  Patient appears markedly improved.  His orthostatics  were positive.  Given fluids with improvement in symptoms. he states that his blood pressure is normally low.  Patient was seen here in the emergency department by Dr. Johnsie Cancel.  I personally reviewed the patient's portable chest x-ray which shows no acute abnormalities.  EKG is unchanged from previous.  Dr. Johnsie Cancel recommends discharge.  Patient has an established appointment today with CT surgery.  He is advised to return for any repeat episodes of syncope, near syncope, chest pain, shortness of breath or palpitations.  There was a slight drop in his hemoglobin however he is fecal occult negative and this may be secondary to his recent catheterization.  Patient appears otherwise appropriate for discharge, his lab work is stable.  Final Clinical Impressions(s) / ED Diagnoses   Final diagnoses:  Near syncope    ED Discharge Orders    None       Margarita Mail, PA-C 07/10/18 1010    Drenda Freeze, MD 07/11/18 [phone]

## 2018-07-09 NOTE — ED Triage Notes (Signed)
  Patient BIB EMS after near syncopal episode at home.  Patient had just gotten out of the shower and was drying off when he felt dizzy and started to pass out.  Patient was able to sit down and did not have LOC.  Patient was given 1 nitro by his wife and patient had another near syncopal episode when EMS arrived.  Patient was diagnosed with 3 cardiac blockages on Monday and was to see surgeon this morning about scheduling his catheterization.  Patient is A&O x4.  No pain.

## 2018-07-10 ENCOUNTER — Encounter: Payer: Self-pay | Admitting: *Deleted

## 2018-07-13 NOTE — Pre-Procedure Instructions (Signed)
Brian Terry  07/13/2018      [place], [place] [address] Phone: [phone] Fax: [phone]    Your procedure is scheduled on Thursday, February 13th.  Report to [facility] Admitting at 5:30 A.M.  Call this number if you have problems the morning of surgery:  [phone]   Remember:  Do not eat or drink after midnight.    Take these medicines the morning of surgery with A SIP OF WATER  acetaminophen (TYLENOL)  famotidine  nitroGLYCERIN (NITROSTAT)-take if needed. Please let nurse know if had to use this.   As of today, STOP taking any Aleve, Naproxen, Ibuprofen, Motrin, Advil, Goody's, BC's, all herbal medications, fish oil, and all vitamins.    Do not wear jewelry.  Do not wear lotions, powders, or colognes, or deodorant.  Men may shave face and neck.  Do not bring valuables to the hospital.  [facility] is not responsible for any belongings or valuables.  Contacts, dentures or bridgework may not be worn into surgery.  Leave your suitcase in the car.  After surgery it may be brought to your room.  For patients admitted to the hospital, discharge time will be determined by your treatment team.  Patients discharged the day of surgery will not be allowed to drive home.   Special instructions:   [facility]- Preparing For Surgery  Before surgery, you can play an important role. Because skin is not sterile, your skin needs to be as free of germs as possible. You can reduce the number of germs on your skin by washing with CHG (chlorahexidine gluconate) Soap before surgery.  CHG is an antiseptic cleaner which kills germs and bonds with the skin to continue killing germs even after washing.    Oral Hygiene is also important to reduce your risk of infection.  Remember - BRUSH YOUR TEETH THE MORNING OF SURGERY WITH YOUR REGULAR TOOTHPASTE  Please do not use if you have an  allergy to CHG or antibacterial soaps. If your skin becomes reddened/irritated stop using the CHG.  Do not shave (including legs and underarms) for at least 48 hours prior to first CHG shower. It is OK to shave your face.  Please follow these instructions carefully.   1. Shower the NIGHT BEFORE SURGERY and the MORNING OF SURGERY with CHG.   2. If you chose to wash your hair, wash your hair first as usual with your normal shampoo.  3. After you shampoo, rinse your hair and body thoroughly to remove the shampoo.  4. Use CHG as you would any other liquid soap. You can apply CHG directly to the skin and wash gently with a scrungie or a clean washcloth.   5. Apply the CHG Soap to your body ONLY FROM THE NECK DOWN.  Do not use on open wounds or open sores. Avoid contact with your eyes, ears, mouth and genitals (private parts). Wash Face and genitals (private parts)  with your normal soap.  6. Wash thoroughly, paying special attention to the area where your surgery will be performed.  7. Thoroughly rinse your body with warm water from the neck down.  8. DO NOT shower/wash with your normal soap after using and rinsing off the CHG Soap.  9. Pat yourself dry with a CLEAN TOWEL.  10. Wear CLEAN PAJAMAS to bed the night before surgery, wear comfortable clothes the morning of surgery  11.  Place CLEAN SHEETS on your bed the night of your first shower and DO NOT SLEEP WITH PETS.    Day of Surgery:  Do not apply any deodorants/lotions.  Please wear clean clothes to the hospital/surgery center.   Remember to brush your teeth WITH YOUR REGULAR TOOTHPASTE.  Please read over the following fact sheets that you were given.

## 2018-07-14 ENCOUNTER — Emergency Department (HOSPITAL_COMMUNITY): Payer: Medicare Other

## 2018-07-14 ENCOUNTER — Other Ambulatory Visit: Payer: Self-pay | Admitting: *Deleted

## 2018-07-14 ENCOUNTER — Encounter (HOSPITAL_COMMUNITY): Payer: Self-pay

## 2018-07-14 ENCOUNTER — Other Ambulatory Visit (HOSPITAL_COMMUNITY): Payer: Medicare Other

## 2018-07-14 ENCOUNTER — Inpatient Hospital Stay (HOSPITAL_COMMUNITY)
Admission: EM | Admit: 2018-07-14 | Discharge: 2018-07-24 | DRG: 236 | Disposition: A | Discharge status: Home Health | Payer: Medicare Other | Source: Ambulatory Visit | Attending: Surgery | Admitting: Surgery

## 2018-07-14 ENCOUNTER — Emergency Department (HOSPITAL_COMMUNITY)
Admission: RE | Admit: 2018-07-14 | Discharge: 2018-07-14 | Disposition: A | Discharge status: Home / Self Care | Payer: Medicare Other | Source: Ambulatory Visit | Attending: Surgery | Admitting: Surgery

## 2018-07-14 ENCOUNTER — Ambulatory Visit (HOSPITAL_COMMUNITY)
Admission: RE | Admit: 2018-07-14 | Discharge: 2018-07-14 | Disposition: A | Discharge status: Home / Self Care | Payer: Medicare Other | Source: Ambulatory Visit | Attending: Surgery | Admitting: Surgery

## 2018-07-14 ENCOUNTER — Ambulatory Visit (HOSPITAL_COMMUNITY): Admission: RE | Admit: 2018-07-14 | Payer: Medicare Other | Source: Ambulatory Visit

## 2018-07-14 DIAGNOSIS — E785 Hyperlipidemia, unspecified: Secondary | ICD-10-CM | POA: Diagnosis not present

## 2018-07-14 DIAGNOSIS — Z01810 Encounter for preprocedural cardiovascular examination: Secondary | ICD-10-CM | POA: Diagnosis not present

## 2018-07-14 DIAGNOSIS — I493 Ventricular premature depolarization: Secondary | ICD-10-CM | POA: Diagnosis not present

## 2018-07-14 DIAGNOSIS — R55 Syncope and collapse: Secondary | ICD-10-CM | POA: Diagnosis not present

## 2018-07-14 DIAGNOSIS — E8770 Fluid overload, unspecified: Secondary | ICD-10-CM | POA: Diagnosis not present

## 2018-07-14 DIAGNOSIS — I2582 Chronic total occlusion of coronary artery: Secondary | ICD-10-CM | POA: Diagnosis present

## 2018-07-14 DIAGNOSIS — Z8249 Family history of ischemic heart disease and other diseases of the circulatory system: Secondary | ICD-10-CM | POA: Diagnosis not present

## 2018-07-14 DIAGNOSIS — Z951 Presence of aortocoronary bypass graft: Secondary | ICD-10-CM

## 2018-07-14 DIAGNOSIS — R001 Bradycardia, unspecified: Secondary | ICD-10-CM | POA: Diagnosis not present

## 2018-07-14 DIAGNOSIS — J181 Lobar pneumonia, unspecified organism: Secondary | ICD-10-CM | POA: Diagnosis not present

## 2018-07-14 DIAGNOSIS — K219 Gastro-esophageal reflux disease without esophagitis: Secondary | ICD-10-CM | POA: Diagnosis present

## 2018-07-14 DIAGNOSIS — Z1211 Encounter for screening for malignant neoplasm of colon: Secondary | ICD-10-CM

## 2018-07-14 DIAGNOSIS — I480 Paroxysmal atrial fibrillation: Secondary | ICD-10-CM | POA: Diagnosis not present

## 2018-07-14 DIAGNOSIS — J90 Pleural effusion, not elsewhere classified: Secondary | ICD-10-CM | POA: Diagnosis not present

## 2018-07-14 DIAGNOSIS — Z9889 Other specified postprocedural states: Secondary | ICD-10-CM

## 2018-07-14 DIAGNOSIS — Z9849 Cataract extraction status, unspecified eye: Secondary | ICD-10-CM

## 2018-07-14 DIAGNOSIS — I2510 Atherosclerotic heart disease of native coronary artery without angina pectoris: Secondary | ICD-10-CM | POA: Diagnosis not present

## 2018-07-14 DIAGNOSIS — I700 Atherosclerosis of aorta: Secondary | ICD-10-CM | POA: Diagnosis not present

## 2018-07-14 DIAGNOSIS — Z9181 History of falling: Secondary | ICD-10-CM | POA: Diagnosis not present

## 2018-07-14 DIAGNOSIS — Z800 Family history of malignant neoplasm of digestive organs: Secondary | ICD-10-CM | POA: Diagnosis not present

## 2018-07-14 DIAGNOSIS — I951 Orthostatic hypotension: Secondary | ICD-10-CM | POA: Diagnosis not present

## 2018-07-14 DIAGNOSIS — D62 Acute posthemorrhagic anemia: Secondary | ICD-10-CM | POA: Diagnosis not present

## 2018-07-14 DIAGNOSIS — J9811 Atelectasis: Secondary | ICD-10-CM | POA: Diagnosis not present

## 2018-07-14 DIAGNOSIS — Z79899 Other long term (current) drug therapy: Secondary | ICD-10-CM

## 2018-07-14 DIAGNOSIS — Z882 Allergy status to sulfonamides status: Secondary | ICD-10-CM

## 2018-07-14 DIAGNOSIS — I358 Other nonrheumatic aortic valve disorders: Secondary | ICD-10-CM | POA: Diagnosis not present

## 2018-07-14 DIAGNOSIS — I4510 Unspecified right bundle-branch block: Secondary | ICD-10-CM | POA: Diagnosis not present

## 2018-07-14 DIAGNOSIS — Z7982 Long term (current) use of aspirin: Secondary | ICD-10-CM | POA: Diagnosis not present

## 2018-07-14 DIAGNOSIS — Z4682 Encounter for fitting and adjustment of non-vascular catheter: Secondary | ICD-10-CM | POA: Diagnosis not present

## 2018-07-14 DIAGNOSIS — R42 Dizziness and giddiness: Secondary | ICD-10-CM | POA: Diagnosis not present

## 2018-07-14 LAB — COMPREHENSIVE METABOLIC PANEL
ALT: 18 U/L (ref 0–44)
AST: 22 U/L (ref 15–41)
Albumin: 2.9 g/dL — ABNORMAL LOW (ref 3.5–5.0)
Alkaline Phosphatase: 42 U/L (ref 38–126)
Anion gap: 9 (ref 5–15)
BUN: 18 mg/dL (ref 8–23)
CO2: 24 mmol/L (ref 22–32)
Calcium: 8.8 mg/dL — ABNORMAL LOW (ref 8.9–10.3)
Chloride: 106 mmol/L (ref 98–111)
Creatinine, Ser: 0.84 mg/dL (ref 0.61–1.24)
GFR calc Af Amer: >60 mL/min (ref >60)
GFR calc non Af Amer: >60 mL/min (ref >60)
Glucose, Bld: 104 mg/dL — ABNORMAL HIGH (ref 70–99)
Potassium: 4.4 mmol/L (ref 3.5–5.1)
Sodium: 139 mmol/L (ref 135–145)
Total Bilirubin: 1.2 mg/dL (ref 0.3–1.2)
Total Protein: 5.4 g/dL — ABNORMAL LOW (ref 6.5–8.1)

## 2018-07-14 LAB — PULMONARY FUNCTION TEST
FEF 25-75 Pre: 2.64 L/sec
FEF2575-%Pred-Pre: 118 %
FEV1-%Pred-Pre: 108 %
FEV1-Pre: 3.36 L
FEV1FVC-%Pred-Pre: 105 %
FEV6-%Pred-Pre: 107 %
FEV6-Pre: 4.33 L
FEV6FVC-%Pred-Pre: 104 %
FVC-%PRED-PRE: 103 %
FVC-Pre: 4.41 L
Pre FEV1/FVC ratio: 76 %
Pre FEV6/FVC Ratio: 98 %

## 2018-07-14 LAB — BASIC METABOLIC PANEL
Anion gap: 9 (ref 5–15)
BUN: 19 mg/dL (ref 8–23)
CHLORIDE: 104 mmol/L (ref 98–111)
CO2: 25 mmol/L (ref 22–32)
Calcium: 9.1 mg/dL (ref 8.9–10.3)
Creatinine, Ser: 0.98 mg/dL (ref 0.61–1.24)
GFR calc Af Amer: >60 mL/min (ref >60)
GFR calc non Af Amer: >60 mL/min (ref >60)
GLUCOSE: 84 mg/dL (ref 70–99)
Potassium: 4.6 mmol/L (ref 3.5–5.1)
Sodium: 138 mmol/L (ref 135–145)

## 2018-07-14 LAB — CBC
HCT: 40.1 % (ref 39.0–52.0)
Hemoglobin: 13.8 g/dL (ref 13.0–17.0)
MCH: 34.5 pg — AB (ref 26.0–34.0)
MCHC: 34.4 g/dL (ref 30.0–36.0)
MCV: 100.3 fL — ABNORMAL HIGH (ref 80.0–100.0)
Platelets: 205 10*3/uL (ref 150–400)
RBC: 4 MIL/uL — ABNORMAL LOW (ref 4.22–5.81)
RDW: 12.5 % (ref 11.5–15.5)
WBC: 8.4 10*3/uL (ref 4.0–10.5)
nRBC: 0 % (ref 0.0–0.2)

## 2018-07-14 LAB — HEMOGLOBIN A1C
Hgb A1c MFr Bld: 5.9 % — ABNORMAL HIGH (ref 4.8–5.6)
Mean Plasma Glucose: 122.63 mg/dL

## 2018-07-14 LAB — CBG MONITORING, ED: Glucose-Capillary: 87 mg/dL (ref 70–99)

## 2018-07-14 MED ORDER — SODIUM CHLORIDE 0.9 % IV BOLUS
1000.0000 mL | Freq: Once | INTRAVENOUS | Status: AC
  Administered 2018-07-14: 1000 mL via INTRAVENOUS

## 2018-07-14 MED ORDER — ASPIRIN EC 81 MG PO TBEC
81.0000 mg | DELAYED_RELEASE_TABLET | Freq: Every day | ORAL | Status: DC
  Administered 2018-07-15: 81 mg via ORAL
  Filled 2018-07-14: qty 1

## 2018-07-14 MED ORDER — SODIUM CHLORIDE 0.9 % IV BOLUS: 500.0000 mL | Freq: Once | INTRAVENOUS | Status: DC

## 2018-07-14 MED ORDER — ONDANSETRON HCL 4 MG/2ML IJ SOLN: 4.0000 mg | Freq: Four times a day (QID) | INTRAMUSCULAR | Status: DC | PRN

## 2018-07-14 MED ORDER — SODIUM CHLORIDE 0.9% FLUSH: 3.0000 mL | Freq: Once | INTRAVENOUS | Status: DC

## 2018-07-14 MED ORDER — PSYLLIUM 95 % PO PACK
1.0000 | PACK | Freq: Every day | ORAL | Status: DC
  Filled 2018-07-14: qty 1

## 2018-07-14 MED ORDER — ROSUVASTATIN CALCIUM 20 MG PO TABS
20.0000 mg | ORAL_TABLET | Freq: Every evening | ORAL | Status: DC
  Administered 2018-07-14 – 2018-07-23 (×9): 20 mg via ORAL
  Filled 2018-07-14 (×9): qty 1

## 2018-07-14 MED ORDER — HEPARIN BOLUS VIA INFUSION
4000.0000 [IU] | Freq: Once | INTRAVENOUS | Status: AC
  Administered 2018-07-14: 4000 [IU] via INTRAVENOUS
  Filled 2018-07-14: qty 4000

## 2018-07-14 MED ORDER — SODIUM CHLORIDE 0.9 % IV BOLUS
250.0000 mL | Freq: Once | INTRAVENOUS | Status: AC
  Administered 2018-07-14: 250 mL via INTRAVENOUS

## 2018-07-14 MED ORDER — TIMOLOL MALEATE 0.5 % OP SOLN
1.0000 [drp] | Freq: Two times a day (BID) | OPHTHALMIC | Status: DC
  Administered 2018-07-14 – 2018-07-24 (×18): 1 [drp] via OPHTHALMIC
  Filled 2018-07-14: qty 5

## 2018-07-14 MED ORDER — CARBAMIDE PEROXIDE 6.5 % OT SOLN
5.0000 [drp] | Freq: Every day | OTIC | Status: DC | PRN
  Filled 2018-07-14: qty 15

## 2018-07-14 MED ORDER — ASPIRIN 300 MG RE SUPP: 300.0000 mg | RECTAL | Status: AC

## 2018-07-14 MED ORDER — ASPIRIN 81 MG PO CHEW
324.0000 mg | CHEWABLE_TABLET | ORAL | Status: AC
  Administered 2018-07-14: 324 mg via ORAL
  Filled 2018-07-14: qty 4

## 2018-07-14 MED ORDER — NITROGLYCERIN 0.4 MG SL SUBL: 0.4000 mg | SUBLINGUAL_TABLET | SUBLINGUAL | Status: DC | PRN

## 2018-07-14 MED ORDER — ACETAMINOPHEN 325 MG PO TABS: 650.0000 mg | ORAL_TABLET | ORAL | Status: DC | PRN

## 2018-07-14 MED ORDER — HEPARIN (PORCINE) 25000 UT/250ML-% IV SOLN
850.0000 [IU]/h | INTRAVENOUS | Status: DC
  Administered 2018-07-14 – 2018-07-15 (×2): 850 [IU]/h via INTRAVENOUS
  Filled 2018-07-14 (×2): qty 250

## 2018-07-14 MED ORDER — FAMOTIDINE 20 MG PO TABS
20.0000 mg | ORAL_TABLET | Freq: Every day | ORAL | Status: DC
  Administered 2018-07-15: 20 mg via ORAL
  Filled 2018-07-14: qty 1

## 2018-07-14 MED ORDER — SODIUM CHLORIDE 0.9 % IV SOLN
500.0000 mL | INTRAVENOUS | Status: DC
  Administered 2018-07-15: 500 mL via INTRAVENOUS

## 2018-07-14 NOTE — H&P (Signed)
[address]       [place] [address]             [phone]        Brian Terry [facility] Medical Record #[id number] Date of Birth: 04-03-1941  Referring: No ref. provider found Primary Care: [name], MD Primary Cardiologist:No primary care provider on file.  Chief Complaint:    Chief Complaint  Patient presents with  . Loss of Consciousness    History of Present Illness:      [name] is a 78 year old male who presents today with several days worth of syncopal episodes which have been going on since last Tuesday. During his pulmonary function tests this morning (which are apart of his workup for CABG) he had a syncopal episode after a period of lightheadedness. His BP dropped to 80/60. He was transported to the ED for appropriate care.  He is scheduled for CABG on 07/16/2018 with Dr. Cyndia Bent for known CAD. He was seen previously in the ED on 07/09/2018 with similar complaints. He was seen by cardiology and cleared at that time. He does not have any chest pain at this time. We are holding his home dose flomax for now. BP is currently within normal limits and the patient is asymptomatic. He is awaiting a bed on 4E. We have ordered an Echocardiogram and pre CABG dopplers.    Current Activity/ Functional Status: Patient was independent with mobility/ambulation, transfers, ADL's, IADL's.   Zubrod Score: At the time of surgery this patient's most appropriate activity status/level should be described as: []     0    Normal activity, no symptoms [x]     1    Restricted in physical strenuous activity but ambulatory, able to do out light work []     2    Ambulatory and capable of self care, unable to do work activities, up and about                 more than 50%  Of the time                            []     3    Only limited self care, in bed greater than 50% of waking hours []     4    Completely disabled, no self care, confined to bed or chair []     5     Moribund  Past Medical History:  Diagnosis Date  . GERD (gastroesophageal reflux disease)   . Glaucoma   . Hemorrhoids   . Hyperlipidemia     Past Surgical History:  Procedure Laterality Date  . BALLOON DILATION N/A 03/01/2015   Procedure: BALLOON DILATION;  Surgeon: [name], MD;  Location: [facility];  Service: Endoscopy;  Laterality: N/A;  . CATARACT EXTRACTION    . ESOPHAGOGASTRODUODENOSCOPY N/A 03/01/2015   Procedure: ESOPHAGOGASTRODUODENOSCOPY (EGD);  Surgeon: [name], MD;  Location: [facility];  Service: Endoscopy;  Laterality: N/A;  . HERNIA REPAIR Left 2010   inguinal   . LEFT HEART CATH AND CORONARY ANGIOGRAPHY N/A 07/06/2018   Procedure: LEFT HEART CATH AND CORONARY ANGIOGRAPHY;  Surgeon: [name], MD;  Location: [facility];  Service: Cardiovascular;  Laterality: N/A;  . RETINAL DETACHMENT SURGERY Right 2000  . vein removal Right    R leg    Social History   Tobacco Use  Smoking Status  Never Smoker  Smokeless Tobacco Never Used    Social History   Substance and Sexual Activity  Alcohol Use No  . Alcohol/week: 0.0 standard drinks     Allergies  Allergen Reactions  . Sulfa Antibiotics Other (See Comments)    Unknown reaction/ as a child    Current Facility-Administered Medications  Medication Dose Route Frequency Provider Last Rate Last Dose  . 0.9 %  sodium chloride infusion  500 mL Intravenous Continuous Danis, Henry L III, MD      . sodium chloride 0.9 % bolus 1,000 mL  1,000 mL Intravenous Once Carlisle Cater, PA-C 1,935.5 mL/hr at 07/14/18 1446 1,000 mL at 07/14/18 1446  . sodium chloride flush (NS) 0.9 % injection 3 mL  3 mL Intravenous Once Tegeler, Gwenyth Allegra, MD       Current Outpatient Medications  Medication Sig Dispense Refill  . acetaminophen (TYLENOL) 650 MG CR tablet Take 1,300 mg by mouth daily.     . Ascorbic Acid (VITAMIN C WITH ROSE HIPS) 1000 MG tablet Take 1,000 mg by mouth daily.    Marland Kitchen aspirin EC  81 MG tablet Take 1 tablet (81 mg total) by mouth daily. (Patient taking differently: Take 81 mg by mouth at bedtime. )    . carbamide peroxide (DEBROX) 6.5 % OTIC solution Place 5 drops into the right ear daily as needed (for earwax build up).    . famotidine ([employer] ACID CONTROLLER MAX ST) 20 MG tablet Take 20 mg by mouth daily.    . Multiple Vitamin (MULTIVITAMIN WITH MINERALS) TABS tablet Take 1 tablet by mouth daily.     . nitroGLYCERIN (NITROSTAT) 0.4 MG SL tablet Place 1 tablet (0.4 mg total) under the tongue every 5 (five) minutes as needed for chest pain. 30 tablet prn  . Psyllium (METAMUCIL PO) Take 2-4 capsules by mouth daily.     . rosuvastatin (CRESTOR) 20 MG tablet Take 1 tablet (20 mg total) by mouth daily. (Patient taking differently: Take 20 mg by mouth every evening. ) 90 tablet 3  . tamsulosin (FLOMAX) 0.4 MG CAPS capsule Take 0.4 mg by mouth daily after supper. 30 minutes after supper.    . timolol (TIMOPTIC) 0.5 % ophthalmic solution Place 1 drop into both eyes 2 (two) times daily.   98    (Not in a hospital admission)   Family History  Problem Relation Age of Onset  . Heart disease Mother   . Colon polyps Father   . Stomach cancer Father   . Heart disease Brother 61       Stent     Review of Systems:   Review of Systems  Constitutional: Negative for chills and fever.  Respiratory: Negative for cough, sputum production and shortness of breath.   Cardiovascular: Negative for chest pain and leg swelling.  Gastrointestinal: Negative for abdominal pain, heartburn, nausea and vomiting.  Musculoskeletal: Positive for falls.  Neurological: Positive for dizziness (lightheadedness) and loss of consciousness.  Psychiatric/Behavioral: Negative for depression. The patient is not nervous/anxious.    Pertinent items are noted in HPI.     Physical Exam: BP 92/70 (BP Location: Right Arm)   Pulse (!) 52   Temp 97.6 F (36.4 C) (Oral)   Resp 16   Ht 5\' 10"  (1.778 m)   Wt  69.9 kg   SpO2 98%   BMI 22.10 kg/m    General appearance: alert, cooperative and no distress Resp: clear to auscultation bilaterally Cardio: regular rate and rhythm, S1,  S2 normal, no murmur, click, rub or gallop GI: soft, non-tender; bowel sounds normal; no masses,  no organomegaly Extremities: extremities normal, atraumatic, no cyanosis or edema Neurologic: Grossly normal  Diagnostic Studies & Laboratory data:   Conclusions: 1. Severe 3-vessel coronary artery disease, as detailed below, including 60% distal LMCA, 70-80% proximal LAD, and 80-90% ostial LCx stenoses, as well as chronic total occlusion of mid RCA with left-to-right collaterals. 2. Normal left ventricular contraction and filling pressure.  Recommendations: 1. Outpatient cardiac surgery consultation for CABG. 2. Aggressive secondary prevention. 3. Aspirin 81 mg daily and sublingual NTG as needed for chest pain.  [name], MD [facility] Pager: [phone]   CLINICAL DATA:  Near syncopal episode while performing pulmonary function tests earlier today. Preoperative evaluation prior to CABG.  EXAM: CHEST - 2 VIEW  COMPARISON:  07/09/2018, 12/19/2009.  FINDINGS: Cardiac silhouette normal in size, unchanged. Thoracic aorta mildly atherosclerotic, unchanged. Hilar and mediastinal contours otherwise unremarkable. Lungs clear. Bronchovascular markings normal. Pulmonary vascularity normal. No visible pleural effusions. No pneumothorax. Degenerative changes involving the RIGHT shoulder joint and degenerative changes involving the thoracic spine.  IMPRESSION: 1. No acute cardiopulmonary disease. 2.  Aortic Atherosclerosis (ICD10-170.0)   Electronically Signed   By: Evangeline Dakin M.D.   On: 07/14/2018 12:50      Recent Radiology Findings:   Dg Chest 2 View  Result Date: 07/14/2018 CLINICAL DATA:  Near syncopal episode while performing pulmonary function tests earlier today.  Preoperative evaluation prior to CABG. EXAM: CHEST - 2 VIEW COMPARISON:  07/09/2018, 12/19/2009. FINDINGS: Cardiac silhouette normal in size, unchanged. Thoracic aorta mildly atherosclerotic, unchanged. Hilar and mediastinal contours otherwise unremarkable. Lungs clear. Bronchovascular markings normal. Pulmonary vascularity normal. No visible pleural effusions. No pneumothorax. Degenerative changes involving the RIGHT shoulder joint and degenerative changes involving the thoracic spine. IMPRESSION: 1. No acute cardiopulmonary disease. 2.  Aortic Atherosclerosis (ICD10-170.0) Electronically Signed   By: Evangeline Dakin M.D.   On: 07/14/2018 12:50     I have independently reviewed the above radiologic studies and discussed with the patient   Recent Lab Findings: Lab Results  Component Value Date   WBC 8.4 07/14/2018   HGB 13.8 07/14/2018   HCT 40.1 07/14/2018   PLT 205 07/14/2018   GLUCOSE 84 07/14/2018   CHOL 260 (HH) 10/24/2006   TRIG 78 10/24/2006   HDL 72.7 10/24/2006   LDLDIRECT 164.4 10/24/2006   ALT 17 07/09/2018   AST 22 07/09/2018   NA 138 07/14/2018   K 4.6 07/14/2018   CL 104 07/14/2018   CREATININE 0.98 07/14/2018   BUN 19 07/14/2018   CO2 25 07/14/2018   TSH 2.410 06/26/2018      Assessment / Plan:      1. Syncopal Episodes-Obtaining Echocardiogram. Receiving IV fluids. BP has improved. HR remains bradycardic. Stopped Flomax. Up with assistance.  2. Known CAD-continue ASA, statin. PRN nitro tabs ordered but try to avoid due to hypotension. No current chest pain. Heparin gtt initiated.  3. Hyperlipidemia-continue statin therapy.  Plan: Admit to inpatient- 4E is preferred. Will order Echocardiogram and CABG preop dopplers. Dr. Cyndia Bent to evaluate. Still plan for CABG Thursday 2/13.    I  spent 30 minutes counseling the patient face to face.   Nicholes Rough, PA-C 07/14/2018 2:58 PM

## 2018-07-14 NOTE — ED Notes (Signed)
Report attempted 

## 2018-07-14 NOTE — Progress Notes (Signed)
ANTICOAGULATION CONSULT NOTE - Follow Up Consult  Pharmacy Consult for heparin Indication: chest pain/ACS  Allergies  Allergen Reactions  . Sulfa Antibiotics Other (See Comments)    Unknown reaction/ as a child    Patient Measurements: Height: 5\' 10"  (177.8 cm) Weight: 154 lb (69.9 kg) IBW/kg (Calculated) : 73 Heparin Dosing Weight: 70 kg  Vital Signs: Temp: 97.6 F (36.4 C) (02/11 1138) Temp Source: Oral (02/11 1138) BP: 92/70 (02/11 1408) Pulse Rate: 52 (02/11 1408)  Labs: Recent Labs    07/14/18 1135  HGB 13.8  HCT 40.1  PLT 205  CREATININE 0.98    Estimated Creatinine Clearance: 63.4 mL/min (by C-G formula based on SCr of 0.98 mg/dL).  Assessment: 78 yo M presents after syncopal episode. Was scheduled for CABG on 2/12 with Dr. Cyndia Bent. CBC stable.   Goal of Therapy:  Heparin level 0.3-0.7 units/ml Monitor platelets by anticoagulation protocol: Yes   Plan:  Give heparin 4,000 units bolus  Start heparin gtt at 850 units/hr Check heparin level in 8 hrs Monitor daily heparin level, CBC, s/s of bleed   Reginia Naas 07/14/2018,3:27 PM

## 2018-07-14 NOTE — Progress Notes (Signed)
Called to RT PFT room with patient with syncopal episode x2.  RT reports he had one episode of syncope that cleared quickly after blowing in PFT lab.  Second epiosode he became pale and diaphoretic with  brief period of unresponsive.  He was lowered to the floor by RT and RN staff. He began to recover = color returned and he denied CP or SOB.  Patient and wife confirm he has felt quite dizzy this week at home. Pulse present - BP 80/60 manual.  Stretcher obtained - patient stood to stretcher - now warm and dry - oriented - no pain or SOB. Transported to ED without incident.  Handoff at the bridge to ED staff. Wife present.

## 2018-07-14 NOTE — ED Notes (Signed)
Got patient undress on the monitor did cbg it was 52 notified RN of blood sugar patient is resting with call bell in reach and family at bedside

## 2018-07-14 NOTE — ED Triage Notes (Signed)
Pt presents for evaluation of syncope today while doing a pulmonary function study. Pt is awake, alert and oriented now. No fall, staff lowered to ground.

## 2018-07-14 NOTE — ED Provider Notes (Signed)
[facility]PART[facility] Provider Note   CSN: [id number] Arrival date & time: 07/14/18  1126     History   Chief Complaint Chief Complaint  Patient presents with  . Loss of Consciousness    HPI Brian Terry is a 78 y.o. male.  Patient with known ACS, currently undergoing preop evaluation for CABG which is scheduled for 07/16/2018 --presents to the emergency department with syncopal episode.  Patient was having pulmonary function testing performed this morning.  He was doing deep breathing exercises and blowing into a breathing tube.  He had a syncopal episode at this time after a period of lightheadedness.  Patient was then transported to the emergency department for evaluation.  No associated chest pains or shortness of breath.  He is currently back to his baseline.  Patient was seen in the emergency department for syncope on 07/09/18. He was seen by cardiology, cleared, and allowed to go to cardiothoracic surgery for consultation on 07/09/18.  Patient has continued to have near syncopal spells over the course of the past week.  These occur most often when he is going from a lying or sitting to standing position.  Patient does not have any associated chest pain or shortness of breath.  In addition, he did develop chest congestion with a nonproductive cough.  He denies any current headaches or URI symptoms.  No vomiting or diarrhea.  No blood noted in the stool or urine.       Past Medical History:  Diagnosis Date  . GERD (gastroesophageal reflux disease)   . Glaucoma   . Hemorrhoids   . Hyperlipidemia     Patient Active Problem List   Diagnosis Date Noted  . CAD in native artery 07/06/2018  . Abnormal stress test 06/29/2018  . Medication management 06/29/2018  . Hyperlipidemia 06/29/2018  . Esophageal stricture 03/01/2015  . Duodenitis 03/01/2015  . Dysphagia, pharyngoesophageal phase 02/24/2015  . GILBERT'S SYNDROME 10/22/2006  . ANHIDROSIS  10/22/2006    Past Surgical History:  Procedure Laterality Date  . BALLOON DILATION N/A 03/01/2015   Procedure: BALLOON DILATION;  Surgeon: [name], MD;  Location: [facility];  Service: Endoscopy;  Laterality: N/A;  . CATARACT EXTRACTION    . ESOPHAGOGASTRODUODENOSCOPY N/A 03/01/2015   Procedure: ESOPHAGOGASTRODUODENOSCOPY (EGD);  Surgeon: [name], MD;  Location: [facility];  Service: Endoscopy;  Laterality: N/A;  . HERNIA REPAIR Left 2010   inguinal   . LEFT HEART CATH AND CORONARY ANGIOGRAPHY N/A 07/06/2018   Procedure: LEFT HEART CATH AND CORONARY ANGIOGRAPHY;  Surgeon: [name], MD;  Location: [facility];  Service: Cardiovascular;  Laterality: N/A;  . RETINAL DETACHMENT SURGERY Right 2000  . vein removal Right    R leg        Home Medications    Prior to Admission medications   Medication Sig Start Date End Date Taking? Authorizing Provider  acetaminophen (TYLENOL) 650 MG CR tablet Take 1,300 mg by mouth daily.     [provider]  aspirin EC 81 MG tablet Take 1 tablet (81 mg total) by mouth daily. 07/06/18   End, Harrell Gave, MD  carbamide peroxide (DEBROX) 6.5 % OTIC solution Place 5 drops into the right ear daily as needed (for earwax build up).    [provider]  famotidine ([employer] ACID CONTROLLER MAX ST) 20 MG tablet Take 20 mg by mouth daily.    [provider]  Multiple Vitamin (MULTIVITAMIN WITH MINERALS) TABS tablet Take 1 tablet  by mouth daily. Solgar Multiple vitamins with chelated minerals    [provider]  nitroGLYCERIN (NITROSTAT) 0.4 MG SL tablet Place 1 tablet (0.4 mg total) under the tongue every 5 (five) minutes as needed for chest pain. 07/06/18 07/06/19  End, Harrell Gave, MD  Psyllium (METAMUCIL PO) Take 2-4 capsules by mouth daily.     [provider]  rosuvastatin (CRESTOR) 20 MG tablet Take 1 tablet (20 mg total) by mouth daily. Patient taking differently: Take 20 mg by mouth every  evening.  06/29/18 09/27/18  Minus Breeding, MD  tamsulosin (FLOMAX) 0.4 MG CAPS capsule Take 0.4 mg by mouth daily after supper. 30 minutes after supper.    [provider]  timolol (TIMOPTIC) 0.5 % ophthalmic solution Place 1 drop into both eyes 2 (two) times daily.  01/27/15   [provider]    Family History Family History  Problem Relation Age of Onset  . Heart disease Mother   . Colon polyps Father   . Stomach cancer Father   . Heart disease Brother 59       Stent    Social History Social History   Tobacco Use  . Smoking status: Never Smoker  . Smokeless tobacco: Never Used  Substance Use Topics  . Alcohol use: No    Alcohol/week: 0.0 standard drinks  . Drug use: No     Allergies   Sulfa antibiotics   Review of Systems Review of Systems  Constitutional: Negative for diaphoresis and fever.  Eyes: Negative for redness.  Respiratory: Negative for cough and shortness of breath.   Cardiovascular: Negative for chest pain, palpitations and leg swelling.  Gastrointestinal: Negative for abdominal pain, nausea and vomiting.  Genitourinary: Negative for dysuria.  Musculoskeletal: Negative for back pain and neck pain.  Skin: Negative for rash.  Neurological: Positive for dizziness and syncope. Negative for tremors, facial asymmetry, speech difficulty, light-headedness and headaches.  Psychiatric/Behavioral: The patient is not nervous/anxious.      Physical Exam Updated Vital Signs BP 104/80 (BP Location: Right Arm)   Pulse (!) 50   Temp 97.6 F (36.4 C) (Oral)   Resp 17   Ht 5\' 10"  (1.778 m)   Wt 69.9 kg   SpO2 100%   BMI 22.10 kg/m   Physical Exam Vitals signs and nursing note reviewed.  Constitutional:      Appearance: He is well-developed. He is not diaphoretic.  HENT:     Head: Normocephalic and atraumatic.     Right Ear: Tympanic membrane, ear canal and external ear normal.     Left Ear: Tympanic membrane, ear canal and external ear  normal.     Nose: Nose normal.     Mouth/Throat:     Mouth: Mucous membranes are not dry.     Pharynx: Uvula midline.  Eyes:     General: Lids are normal.     Conjunctiva/sclera: Conjunctivae normal.     Pupils: Pupils are equal, round, and reactive to light.  Neck:     Musculoskeletal: Normal range of motion and neck supple. No muscular tenderness.     Vascular: Normal carotid pulses. No carotid bruit or JVD.     Trachea: Trachea normal. No tracheal deviation.  Cardiovascular:     Rate and Rhythm: Regular rhythm. Bradycardia present.     Pulses: No decreased pulses.     Heart sounds: Normal heart sounds, S1 normal and S2 normal. Heart sounds not distant. No murmur.  Pulmonary:     Effort: Pulmonary  effort is normal. No respiratory distress.     Breath sounds: Normal breath sounds. No wheezing.  Chest:     Chest wall: No tenderness.  Abdominal:     General: Bowel sounds are normal.     Palpations: Abdomen is soft.     Tenderness: There is no abdominal tenderness. There is no guarding or rebound.  Musculoskeletal: Normal range of motion.     Cervical back: He exhibits normal range of motion, no tenderness and no bony tenderness.  Skin:    General: Skin is warm and dry.     Coloration: Skin is not pale.  Neurological:     Mental Status: He is alert and oriented to person, place, and time.     GCS: GCS eye subscore is 4. GCS verbal subscore is 5. GCS motor subscore is 6.     Cranial Nerves: No cranial nerve deficit.     Sensory: No sensory deficit.     Motor: No abnormal muscle tone.     Coordination: Coordination normal.     Deep Tendon Reflexes: Reflexes are normal and symmetric.      ED Treatments / Results  Labs (all labs ordered are listed, but only abnormal results are displayed) Labs Reviewed  CBC - Abnormal; Notable for the following components:      Result Value   RBC 4.00 (*)    MCV 100.3 (*)    MCH 34.5 (*)    All other components within normal limits    BASIC METABOLIC PANEL  URINALYSIS, ROUTINE W REFLEX MICROSCOPIC  CBG MONITORING, ED    EKG EKG Interpretation  Date/Time:  Tuesday July 14 2018 11:33:10 EST Ventricular Rate:  63 PR Interval:  174 QRS Duration: 104 QT Interval:  382 QTC Calculation: 390 R Axis:   24 Text Interpretation:  Normal sinus rhythm Incomplete right bundle branch block Borderline ECG When compared to prior, no significant changes seen.  No STEMI Confirmed by Antony Blackbird [phone]) on 07/14/2018 11:49:26 AM   Radiology Dg Chest 2 View  Result Date: 07/14/2018 CLINICAL DATA:  Near syncopal episode while performing pulmonary function tests earlier today. Preoperative evaluation prior to CABG. EXAM: CHEST - 2 VIEW COMPARISON:  07/09/2018, 12/19/2009. FINDINGS: Cardiac silhouette normal in size, unchanged. Thoracic aorta mildly atherosclerotic, unchanged. Hilar and mediastinal contours otherwise unremarkable. Lungs clear. Bronchovascular markings normal. Pulmonary vascularity normal. No visible pleural effusions. No pneumothorax. Degenerative changes involving the RIGHT shoulder joint and degenerative changes involving the thoracic spine. IMPRESSION: 1. No acute cardiopulmonary disease. 2.  Aortic Atherosclerosis (ICD10-170.0) Electronically Signed   By: Evangeline Dakin M.D.   On: 07/14/2018 12:50    Procedures Procedures (including critical care time)  Medications Ordered in ED Medications  sodium chloride flush (NS) 0.9 % injection 3 mL (3 mLs Intravenous Not Given 07/14/18 1205)  sodium chloride 0.9 % bolus 1,000 mL (1,000 mLs Intravenous New Bag/Given 07/14/18 1446)  sodium chloride 0.9 % bolus 250 mL (0 mLs Intravenous Stopped 07/14/18 1245)     Initial Impression / Assessment and Plan / ED Course  I have reviewed the triage vital signs and the nursing notes.  Pertinent labs & imaging results that were available during my care of the patient were reviewed by me and considered in my medical decision  making (see chart for details).     Patient seen and examined. Work-up initiated. Medications ordered.   Vital signs reviewed and are as follows: BP (!) 83/58   Pulse 62   Temp  97.6 F (36.4 C) (Oral)   Resp 16   Ht 5\' 10"  (1.778 m)   Wt 69.9 kg   SpO2 96%   BMI 22.10 kg/m   Orthostatic VS for the past 24 hrs:  BP- Lying Pulse- Lying BP- Sitting Pulse- Sitting BP- Standing at 0 minutes Pulse- Standing at 0 minutes  07/14/18 1209 (!) 87/66 55 102/77 57 (!) 79/57 73   12:29 PM Patient with significant orthostasis. 250cc NS bolus ordered. Will need admission given his symptoms. Also patient and wife are very concerned regarding frequent near syncopal spells.   2:15 PM Spoke with Dr. Algis Liming. Reccs fluids and reambulate, discuss with [facility].   2:49 PM Spoke with RN for Dr. Cyndia Bent. They agree with admission. CT surgery will admit to their service.   BP 92/70 (BP Location: Right Arm)   Pulse (!) 52   Temp 97.6 F (36.4 C) (Oral)   Resp 16   Ht 5\' 10"  (1.778 m)   Wt 69.9 kg   SpO2 98%   BMI 22.10 kg/m    Final Clinical Impressions(s) / ED Diagnoses   Final diagnoses:  Vasovagal syncope   Admit for hypotension, syncope, plan for CABG in 2 days.   ED Discharge Orders    None       Carlisle Cater, [place] 07/14/18 1459    Tegeler, Gwenyth Allegra, MD 07/14/18 1827

## 2018-07-15 ENCOUNTER — Inpatient Hospital Stay (HOSPITAL_COMMUNITY): Payer: Medicare Other

## 2018-07-15 DIAGNOSIS — Z01810 Encounter for preprocedural cardiovascular examination: Secondary | ICD-10-CM

## 2018-07-15 LAB — CBC
HCT: 39.2 % (ref 39.0–52.0)
Hemoglobin: 13.2 g/dL (ref 13.0–17.0)
MCH: 33.4 pg (ref 26.0–34.0)
MCHC: 33.7 g/dL (ref 30.0–36.0)
MCV: 99.2 fL (ref 80.0–100.0)
NRBC: 0 % (ref 0.0–0.2)
Platelets: 201 10*3/uL (ref 150–400)
RBC: 3.95 MIL/uL — ABNORMAL LOW (ref 4.22–5.81)
RDW: 12.7 % (ref 11.5–15.5)
WBC: 8.6 10*3/uL (ref 4.0–10.5)

## 2018-07-15 LAB — BASIC METABOLIC PANEL
ANION GAP: 12 (ref 5–15)
BUN: 14 mg/dL (ref 8–23)
CO2: 25 mmol/L (ref 22–32)
Calcium: 9 mg/dL (ref 8.9–10.3)
Chloride: 104 mmol/L (ref 98–111)
Creatinine, Ser: 0.95 mg/dL (ref 0.61–1.24)
GFR calc Af Amer: >60 mL/min (ref >60)
GFR calc non Af Amer: >60 mL/min (ref >60)
Glucose, Bld: 97 mg/dL (ref 70–99)
Potassium: 4.1 mmol/L (ref 3.5–5.1)
Sodium: 141 mmol/L (ref 135–145)

## 2018-07-15 LAB — ABO/RH: ABO/RH(D): A POS

## 2018-07-15 LAB — HEPARIN LEVEL (UNFRACTIONATED): Heparin Unfractionated: 0.35 IU/mL (ref 0.30–0.70)

## 2018-07-15 MED ORDER — DEXMEDETOMIDINE HCL IN NACL 400 MCG/100ML IV SOLN
0.1000 ug/kg/h | INTRAVENOUS | Status: AC
  Administered 2018-07-16: .4 ug/kg/h via INTRAVENOUS
  Filled 2018-07-15: qty 100

## 2018-07-15 MED ORDER — EPINEPHRINE PF 1 MG/ML IJ SOLN
0.0000 ug/min | INTRAVENOUS | Status: DC
  Filled 2018-07-15: qty 4

## 2018-07-15 MED ORDER — MILRINONE LACTATE IN DEXTROSE 20-5 MG/100ML-% IV SOLN
0.3000 ug/kg/min | INTRAVENOUS | Status: DC
  Filled 2018-07-15: qty 100

## 2018-07-15 MED ORDER — TRANEXAMIC ACID (OHS) PUMP PRIME SOLUTION
2.0000 mg/kg | INTRAVENOUS | Status: DC
  Filled 2018-07-15: qty 1.36

## 2018-07-15 MED ORDER — METOPROLOL TARTRATE 12.5 MG HALF TABLET: 12.5000 mg | ORAL_TABLET | Freq: Once | ORAL | Status: DC

## 2018-07-15 MED ORDER — DIAZEPAM 2 MG PO TABS
2.0000 mg | ORAL_TABLET | Freq: Once | ORAL | Status: AC
  Administered 2018-07-16: 2 mg via ORAL
  Filled 2018-07-15: qty 1

## 2018-07-15 MED ORDER — CHLORHEXIDINE GLUCONATE 0.12 % MT SOLN
15.0000 mL | Freq: Once | OROMUCOSAL | Status: AC
  Administered 2018-07-16: 15 mL via OROMUCOSAL
  Filled 2018-07-15: qty 15

## 2018-07-15 MED ORDER — CHLORHEXIDINE GLUCONATE CLOTH 2 % EX PADS
6.0000 | MEDICATED_PAD | Freq: Once | CUTANEOUS | Status: AC
  Administered 2018-07-15: 6 via TOPICAL

## 2018-07-15 MED ORDER — TRANEXAMIC ACID (OHS) BOLUS VIA INFUSION
15.0000 mg/kg | INTRAVENOUS | Status: DC
  Filled 2018-07-15: qty 1020

## 2018-07-15 MED ORDER — BISACODYL 5 MG PO TBEC
5.0000 mg | DELAYED_RELEASE_TABLET | Freq: Once | ORAL | Status: AC
  Administered 2018-07-15: 5 mg via ORAL
  Filled 2018-07-15: qty 1

## 2018-07-15 MED ORDER — SODIUM CHLORIDE 0.9 % IV SOLN
1.5000 g | INTRAVENOUS | Status: DC
  Filled 2018-07-15: qty 1.5

## 2018-07-15 MED ORDER — DEXMEDETOMIDINE HCL IN NACL 400 MCG/100ML IV SOLN
0.1000 ug/kg/h | INTRAVENOUS | Status: DC
  Filled 2018-07-15: qty 100

## 2018-07-15 MED ORDER — SODIUM CHLORIDE 0.9 % IV SOLN
INTRAVENOUS | Status: DC
  Filled 2018-07-15: qty 30

## 2018-07-15 MED ORDER — INSULIN REGULAR(HUMAN) IN NACL 100-0.9 UT/100ML-% IV SOLN
INTRAVENOUS | Status: DC
  Filled 2018-07-15: qty 100

## 2018-07-15 MED ORDER — VANCOMYCIN HCL 10 G IV SOLR
1250.0000 mg | INTRAVENOUS | Status: AC
  Administered 2018-07-16: 1250 mg via INTRAVENOUS
  Filled 2018-07-15: qty 1250

## 2018-07-15 MED ORDER — NITROGLYCERIN IN D5W 200-5 MCG/ML-% IV SOLN
2.0000 ug/min | INTRAVENOUS | Status: AC
  Administered 2018-07-16: 16.6 ug/min via INTRAVENOUS
  Filled 2018-07-15: qty 250

## 2018-07-15 MED ORDER — PHENYLEPHRINE HCL-NACL 20-0.9 MG/250ML-% IV SOLN
30.0000 ug/min | INTRAVENOUS | Status: AC
  Administered 2018-07-16: 40 ug/min via INTRAVENOUS
  Filled 2018-07-15: qty 250

## 2018-07-15 MED ORDER — MAGNESIUM SULFATE 50 % IJ SOLN
40.0000 meq | INTRAMUSCULAR | Status: DC
  Filled 2018-07-15: qty 9.85

## 2018-07-15 MED ORDER — INSULIN REGULAR(HUMAN) IN NACL 100-0.9 UT/100ML-% IV SOLN
INTRAVENOUS | Status: AC
  Administered 2018-07-16: 1 [IU]/h via INTRAVENOUS
  Filled 2018-07-15: qty 100

## 2018-07-15 MED ORDER — TRANEXAMIC ACID 1000 MG/10ML IV SOLN
1.5000 mg/kg/h | INTRAVENOUS | Status: AC
  Administered 2018-07-16: 1.5 mg/kg/h via INTRAVENOUS
  Filled 2018-07-15: qty 25

## 2018-07-15 MED ORDER — NOREPINEPHRINE 4 MG/250ML-% IV SOLN
0.0000 ug/min | INTRAVENOUS | Status: DC
  Filled 2018-07-15: qty 250

## 2018-07-15 MED ORDER — TEMAZEPAM 7.5 MG PO CAPS: 15.0000 mg | ORAL_CAPSULE | Freq: Once | ORAL | Status: DC | PRN

## 2018-07-15 MED ORDER — SODIUM CHLORIDE 0.9 % IV SOLN
750.0000 mg | INTRAVENOUS | Status: DC
  Filled 2018-07-15: qty 750

## 2018-07-15 MED ORDER — NITROGLYCERIN IN D5W 200-5 MCG/ML-% IV SOLN
2.0000 ug/min | INTRAVENOUS | Status: DC
  Filled 2018-07-15: qty 250

## 2018-07-15 MED ORDER — PLASMA-LYTE 148 IV SOLN
INTRAVENOUS | Status: AC
  Administered 2018-07-16: 500 mL
  Filled 2018-07-15: qty 2.5

## 2018-07-15 MED ORDER — VANCOMYCIN HCL 10 G IV SOLR
1250.0000 mg | INTRAVENOUS | Status: DC
  Filled 2018-07-15: qty 1250

## 2018-07-15 MED ORDER — PHENYLEPHRINE HCL-NACL 20-0.9 MG/250ML-% IV SOLN
30.0000 ug/min | INTRAVENOUS | Status: DC
  Filled 2018-07-15: qty 250

## 2018-07-15 MED ORDER — POTASSIUM CHLORIDE 2 MEQ/ML IV SOLN
80.0000 meq | INTRAVENOUS | Status: DC
  Filled 2018-07-15: qty 40

## 2018-07-15 MED ORDER — PLASMA-LYTE 148 IV SOLN
INTRAVENOUS | Status: DC
  Filled 2018-07-15: qty 2.5

## 2018-07-15 MED ORDER — CHLORHEXIDINE GLUCONATE CLOTH 2 % EX PADS
6.0000 | MEDICATED_PAD | Freq: Once | CUTANEOUS | Status: AC
  Administered 2018-07-16: 6 via TOPICAL

## 2018-07-15 MED ORDER — TRANEXAMIC ACID 1000 MG/10ML IV SOLN
1.5000 mg/kg/h | INTRAVENOUS | Status: DC
  Filled 2018-07-15: qty 25

## 2018-07-15 MED ORDER — DOPAMINE-DEXTROSE 3.2-5 MG/ML-% IV SOLN
0.0000 ug/kg/min | INTRAVENOUS | Status: DC
  Filled 2018-07-15: qty 250

## 2018-07-15 MED ORDER — SODIUM CHLORIDE 0.9 % IV SOLN
1.5000 g | INTRAVENOUS | Status: AC
  Administered 2018-07-16: 1.5 g via INTRAVENOUS
  Filled 2018-07-15: qty 1.5

## 2018-07-15 MED ORDER — TRANEXAMIC ACID (OHS) BOLUS VIA INFUSION
15.0000 mg/kg | INTRAVENOUS | Status: AC
  Administered 2018-07-16: 1020 mg via INTRAVENOUS
  Filled 2018-07-15: qty 1020

## 2018-07-15 NOTE — Anesthesia Preprocedure Evaluation (Addendum)
Anesthesia Evaluation  Patient identified by MRN, date of birth, ID band Patient awake    Reviewed: Allergy & Precautions, NPO status , Patient's Chart, lab work & pertinent test results  History of Anesthesia Complications Negative for: history of anesthetic complications  Airway Mallampati: II  TM Distance: >3 FB Neck ROM: Full    Dental no notable dental hx. (+) Dental Advisory Given   Pulmonary neg pulmonary ROS,    Pulmonary exam normal breath sounds clear to auscultation       Cardiovascular + CAD  Normal cardiovascular exam Rhythm:Regular Rate:Normal  Conclusions: 1. Severe 3-vessel coronary artery disease, as detailed below, including 60% distal LMCA, 70-80% proximal LAD, and 80-90% ostial LCx stenoses, as well as chronic total occlusion of mid RCA with left-to-right collaterals. 2. Normal left ventricular contraction and filling pressure.    Neuro/Psych negative neurological ROS     GI/Hepatic Neg liver ROS, GERD  Medicated and Controlled,  Endo/Other  negative endocrine ROS  Renal/GU negative Renal ROS     Musculoskeletal negative musculoskeletal ROS (+)   Abdominal   Peds  Hematology  (+) anemia , HLD   Anesthesia Other Findings   Reproductive/Obstetrics                           Anesthesia Physical Anesthesia Plan  ASA: IV  Anesthesia Plan: General   Post-op Pain Management:    Induction: Intravenous  PONV Risk Score and Plan: 3 and Ondansetron, Dexamethasone and Diphenhydramine  Airway Management Planned: Oral ETT  Additional Equipment: Arterial line, PA Cath, 3D TEE and Ultrasound Guidance Line Placement  Intra-op Plan:   Post-operative Plan: Post-operative intubation/ventilation  Informed Consent: I have reviewed the patients History and Physical, chart, labs and discussed the procedure including the risks, benefits and alternatives for the proposed  anesthesia with the patient or authorized representative who has indicated his/her understanding and acceptance.     Dental advisory given  Plan Discussed with: CRNA and Anesthesiologist  Anesthesia Plan Comments:       Anesthesia Quick Evaluation

## 2018-07-15 NOTE — Progress Notes (Signed)
PRE CABG has been completed.   Preliminary results in CV Proc.   Abram Sander 07/15/2018 2:24 PM

## 2018-07-15 NOTE — Progress Notes (Signed)
1025-1100 Discussed with pt and wife the importance of IS and walking after surgery. Gave in the tube handout and discussed sternal precautions. Did not give IS since pt's wife stated he had issues with PFTs and could not complete. Pt has OHS booklet. Gave care guide and wrote down how to view pre op video. Wife will be available after discharge to assist with care.  Assisted pt to bathroom. Will follow up after surgery. Graylon Good RN BSN 07/15/2018 11:00 AM

## 2018-07-15 NOTE — Progress Notes (Signed)
CCMD calls to notify Pt's HR is 40-45, sinus bradycardia on mornitor,asymptomatic, sleeping comfortably, BP stable. Will continue to monitor.  Kennyth Lose, BSN,RN,PCCN-CMC

## 2018-07-15 NOTE — Progress Notes (Addendum)
      [address]       [place] [address]             [phone]       Subjective:  Doing okay.  Some mild chest discomfort at times.  Was able to move his bowels this morning.  Objective: Vital signs in last 24 hours: Temp:  [97.6 F (36.4 C)-98.3 F (36.8 C)] 97.7 F (36.5 C) (02/12 0454) Pulse Rate:  [50-62] 53 (02/12 0454) Cardiac Rhythm: Sinus bradycardia (02/12 0529) Resp:  [13-18] 15 (02/11 1959) BP: (83-124)/(58-80) 109/80 (02/12 0454) SpO2:  [95 %-100 %] 96 % (02/12 0454) Weight:  [68 kg-69.9 kg] 68 kg (02/12 0454)  Intake/Output from previous day: 02/11 0701 - 02/12 0700 In: 674.9 [P.O.:490; I.V.:184.9] Out: 3 [Urine:2; Stool:1]  General appearance: alert, cooperative and no distress Heart: regular rate and rhythm Lungs: clear to auscultation bilaterally Abdomen: soft, non-tender; bowel sounds normal; no masses,  no organomegaly Extremities: extremities normal, atraumatic, no cyanosis or edema   Lab Results: Recent Labs    07/14/18 1135 07/15/18 0247  WBC 8.4 8.6  HGB 13.8 13.2  HCT 40.1 39.2  PLT 205 201   BMET:  Recent Labs    07/14/18 1829 07/15/18 0247  NA 139 141  K 4.4 4.1  CL 106 104  CO2 24 25  GLUCOSE 104* 97  BUN 18 14  CREATININE 0.84 0.95  CALCIUM 8.8* 9.0    PT/INR: No results for input(s): LABPROT, INR in the last 72 hours. ABG    Component Value Date/Time   TCO2 25 02/15/2018 1222   CBG (last 3)  Recent Labs    07/14/18 1146  GLUCAP 87    Assessment/Plan:  1. CV- going Bradycardic overnight in the 40s, + hypotension- no BB at this time, could be the cause of his syncopal episode yesterday morning 2. Pulm- no acute issues 3.Hyperlipidemia- on Crestor 4. Dispo- patient stable, for CABG in AM, no BB with Bradycardia and Hypotension   LOS: 1 day    Ellwood Handler 07/15/2018   Chart reviewed, patient examined, agree with above. He feels well this morning but had some bradycardia into the 40s  overnight.  He has had no syncope since admission.  He said he has some very mild chest discomfort but not at this time.  His blood pressure this morning is 109/80 and his heart rate is sinus in the 60s.  His EKG this morning shows sinus bradycardia at 58 but otherwise normal EKG.  We will plan to proceed with coronary bypass surgery tomorrow.  We will continue to monitor his rhythm closely on telemetry.

## 2018-07-15 NOTE — Progress Notes (Signed)
ANTICOAGULATION CONSULT NOTE - Follow Up Consult  Pharmacy Consult for heparin Indication: chest pain/ACS  Allergies  Allergen Reactions  . Sulfa Antibiotics Other (See Comments)    Unknown reaction/ as a child    Patient Measurements: Height: 5\' 10"  (177.8 cm) Weight: 150 lb (68 kg) IBW/kg (Calculated) : 73 Heparin Dosing Weight: 70 kg  Vital Signs: Temp: 97.7 F (36.5 C) (02/12 0454) Temp Source: Oral (02/12 0454) BP: 109/80 (02/12 0454) Pulse Rate: 53 (02/12 0454)  Labs: Recent Labs    07/14/18 1135 07/14/18 1829 07/15/18 0247  HGB 13.8  --  13.2  HCT 40.1  --  39.2  PLT 205  --  201  HEPARINUNFRC  --   --  0.35  CREATININE 0.98 0.84 0.95    Estimated Creatinine Clearance: 63.6 mL/min (by C-G formula based on SCr of 0.95 mg/dL).  Assessment: 78 yo M presents after syncopal episode. Was scheduled for CABG on 2/12 with Dr. Cyndia Bent, this has been pushed to 2/13.   Heparin level at goal, cbc wnl. No bleeding noted.  Goal of Therapy:  Heparin level 0.3-0.7 units/ml Monitor platelets by anticoagulation protocol: Yes   Plan:  Heparin gtt at 850 units/hr Monitor daily heparin level, CBC, s/s of bleed  Erin Hearing PharmD., BCPS Clinical Pharmacist 07/15/2018 9:02 AM

## 2018-07-16 ENCOUNTER — Inpatient Hospital Stay (HOSPITAL_COMMUNITY): Admission: RE | Admit: 2018-07-16 | Payer: Medicare Other | Source: Home / Self Care | Admitting: Surgery

## 2018-07-16 ENCOUNTER — Inpatient Hospital Stay (HOSPITAL_COMMUNITY): Payer: Medicare Other | Admitting: Certified Registered"

## 2018-07-16 ENCOUNTER — Inpatient Hospital Stay (HOSPITAL_COMMUNITY): Payer: Medicare Other

## 2018-07-16 ENCOUNTER — Encounter (HOSPITAL_COMMUNITY): Payer: Self-pay | Admitting: Certified Registered"

## 2018-07-16 ENCOUNTER — Inpatient Hospital Stay (HOSPITAL_COMMUNITY)
Admission: EM | Disposition: A | Discharge status: Home Health | Payer: Self-pay | Source: Home / Self Care | Attending: Surgery

## 2018-07-16 DIAGNOSIS — Z951 Presence of aortocoronary bypass graft: Secondary | ICD-10-CM

## 2018-07-16 HISTORY — PX: TEE WITHOUT CARDIOVERSION: SHX5443

## 2018-07-16 HISTORY — PX: CORONARY ARTERY BYPASS GRAFT: SHX141

## 2018-07-16 LAB — POCT I-STAT 4, (NA,K, GLUC, HGB,HCT)
Glucose, Bld: 98 mg/dL (ref 70–99)
Glucose, Bld: 105 mg/dL — ABNORMAL HIGH (ref 70–99)
Glucose, Bld: 108 mg/dL — ABNORMAL HIGH (ref 70–99)
Glucose, Bld: 109 mg/dL — ABNORMAL HIGH (ref 70–99)
Glucose, Bld: 100 mg/dL — ABNORMAL HIGH (ref 70–99)
HCT: 31 % — ABNORMAL LOW (ref 39.0–52.0)
HCT: 25 % — ABNORMAL LOW (ref 39.0–52.0)
HCT: 29 % — ABNORMAL LOW (ref 39.0–52.0)
HCT: 23 % — ABNORMAL LOW (ref 39.0–52.0)
HCT: 25 % — ABNORMAL LOW (ref 39.0–52.0)
HEMOGLOBIN: 7.8 g/dL — AB (ref 13.0–17.0)
Hemoglobin: 10.5 g/dL — ABNORMAL LOW (ref 13.0–17.0)
Hemoglobin: 8.5 g/dL — ABNORMAL LOW (ref 13.0–17.0)
Hemoglobin: 9.9 g/dL — ABNORMAL LOW (ref 13.0–17.0)
Hemoglobin: 8.5 g/dL — ABNORMAL LOW (ref 13.0–17.0)
Potassium: 4 mmol/L (ref 3.5–5.1)
Potassium: 4 mmol/L (ref 3.5–5.1)
Potassium: 5.8 mmol/L — ABNORMAL HIGH (ref 3.5–5.1)
Potassium: 5.5 mmol/L — ABNORMAL HIGH (ref 3.5–5.1)
Potassium: 4.8 mmol/L (ref 3.5–5.1)
SODIUM: 138 mmol/L (ref 135–145)
Sodium: 139 mmol/L (ref 135–145)
Sodium: 138 mmol/L (ref 135–145)
Sodium: 139 mmol/L (ref 135–145)
Sodium: 132 mmol/L — ABNORMAL LOW (ref 135–145)

## 2018-07-16 LAB — CBC
HCT: 26.7 % — ABNORMAL LOW (ref 39.0–52.0)
HEMATOCRIT: 36.7 % — AB (ref 39.0–52.0)
HEMATOCRIT: 27.9 % — AB (ref 39.0–52.0)
Hemoglobin: 12.4 g/dL — ABNORMAL LOW (ref 13.0–17.0)
Hemoglobin: 9.4 g/dL — ABNORMAL LOW (ref 13.0–17.0)
Hemoglobin: 9.2 g/dL — ABNORMAL LOW (ref 13.0–17.0)
MCH: 33.3 pg (ref 26.0–34.0)
MCH: 33.5 pg (ref 26.0–34.0)
MCH: 34.6 pg — ABNORMAL HIGH (ref 26.0–34.0)
MCHC: 33.8 g/dL (ref 30.0–36.0)
MCHC: 33.7 g/dL (ref 30.0–36.0)
MCHC: 34.5 g/dL (ref 30.0–36.0)
MCV: 98.7 fL (ref 80.0–100.0)
MCV: 99.3 fL (ref 80.0–100.0)
MCV: 100.4 fL — ABNORMAL HIGH (ref 80.0–100.0)
PLATELETS: 133 10*3/uL — AB (ref 150–400)
Platelets: 188 10*3/uL (ref 150–400)
Platelets: 116 10*3/uL — ABNORMAL LOW (ref 150–400)
RBC: 3.72 MIL/uL — ABNORMAL LOW (ref 4.22–5.81)
RBC: 2.81 MIL/uL — ABNORMAL LOW (ref 4.22–5.81)
RBC: 2.66 MIL/uL — ABNORMAL LOW (ref 4.22–5.81)
RDW: 12.6 % (ref 11.5–15.5)
RDW: 12.5 % (ref 11.5–15.5)
RDW: 12.7 % (ref 11.5–15.5)
WBC: 8.4 10*3/uL (ref 4.0–10.5)
WBC: 8.9 10*3/uL (ref 4.0–10.5)
WBC: 8.1 10*3/uL (ref 4.0–10.5)
nRBC: 0 % (ref 0.0–0.2)
nRBC: 0 % (ref 0.0–0.2)
nRBC: 0 % (ref 0.0–0.2)

## 2018-07-16 LAB — POCT I-STAT 7, (LYTES, BLD GAS, ICA,H+H)
Acid-Base Excess: 6 mmol/L — ABNORMAL HIGH (ref 0.0–2.0)
Acid-Base Excess: 2 mmol/L (ref 0.0–2.0)
Acid-base deficit: 1 mmol/L (ref 0.0–2.0)
Acid-base deficit: 3 mmol/L — ABNORMAL HIGH (ref 0.0–2.0)
Bicarbonate: 29.7 mmol/L — ABNORMAL HIGH (ref 20.0–28.0)
Bicarbonate: 25.3 mmol/L (ref 20.0–28.0)
Bicarbonate: 24.1 mmol/L (ref 20.0–28.0)
Bicarbonate: 24.7 mmol/L (ref 20.0–28.0)
Bicarbonate: 22.7 mmol/L (ref 20.0–28.0)
CALCIUM ION: 1.11 mmol/L — AB (ref 1.15–1.40)
Calcium, Ion: 1.08 mmol/L — ABNORMAL LOW (ref 1.15–1.40)
Calcium, Ion: 1.11 mmol/L — ABNORMAL LOW (ref 1.15–1.40)
Calcium, Ion: 1.22 mmol/L (ref 1.15–1.40)
Calcium, Ion: 1.18 mmol/L (ref 1.15–1.40)
HCT: 26 % — ABNORMAL LOW (ref 39.0–52.0)
HCT: 26 % — ABNORMAL LOW (ref 39.0–52.0)
HCT: 22 % — ABNORMAL LOW (ref 39.0–52.0)
HCT: 24 % — ABNORMAL LOW (ref 39.0–52.0)
HCT: 23 % — ABNORMAL LOW (ref 39.0–52.0)
Hemoglobin: 8.8 g/dL — ABNORMAL LOW (ref 13.0–17.0)
Hemoglobin: 8.8 g/dL — ABNORMAL LOW (ref 13.0–17.0)
Hemoglobin: 7.5 g/dL — ABNORMAL LOW (ref 13.0–17.0)
Hemoglobin: 8.2 g/dL — ABNORMAL LOW (ref 13.0–17.0)
Hemoglobin: 7.8 g/dL — ABNORMAL LOW (ref 13.0–17.0)
O2 Saturation: 100 %
O2 Saturation: 100 %
O2 Saturation: 99 %
O2 Saturation: 99 %
O2 Saturation: 98 %
PCO2 ART: 35.3 mmHg (ref 32.0–48.0)
PCO2 ART: 44.6 mmHg (ref 32.0–48.0)
PO2 ART: 476 mmHg — AB (ref 83.0–108.0)
POTASSIUM: 3.6 mmol/L (ref 3.5–5.1)
Patient temperature: 37.3
Patient temperature: 37.4
Potassium: 5.8 mmol/L — ABNORMAL HIGH (ref 3.5–5.1)
Potassium: 4.5 mmol/L (ref 3.5–5.1)
Potassium: 4.9 mmol/L (ref 3.5–5.1)
Potassium: 4.7 mmol/L (ref 3.5–5.1)
SODIUM: 139 mmol/L (ref 135–145)
Sodium: 138 mmol/L (ref 135–145)
Sodium: 137 mmol/L (ref 135–145)
Sodium: 140 mmol/L (ref 135–145)
Sodium: 137 mmol/L (ref 135–145)
TCO2: 31 mmol/L (ref 22–32)
TCO2: 26 mmol/L (ref 22–32)
TCO2: 25 mmol/L (ref 22–32)
TCO2: 26 mmol/L (ref 22–32)
TCO2: 24 mmol/L (ref 22–32)
pCO2 arterial: 40.7 mmHg (ref 32.0–48.0)
pCO2 arterial: 31.6 mmHg — ABNORMAL LOW (ref 32.0–48.0)
pCO2 arterial: 42.8 mmHg (ref 32.0–48.0)
pH, Arterial: 7.471 — ABNORMAL HIGH (ref 7.350–7.450)
pH, Arterial: 7.463 — ABNORMAL HIGH (ref 7.350–7.450)
pH, Arterial: 7.483 — ABNORMAL HIGH (ref 7.350–7.450)
pH, Arterial: 7.352 (ref 7.350–7.450)
pH, Arterial: 7.335 — ABNORMAL LOW (ref 7.350–7.450)
pO2, Arterial: 280 mmHg — ABNORMAL HIGH (ref 83.0–108.0)
pO2, Arterial: 139 mmHg — ABNORMAL HIGH (ref 83.0–108.0)
pO2, Arterial: 140 mmHg — ABNORMAL HIGH (ref 83.0–108.0)
pO2, Arterial: 119 mmHg — ABNORMAL HIGH (ref 83.0–108.0)

## 2018-07-16 LAB — BASIC METABOLIC PANEL
Anion gap: 10 (ref 5–15)
BUN: 10 mg/dL (ref 8–23)
CO2: 26 mmol/L (ref 22–32)
Calcium: 9.2 mg/dL (ref 8.9–10.3)
Chloride: 104 mmol/L (ref 98–111)
Creatinine, Ser: 0.88 mg/dL (ref 0.61–1.24)
GFR calc Af Amer: >60 mL/min (ref >60)
GFR calc non Af Amer: >60 mL/min (ref >60)
Glucose, Bld: 99 mg/dL (ref 70–99)
Potassium: 3.9 mmol/L (ref 3.5–5.1)
Sodium: 140 mmol/L (ref 135–145)

## 2018-07-16 LAB — CREATININE, SERUM
Creatinine, Ser: 0.77 mg/dL (ref 0.61–1.24)
GFR calc Af Amer: >60 mL/min (ref >60)
GFR calc non Af Amer: >60 mL/min (ref >60)

## 2018-07-16 LAB — URINALYSIS, ROUTINE W REFLEX MICROSCOPIC
Bilirubin Urine: NEGATIVE
GLUCOSE, UA: NEGATIVE mg/dL
Hgb urine dipstick: NEGATIVE
Ketones, ur: NEGATIVE mg/dL
Leukocytes,Ua: NEGATIVE
Nitrite: NEGATIVE
Protein, ur: NEGATIVE mg/dL
SPECIFIC GRAVITY, URINE: 1.012 (ref 1.005–1.030)
pH: 5 (ref 5.0–8.0)

## 2018-07-16 LAB — PLATELET COUNT: Platelets: 134 10*3/uL — ABNORMAL LOW (ref 150–400)

## 2018-07-16 LAB — PREPARE RBC (CROSSMATCH)

## 2018-07-16 LAB — SURGICAL PCR SCREEN
MRSA, PCR: NEGATIVE
Staphylococcus aureus: POSITIVE — AB

## 2018-07-16 LAB — HEMOGLOBIN AND HEMATOCRIT, BLOOD
HCT: 28.3 % — ABNORMAL LOW (ref 39.0–52.0)
Hemoglobin: 9.7 g/dL — ABNORMAL LOW (ref 13.0–17.0)

## 2018-07-16 LAB — MAGNESIUM: Magnesium: 2.8 mg/dL — ABNORMAL HIGH (ref 1.7–2.4)

## 2018-07-16 LAB — GLUCOSE, CAPILLARY
GLUCOSE-CAPILLARY: 112 mg/dL — AB (ref 70–99)
Glucose-Capillary: 99 mg/dL (ref 70–99)
Glucose-Capillary: 97 mg/dL (ref 70–99)
Glucose-Capillary: 103 mg/dL — ABNORMAL HIGH (ref 70–99)

## 2018-07-16 LAB — HEPARIN LEVEL (UNFRACTIONATED): Heparin Unfractionated: 0.2 IU/mL — ABNORMAL LOW (ref 0.30–0.70)

## 2018-07-16 LAB — PROTIME-INR
INR: 1.4
PROTHROMBIN TIME: 17 s — AB (ref 11.4–15.2)

## 2018-07-16 LAB — APTT: aPTT: 31 seconds (ref 24–36)

## 2018-07-16 SURGERY — CORONARY ARTERY BYPASS GRAFTING (CABG)
Anesthesia: General | Site: Chest

## 2018-07-16 MED ORDER — SODIUM CHLORIDE 0.9% FLUSH
3.0000 mL | Freq: Two times a day (BID) | INTRAVENOUS | Status: DC
  Administered 2018-07-17 – 2018-07-22 (×12): 3 mL via INTRAVENOUS

## 2018-07-16 MED ORDER — ACETAMINOPHEN 160 MG/5ML PO SOLN: 1000.0000 mg | Freq: Four times a day (QID) | ORAL | Status: AC

## 2018-07-16 MED ORDER — METOPROLOL TARTRATE 5 MG/5ML IV SOLN: 2.5000 mg | INTRAVENOUS | Status: DC | PRN

## 2018-07-16 MED ORDER — DEXMEDETOMIDINE HCL IN NACL 200 MCG/50ML IV SOLN: 0.0000 ug/kg/h | INTRAVENOUS | Status: DC

## 2018-07-16 MED ORDER — SODIUM CHLORIDE 0.9 % IV SOLN
INTRAVENOUS | Status: DC | PRN
  Administered 2018-07-16: 750 mg via INTRAVENOUS

## 2018-07-16 MED ORDER — 0.9 % SODIUM CHLORIDE (POUR BTL) OPTIME
TOPICAL | Status: DC | PRN
  Administered 2018-07-16: 5000 mL

## 2018-07-16 MED ORDER — CHLORHEXIDINE GLUCONATE CLOTH 2 % EX PADS: 6.0000 | MEDICATED_PAD | Freq: Every day | CUTANEOUS | Status: DC

## 2018-07-16 MED ORDER — SODIUM CHLORIDE 0.9% IV SOLUTION
Freq: Once | INTRAVENOUS | Status: AC
  Administered 2018-07-16: 18:00:00 via INTRAVENOUS

## 2018-07-16 MED ORDER — CHLORHEXIDINE GLUCONATE 0.12 % MT SOLN
15.0000 mL | OROMUCOSAL | Status: AC
  Administered 2018-07-16: 15 mL via OROMUCOSAL

## 2018-07-16 MED ORDER — ONDANSETRON HCL 4 MG/2ML IJ SOLN
INTRAMUSCULAR | Status: DC | PRN
  Administered 2018-07-16: 4 mg via INTRAVENOUS

## 2018-07-16 MED ORDER — ORAL CARE MOUTH RINSE
15.0000 mL | OROMUCOSAL | Status: DC
  Administered 2018-07-16 (×2): 15 mL via OROMUCOSAL

## 2018-07-16 MED ORDER — ACETAMINOPHEN 160 MG/5ML PO SOLN: 650.0000 mg | Freq: Once | ORAL | Status: AC

## 2018-07-16 MED ORDER — INSULIN REGULAR(HUMAN) IN NACL 100-0.9 UT/100ML-% IV SOLN: INTRAVENOUS | Status: DC

## 2018-07-16 MED ORDER — CHLORHEXIDINE GLUCONATE CLOTH 2 % EX PADS
6.0000 | MEDICATED_PAD | Freq: Every day | CUTANEOUS | Status: AC
  Administered 2018-07-16 – 2018-07-20 (×5): 6 via TOPICAL

## 2018-07-16 MED ORDER — MORPHINE SULFATE (PF) 2 MG/ML IV SOLN
1.0000 mg | INTRAVENOUS | Status: DC | PRN
  Administered 2018-07-16: 2 mg via INTRAVENOUS
  Administered 2018-07-17: 1 mg via INTRAVENOUS
  Filled 2018-07-16 (×2): qty 1

## 2018-07-16 MED ORDER — ASPIRIN EC 325 MG PO TBEC
325.0000 mg | DELAYED_RELEASE_TABLET | Freq: Every day | ORAL | Status: DC
  Administered 2018-07-17 – 2018-07-24 (×8): 325 mg via ORAL
  Filled 2018-07-16 (×8): qty 1

## 2018-07-16 MED ORDER — SODIUM CHLORIDE 0.9% FLUSH: 10.0000 mL | INTRAVENOUS | Status: DC | PRN

## 2018-07-16 MED ORDER — BISACODYL 5 MG PO TBEC
10.0000 mg | DELAYED_RELEASE_TABLET | Freq: Every day | ORAL | Status: DC
  Administered 2018-07-17 – 2018-07-23 (×6): 10 mg via ORAL
  Filled 2018-07-16 (×7): qty 2

## 2018-07-16 MED ORDER — FAMOTIDINE IN NACL 20-0.9 MG/50ML-% IV SOLN
20.0000 mg | Freq: Two times a day (BID) | INTRAVENOUS | Status: AC
  Administered 2018-07-16 (×2): 20 mg via INTRAVENOUS
  Filled 2018-07-16: qty 50

## 2018-07-16 MED ORDER — PROPOFOL 10 MG/ML IV BOLUS
INTRAVENOUS | Status: DC | PRN
  Administered 2018-07-16: 130 mg via INTRAVENOUS

## 2018-07-16 MED ORDER — MUPIROCIN 2 % EX OINT
1.0000 "application " | TOPICAL_OINTMENT | Freq: Two times a day (BID) | CUTANEOUS | Status: DC
  Administered 2018-07-16: 1 via NASAL
  Filled 2018-07-16: qty 22

## 2018-07-16 MED ORDER — PHENYLEPHRINE HCL-NACL 20-0.9 MG/250ML-% IV SOLN
0.0000 ug/min | INTRAVENOUS | Status: DC
  Administered 2018-07-17: 20 ug/min via INTRAVENOUS
  Administered 2018-07-17: 40 ug/min via INTRAVENOUS
  Administered 2018-07-17: 70 ug/min via INTRAVENOUS
  Administered 2018-07-17: 45 ug/min via INTRAVENOUS
  Administered 2018-07-18: 50 ug/min via INTRAVENOUS
  Administered 2018-07-18: 55 ug/min via INTRAVENOUS
  Administered 2018-07-18: 40 ug/min via INTRAVENOUS
  Filled 2018-07-16 (×8): qty 250

## 2018-07-16 MED ORDER — ORAL CARE MOUTH RINSE
15.0000 mL | Freq: Two times a day (BID) | OROMUCOSAL | Status: DC
  Administered 2018-07-17 – 2018-07-19 (×3): 15 mL via OROMUCOSAL

## 2018-07-16 MED ORDER — PROTAMINE SULFATE 10 MG/ML IV SOLN
INTRAVENOUS | Status: DC | PRN
  Administered 2018-07-16: 240 mg via INTRAVENOUS
  Administered 2018-07-16: 10 mg via INTRAVENOUS

## 2018-07-16 MED ORDER — ONDANSETRON HCL 4 MG/2ML IJ SOLN: 4.0000 mg | Freq: Four times a day (QID) | INTRAMUSCULAR | Status: DC | PRN

## 2018-07-16 MED ORDER — CHLORHEXIDINE GLUCONATE 0.12% ORAL RINSE (MEDLINE KIT)
15.0000 mL | Freq: Two times a day (BID) | OROMUCOSAL | Status: DC
  Administered 2018-07-16 – 2018-07-19 (×5): 15 mL via OROMUCOSAL

## 2018-07-16 MED ORDER — INSULIN ASPART 100 UNIT/ML ~~LOC~~ SOLN: 0.0000 [IU] | SUBCUTANEOUS | Status: DC

## 2018-07-16 MED ORDER — LACTATED RINGERS IV SOLN
INTRAVENOUS | Status: DC
  Administered 2018-07-16: 13:00:00 via INTRAVENOUS

## 2018-07-16 MED ORDER — SODIUM CHLORIDE 0.9% FLUSH
3.0000 mL | INTRAVENOUS | Status: DC | PRN
  Administered 2018-07-23 (×2): 3 mL via INTRAVENOUS
  Filled 2018-07-16 (×2): qty 3

## 2018-07-16 MED ORDER — SODIUM CHLORIDE 0.9 % IV SOLN
1.5000 g | Freq: Two times a day (BID) | INTRAVENOUS | Status: AC
  Administered 2018-07-16 – 2018-07-18 (×4): 1.5 g via INTRAVENOUS
  Filled 2018-07-16 (×4): qty 1.5

## 2018-07-16 MED ORDER — HEMOSTATIC AGENTS (NO CHARGE) OPTIME
TOPICAL | Status: DC | PRN
  Administered 2018-07-16: 1 via TOPICAL

## 2018-07-16 MED ORDER — THROMBIN 20000 UNITS EX SOLR
OROMUCOSAL | Status: DC | PRN
  Administered 2018-07-16 (×3): 4 mL via TOPICAL

## 2018-07-16 MED ORDER — LACTATED RINGERS IV SOLN: INTRAVENOUS | Status: DC

## 2018-07-16 MED ORDER — ALBUMIN HUMAN 5 % IV SOLN
250.0000 mL | INTRAVENOUS | Status: AC | PRN
  Administered 2018-07-16 (×4): 12.5 g via INTRAVENOUS
  Filled 2018-07-16: qty 500
  Filled 2018-07-16: qty 250

## 2018-07-16 MED ORDER — SODIUM CHLORIDE 0.45 % IV SOLN
INTRAVENOUS | Status: DC | PRN
  Administered 2018-07-16: 13:00:00 via INTRAVENOUS

## 2018-07-16 MED ORDER — SODIUM CHLORIDE 0.9% FLUSH
10.0000 mL | Freq: Two times a day (BID) | INTRAVENOUS | Status: DC
  Administered 2018-07-16 – 2018-07-18 (×4): 10 mL

## 2018-07-16 MED ORDER — VECURONIUM BROMIDE 10 MG IV SOLR
INTRAVENOUS | Status: DC | PRN
  Administered 2018-07-16 (×2): 5 mg via INTRAVENOUS
  Administered 2018-07-16: 10 mg via INTRAVENOUS

## 2018-07-16 MED ORDER — NITROGLYCERIN IN D5W 200-5 MCG/ML-% IV SOLN: 0.0000 ug/min | INTRAVENOUS | Status: DC

## 2018-07-16 MED ORDER — LACTATED RINGERS IV SOLN: 500.0000 mL | Freq: Once | INTRAVENOUS | Status: DC | PRN

## 2018-07-16 MED ORDER — ACETAMINOPHEN 500 MG PO TABS
1000.0000 mg | ORAL_TABLET | Freq: Four times a day (QID) | ORAL | Status: AC
  Administered 2018-07-17 – 2018-07-21 (×19): 1000 mg via ORAL
  Filled 2018-07-16 (×19): qty 2

## 2018-07-16 MED ORDER — TRAMADOL HCL 50 MG PO TABS: 50.0000 mg | ORAL_TABLET | ORAL | Status: DC | PRN

## 2018-07-16 MED ORDER — ASPIRIN 81 MG PO CHEW
324.0000 mg | CHEWABLE_TABLET | Freq: Every day | ORAL | Status: DC
  Filled 2018-07-16: qty 4

## 2018-07-16 MED ORDER — BISACODYL 10 MG RE SUPP
10.0000 mg | Freq: Every day | RECTAL | Status: DC
  Filled 2018-07-16: qty 1

## 2018-07-16 MED ORDER — SUFENTANIL CITRATE 50 MCG/ML IV SOLN
INTRAVENOUS | Status: DC | PRN
  Administered 2018-07-16: 5 ug via INTRAVENOUS
  Administered 2018-07-16 (×6): 20 ug via INTRAVENOUS
  Administered 2018-07-16: 15 ug via INTRAVENOUS

## 2018-07-16 MED ORDER — ALBUMIN HUMAN 5 % IV SOLN
12.5000 g | Freq: Once | INTRAVENOUS | Status: AC
  Administered 2018-07-16: 12.5 g via INTRAVENOUS

## 2018-07-16 MED ORDER — THROMBIN 20000 UNITS EX SOLR
CUTANEOUS | Status: DC | PRN
  Administered 2018-07-16: 20000 [IU] via TOPICAL

## 2018-07-16 MED ORDER — MAGNESIUM SULFATE 4 GM/100ML IV SOLN
4.0000 g | Freq: Once | INTRAVENOUS | Status: AC
  Administered 2018-07-16: 4 g via INTRAVENOUS
  Filled 2018-07-16: qty 100

## 2018-07-16 MED ORDER — METOPROLOL TARTRATE 25 MG/10 ML ORAL SUSPENSION: 12.5000 mg | Freq: Two times a day (BID) | ORAL | Status: DC

## 2018-07-16 MED ORDER — ALBUMIN HUMAN 5 % IV SOLN
INTRAVENOUS | Status: DC | PRN
  Administered 2018-07-16 (×2): via INTRAVENOUS

## 2018-07-16 MED ORDER — MIDAZOLAM HCL 5 MG/5ML IJ SOLN
INTRAMUSCULAR | Status: DC | PRN
  Administered 2018-07-16 (×6): 1 mg via INTRAVENOUS

## 2018-07-16 MED ORDER — MUPIROCIN 2 % EX OINT
1.0000 "application " | TOPICAL_OINTMENT | Freq: Two times a day (BID) | CUTANEOUS | Status: AC
  Administered 2018-07-16 – 2018-07-20 (×10): 1 via NASAL
  Filled 2018-07-16: qty 22

## 2018-07-16 MED ORDER — SODIUM CHLORIDE 0.9 % IV SOLN: 250.0000 mL | INTRAVENOUS | Status: DC

## 2018-07-16 MED ORDER — OXYCODONE HCL 5 MG PO TABS
5.0000 mg | ORAL_TABLET | ORAL | Status: DC | PRN
  Administered 2018-07-19: 5 mg via ORAL
  Filled 2018-07-16: qty 1

## 2018-07-16 MED ORDER — MIDAZOLAM HCL 2 MG/2ML IJ SOLN: 2.0000 mg | INTRAMUSCULAR | Status: DC | PRN

## 2018-07-16 MED ORDER — INSULIN REGULAR BOLUS VIA INFUSION
0.0000 [IU] | Freq: Three times a day (TID) | INTRAVENOUS | Status: DC
  Filled 2018-07-16: qty 10

## 2018-07-16 MED ORDER — POTASSIUM CHLORIDE 10 MEQ/50ML IV SOLN
10.0000 meq | INTRAVENOUS | Status: AC
  Administered 2018-07-16 (×3): 10 meq via INTRAVENOUS

## 2018-07-16 MED ORDER — DOCUSATE SODIUM 100 MG PO CAPS
200.0000 mg | ORAL_CAPSULE | Freq: Every day | ORAL | Status: DC
  Administered 2018-07-17 – 2018-07-24 (×8): 200 mg via ORAL
  Filled 2018-07-16 (×8): qty 2

## 2018-07-16 MED ORDER — ACETAMINOPHEN 650 MG RE SUPP
650.0000 mg | Freq: Once | RECTAL | Status: AC
  Administered 2018-07-16: 650 mg via RECTAL

## 2018-07-16 MED ORDER — METOPROLOL TARTRATE 12.5 MG HALF TABLET
12.5000 mg | ORAL_TABLET | Freq: Two times a day (BID) | ORAL | Status: DC
  Filled 2018-07-16: qty 1

## 2018-07-16 MED ORDER — CHLORHEXIDINE GLUCONATE 0.12 % MT SOLN
15.0000 mL | Freq: Two times a day (BID) | OROMUCOSAL | Status: DC
  Administered 2018-07-16 – 2018-07-17 (×3): 15 mL via OROMUCOSAL
  Filled 2018-07-16 (×2): qty 15

## 2018-07-16 MED ORDER — SODIUM CHLORIDE 0.9 % IV SOLN
INTRAVENOUS | Status: DC
  Administered 2018-07-16: 13:00:00 via INTRAVENOUS

## 2018-07-16 MED ORDER — PANTOPRAZOLE SODIUM 40 MG PO TBEC
40.0000 mg | DELAYED_RELEASE_TABLET | Freq: Every day | ORAL | Status: DC
  Administered 2018-07-18 – 2018-07-24 (×7): 40 mg via ORAL
  Filled 2018-07-16 (×7): qty 1

## 2018-07-16 MED ORDER — VANCOMYCIN HCL IN DEXTROSE 1-5 GM/200ML-% IV SOLN
1000.0000 mg | Freq: Once | INTRAVENOUS | Status: AC
  Administered 2018-07-16: 1000 mg via INTRAVENOUS
  Filled 2018-07-16: qty 200

## 2018-07-16 MED ORDER — LACTATED RINGERS IV SOLN
INTRAVENOUS | Status: DC | PRN
  Administered 2018-07-16 (×4): via INTRAVENOUS

## 2018-07-16 SURGICAL SUPPLY — 88 items
ADH SKN CLS APL DERMABOND .7 (GAUZE/BANDAGES/DRESSINGS) ×3
BAG DECANTER FOR FLEXI CONT (MISCELLANEOUS) ×4 IMPLANT
BANDAGE ACE 4X5 VEL STRL LF (GAUZE/BANDAGES/DRESSINGS) ×4 IMPLANT
BANDAGE ACE 6X5 VEL STRL LF (GAUZE/BANDAGES/DRESSINGS) ×4 IMPLANT
BANDAGE ELASTIC 4 VELCRO ST LF (GAUZE/BANDAGES/DRESSINGS) ×2 IMPLANT
BANDAGE ELASTIC 6 VELCRO ST LF (GAUZE/BANDAGES/DRESSINGS) ×3 IMPLANT
BASKET HEART (ORDER IN 25'S) (MISCELLANEOUS) ×1
BASKET HEART (ORDER IN 25S) (MISCELLANEOUS) ×3 IMPLANT
BLADE STERNUM SYSTEM 6 (BLADE) ×4 IMPLANT
BLADE SURG 15 STRL LF DISP TIS (BLADE) ×3 IMPLANT
BLADE SURG 15 STRL SS (BLADE) ×4
BNDG GAUZE ELAST 4 BULKY (GAUZE/BANDAGES/DRESSINGS) ×4 IMPLANT
CANISTER SUCT 3000ML PPV (MISCELLANEOUS) ×4 IMPLANT
CATH ROBINSON RED A/P 18FR (CATHETERS) ×8 IMPLANT
CATH THORACIC 28FR (CATHETERS) ×4 IMPLANT
CATH THORACIC 36FR (CATHETERS) ×4 IMPLANT
CATH THORACIC 36FR RT ANG (CATHETERS) ×4 IMPLANT
CLIP VESOCCLUDE SM WIDE 24/CT (CLIP) ×2 IMPLANT
COVER MAYO STAND STRL (DRAPES) ×4 IMPLANT
COVER WAND RF STERILE (DRAPES) ×4 IMPLANT
CRADLE DONUT ADULT HEAD (MISCELLANEOUS) ×4 IMPLANT
DERMABOND ADVANCED (GAUZE/BANDAGES/DRESSINGS) ×1
DERMABOND ADVANCED .7 DNX12 (GAUZE/BANDAGES/DRESSINGS) ×1 IMPLANT
DRAPE CARDIOVASCULAR INCISE (DRAPES) ×4
DRAPE HALF SHEET 40X57 (DRAPES) ×8 IMPLANT
DRAPE SLUSH/WARMER DISC (DRAPES) ×4 IMPLANT
DRAPE SRG 135X102X78XABS (DRAPES) ×3 IMPLANT
DRSG COVADERM 4X14 (GAUZE/BANDAGES/DRESSINGS) ×4 IMPLANT
ELECT BLADE 4.0 EZ CLEAN MEGAD (MISCELLANEOUS) ×4
ELECT CAUTERY BLADE 6.4 (BLADE) ×4 IMPLANT
ELECT REM PT RETURN 9FT ADLT (ELECTROSURGICAL) ×8
ELECTRODE BLDE 4.0 EZ CLN MEGD (MISCELLANEOUS) ×1 IMPLANT
ELECTRODE REM PT RTRN 9FT ADLT (ELECTROSURGICAL) ×6 IMPLANT
FELT TEFLON 1X6 (MISCELLANEOUS) ×6 IMPLANT
GAUZE SPONGE 4X4 12PLY STRL (GAUZE/BANDAGES/DRESSINGS) ×8 IMPLANT
GAUZE SPONGE 4X4 12PLY STRL LF (GAUZE/BANDAGES/DRESSINGS) ×4 IMPLANT
GEL ULTRASOUND 20GR AQUASONIC (MISCELLANEOUS) ×4 IMPLANT
GLOVE BIO SURGEON STRL SZ 6 (GLOVE) IMPLANT
GLOVE EUDERMIC 7 POWDERFREE (GLOVE) ×8 IMPLANT
GOWN STRL REUS W/ TWL LRG LVL3 (GOWN DISPOSABLE) ×12 IMPLANT
GOWN STRL REUS W/ TWL XL LVL3 (GOWN DISPOSABLE) ×5 IMPLANT
GOWN STRL REUS W/TWL LRG LVL3 (GOWN DISPOSABLE) ×16
GOWN STRL REUS W/TWL XL LVL3 (GOWN DISPOSABLE) ×8
HEMOSTAT POWDER SURGIFOAM 1G (HEMOSTASIS) ×12 IMPLANT
HEMOSTAT SURGICEL 2X14 (HEMOSTASIS) ×4 IMPLANT
KIT BASIN OR (CUSTOM PROCEDURE TRAY) ×4 IMPLANT
KIT CATH CPB BARTLE (MISCELLANEOUS) ×4 IMPLANT
KIT SUCTION CATH 14FR (SUCTIONS) ×4 IMPLANT
KIT TURNOVER KIT B (KITS) ×4 IMPLANT
KIT VASOVIEW HEMOPRO 2 VH 4000 (KITS) ×4 IMPLANT
NS IRRIG 1000ML POUR BTL (IV SOLUTION) ×20 IMPLANT
PACK E OPEN HEART (SUTURE) ×4 IMPLANT
PACK OPEN HEART (CUSTOM PROCEDURE TRAY) ×4 IMPLANT
PAD ARMBOARD 7.5X6 YLW CONV (MISCELLANEOUS) ×8 IMPLANT
PAD ELECT DEFIB RADIOL ZOLL (MISCELLANEOUS) ×4 IMPLANT
PENCIL BUTTON HOLSTER BLD 10FT (ELECTRODE) ×4 IMPLANT
PUNCH AORTIC ROTATE 4.5MM 8IN (MISCELLANEOUS) ×4 IMPLANT
SET CARDIOPLEGIA MPS 5001102 (MISCELLANEOUS) ×2 IMPLANT
SOLUTION ANTI FOG 6CC (MISCELLANEOUS) ×2 IMPLANT
SPONGE LAP 4X18 RFD (DISPOSABLE) ×6 IMPLANT
SUT BONE WAX W31G (SUTURE) ×4 IMPLANT
SUT MNCRL AB 4-0 PS2 18 (SUTURE) ×4 IMPLANT
SUT PROLENE 3 0 SH1 36 (SUTURE) ×4 IMPLANT
SUT PROLENE 5 0 C 1 36 (SUTURE) IMPLANT
SUT PROLENE 6 0 C 1 30 (SUTURE) ×2 IMPLANT
SUT PROLENE 7 0 BV 1 (SUTURE) ×4 IMPLANT
SUT PROLENE 7 0 BV1 MDA (SUTURE) ×4 IMPLANT
SUT SILK 2 0 SH (SUTURE) ×2 IMPLANT
SUT STEEL 6MS V (SUTURE) ×4 IMPLANT
SUT STEEL STERNAL CCS#1 18IN (SUTURE) IMPLANT
SUT VIC AB 1 CTX 36 (SUTURE) ×8
SUT VIC AB 1 CTX36XBRD ANBCTR (SUTURE) ×6 IMPLANT
SUT VIC AB 2-0 CT1 27 (SUTURE) ×8
SUT VIC AB 2-0 CT1 TAPERPNT 27 (SUTURE) ×2 IMPLANT
SUT VIC AB 2-0 CTX 27 (SUTURE) IMPLANT
SUT VIC AB 3-0 X1 27 (SUTURE) ×4 IMPLANT
SUT VICRYL 4-0 PS2 18IN ABS (SUTURE) IMPLANT
SYR 50ML SLIP (SYRINGE) ×4 IMPLANT
SYSTEM SAHARA CHEST DRAIN ATS (WOUND CARE) ×4 IMPLANT
TAPE CLOTH SURG 6X10 WHT LF (GAUZE/BANDAGES/DRESSINGS) ×2 IMPLANT
TAPE PAPER 3X10 WHT MICROPORE (GAUZE/BANDAGES/DRESSINGS) ×2 IMPLANT
TOWEL GREEN STERILE (TOWEL DISPOSABLE) ×4 IMPLANT
TOWEL GREEN STERILE FF (TOWEL DISPOSABLE) ×4 IMPLANT
TRAY FOLEY SLVR 16FR TEMP STAT (SET/KITS/TRAYS/PACK) ×4 IMPLANT
TUBE SUCTION CARDIAC 10FR (CANNULA) ×2 IMPLANT
TUBING INSUFFLATION (TUBING) ×4 IMPLANT
UNDERPAD 30X30 (UNDERPADS AND DIAPERS) ×4 IMPLANT
WATER STERILE IRR 1000ML POUR (IV SOLUTION) ×8 IMPLANT

## 2018-07-16 NOTE — Anesthesia Postprocedure Evaluation (Signed)
Anesthesia Post Note  Patient: Brian Terry  Procedure(s) Performed: CORONARY ARTERY BYPASS GRAFTING (CABG), ON PUMP, TIMES THREE, USING LEFT INTERNAL MAMMARY ARTERY AND LEFT GREATER SAHENOUS VEIN HARVESTED ENDOSCOPICALLY (N/A Chest) TRANSESOPHAGEAL ECHOCARDIOGRAM (TEE) (N/A )     Anesthesia Post Evaluation  Last Vitals:  Vitals:   07/16/18 1400 07/16/18 1402  BP:  94/79  Pulse: 80 80  Resp: 12 12  Temp: (!) 35.6 C (!) 35.6 C  SpO2: 100% 100%    Last Pain:  Vitals:   07/16/18 1300  TempSrc: Core  PainSc:                  [place] Leap DANIEL

## 2018-07-16 NOTE — Anesthesia Procedure Notes (Signed)
Arterial Line Insertion Start/End2/13/2020 6:45 AM, 07/16/2018 6:55 AM Performed by: Moshe Salisbury, CRNA, CRNA  Patient location: Pre-op. Preanesthetic checklist: patient identified, IV checked, site marked, risks and benefits discussed, surgical consent, monitors and equipment checked, pre-op evaluation, timeout performed and anesthesia consent Lidocaine 1% used for infiltration and patient sedated Left, radial was placed Catheter size: 20 G Hand hygiene performed , maximum sterile barriers used  and Seldinger technique used Allen's test indicative of satisfactory collateral circulation Attempts: 1 Procedure performed without using ultrasound guided technique. Following insertion, dressing applied and Biopatch. Post procedure assessment: normal  Patient tolerated the procedure well with no immediate complications.

## 2018-07-16 NOTE — Progress Notes (Signed)
[facility] BRIEF SICU PROGRESS NOTE  Day of Surgery  S/P Procedure(s) (LRB): CORONARY ARTERY BYPASS GRAFTING (CABG), ON PUMP, TIMES THREE, USING LEFT INTERNAL MAMMARY ARTERY AND LEFT GREATER SAHENOUS VEIN HARVESTED ENDOSCOPICALLY (N/A) TRANSESOPHAGEAL ECHOCARDIOGRAM (TEE) (N/A)   Extubated uneventfully AAI paced w/ stable hemodynamics Breathing comfortably w/ O2 sats 100% Chest tube output low UOP adequate  Plan: Continue routine early postop  Rexene Alberts, MD 07/16/2018 7:08 PM

## 2018-07-16 NOTE — Transfer of Care (Signed)
Immediate Anesthesia Transfer of Care Note  Patient: Brian Terry  Procedure(s) Performed: CORONARY ARTERY BYPASS GRAFTING (CABG), ON PUMP, TIMES THREE, USING LEFT INTERNAL MAMMARY ARTERY AND LEFT GREATER SAHENOUS VEIN HARVESTED ENDOSCOPICALLY (N/A Chest) TRANSESOPHAGEAL ECHOCARDIOGRAM (TEE) (N/A )  Patient Location: ICU  Anesthesia Type:General  Level of Consciousness: unresponsive and Patient remains intubated per anesthesia plan  Airway & Oxygen Therapy: Patient remains intubated per anesthesia plan and Patient placed on Ventilator (see vital sign flow sheet for setting)  Post-op Assessment: Report given to RN and Post -op Vital signs reviewed and stable  Post vital signs: Reviewed and stable  Last Vitals:  Vitals Value Taken Time  BP    Temp    Pulse 84 07/16/2018 12:45 PM  Resp 12 07/16/2018 12:45 PM  SpO2 100 % 07/16/2018 12:45 PM  Vitals shown include unvalidated device data.  Last Pain:  Vitals:   07/16/18 0427  TempSrc: Oral  PainSc: 0-No pain         Complications: No apparent anesthesia complications

## 2018-07-16 NOTE — Progress Notes (Signed)
  Echocardiogram Echocardiogram Transesophageal has been performed.  Brian Terry 07/16/2018, 9:23 AM

## 2018-07-16 NOTE — Anesthesia Procedure Notes (Signed)
Central Venous Catheter Insertion Performed by: Duane Boston, MD, anesthesiologist Start/End2/13/2020 6:41 AM, 07/16/2018 6:51 AM Patient location: Pre-op. Preanesthetic checklist: patient identified, IV checked, site marked, risks and benefits discussed, surgical consent, monitors and equipment checked, pre-op evaluation, timeout performed and anesthesia consent Position: Trendelenburg Lidocaine 1% used for infiltration and patient sedated Hand hygiene performed , maximum sterile barriers used  and Seldinger technique used Catheter size: 8.5 Fr Total catheter length 8. PA cath was placed.Sheath introducer Swan type:thermodilution PA Cath depth:50 Procedure performed using ultrasound guided technique. Ultrasound Notes:anatomy identified, needle tip was noted to be adjacent to the nerve/plexus identified, no ultrasound evidence of intravascular and/or intraneural injection and image(s) printed for medical record Attempts: 1 Following insertion, line sutured, dressing applied and Biopatch. Post procedure assessment: free fluid flow, blood return through all ports and no air  Patient tolerated the procedure well with no immediate complications.

## 2018-07-16 NOTE — Anesthesia Procedure Notes (Signed)
Procedure Name: Intubation Date/Time: 07/16/2018 7:50 AM Performed by: Moshe Salisbury, CRNA Pre-anesthesia Checklist: Patient identified, Emergency Drugs available, Suction available and Patient being monitored Patient Re-evaluated:Patient Re-evaluated prior to induction Oxygen Delivery Method: Circle System Utilized Preoxygenation: Pre-oxygenation with 100% oxygen Induction Type: IV induction Ventilation: Mask ventilation without difficulty Laryngoscope Size: Mac and 4 Grade View: Grade I Tube type: Oral Tube size: 8.0 mm Number of attempts: 1 Airway Equipment and Method: Stylet Placement Confirmation: ETT inserted through vocal cords under direct vision,  positive ETCO2 and breath sounds checked- equal and bilateral Secured at: 22 cm Tube secured with: Tape Dental Injury: Teeth and Oropharynx as per pre-operative assessment

## 2018-07-16 NOTE — Brief Op Note (Signed)
07/14/2018 - 07/16/2018  10:56 AM  PATIENT:  Brian Terry  78 y.o. male  PRE-OPERATIVE DIAGNOSIS:  CAD  POST-OPERATIVE DIAGNOSIS:  CAD  PROCEDURE:  CORONARY ARTERY BYPASS GRAFTING, ON PUMP, TIMES THREE, USING LEFT INTERNAL MAMMARY ARTERY AND LEFT GREATER SAHENOUS VEIN HARVESTED ENDOSCOPICALLY   LIMA -> LAD SVG -> OM SVG -> DISTAL RCA  TRANSESOPHAGEAL ECHOCARDIOGRAM (TEE) (N/A)  SURGEON:  Surgeon(s) and Role:    * Bartle, Fernande Boyden, MD - Primary  PHYSICIAN ASSISTANT:  Macarthur Critchley, PA-C    ANESTHESIA:   general  BLOOD ADMINISTERED:none  DRAINS: Left pleural and mediastinal tubes   LOCAL MEDICATIONS USED:  NONE  SPECIMEN:  No Specimen  DISPOSITION OF SPECIMEN:  N/A  COUNTS:  YES  DICTATION: .Dragon Dictation  PLAN OF CARE: Admit to inpatient   PATIENT DISPOSITION:  ICU - intubated and hemodynamically stable.   Delay start of Pharmacological VTE agent (>24hrs) due to surgical blood loss or risk of bleeding: yes

## 2018-07-16 NOTE — Op Note (Signed)
CARDIOVASCULAR SURGERY OPERATIVE NOTE  07/16/2018  Surgeon:  [name], MD  First Assistant: Enid Cutter,  PA-C   Preoperative Diagnosis:  Severe multi-vessel coronary artery disease   Postoperative Diagnosis:  Same   Procedure:  1. Median Sternotomy 2. Extracorporeal circulation 3.   Coronary artery bypass grafting x 3   Left internal mammary graft to the LAD  SVG to OM  SVG to RCA  4.   Endoscopic vein harvest from the left leg   Anesthesia:  General Endotracheal   Clinical History/Surgical Indication:  This 78 year old gentleman has 60% distal left main and severe three-vessel coronary disease with preserved left ventricular systolic function and a markedly abnormal exercise treadmill test.  I suspect that he probably is having some mild anginal symptoms the longer I talked to him.  He did have a episode of dizziness after bending over earlier today that I suspect this was probably a vagal episode made worse by a sublingual nitroglycerin.  I agree that coronary bypass graft surgery is the best treatment to prevent further ischemia and infarction and improve his quality of life.  He has had a previous right greater saphenous vein stripping.  I reviewed the cardiac catheterization images with the patient and his wife and answered all their questions.  I discussed the operative procedure with the patient and his wife including alternatives, benefits and risks; including but not limited to bleeding, blood transfusion, infection, stroke, myocardial infarction, graft failure, heart block requiring a permanent pacemaker, organ dysfunction, and death.  Sheryle Spray understands and agrees to proceed.     Preparation:  The patient was seen in the preoperative holding area and the correct patient, correct operation were confirmed with the patient after reviewing the medical  record and catheterization. The consent was signed by me. Preoperative antibiotics were given. A pulmonary arterial line and radial arterial line were placed by the anesthesia team. The patient was taken back to the operating room and positioned supine on the operating room table. After being placed under general endotracheal anesthesia by the anesthesia team a foley catheter was placed. The neck, chest, abdomen, and both legs were prepped with betadine soap and solution and draped in the usual sterile manner. A surgical time-out was taken and the correct patient and operative procedure were confirmed with the nursing and anesthesia staff.   Cardiopulmonary Bypass:  A median sternotomy was performed. The pericardium was opened in the midline. Right ventricular function appeared normal. The ascending aorta was of normal size and had no palpable plaque. There were no contraindications to aortic cannulation or cross-clamping. The patient was fully systemically heparinized and the ACT was maintained > 400 sec. The proximal aortic arch was cannulated with a 88F aortic cannula for arterial inflow. Venous cannulation was performed via the right atrial appendage using a two-staged venous cannula. An antegrade cardioplegia/vent cannula was inserted into the mid-ascending aorta. Aortic occlusion was performed with a single cross-clamp. Systemic cooling to 32 degrees Centigrade and topical cooling of the heart with iced saline were used. Hyperkalemic antegrade cold blood cardioplegia was used to induce diastolic arrest and was then given at about 20 minute intervals throughout the period of arrest to maintain myocardial temperature at or below 10 degrees centigrade. A temperature probe was inserted into the interventricular septum and an insulating pad was placed in the pericardium.   Left internal mammary harvest:  The left side of the sternum was retracted using the Rultract retractor. The left internal mammary  artery was  harvested as a pedicle graft. All side branches were clipped. It was a medium-sized vessel of good quality with excellent blood flow. It was ligated distally and divided. It was sprayed with topical papaverine solution to prevent vasospasm.   Endoscopic vein harvest:  The left greater saphenous vein was harvested endoscopically through a 2 cm incision medial to the left knee. It was harvested from the upper thigh to below the knee. It was a medium-sized vein of good quality. The side branches were all ligated with 4-0 silk ties.    Coronary arteries:  The coronary arteries were examined.   LAD:  Large vessel with no distal disease  LCX:  Intramyocardial. Located in the proximal to mid-portion in the muscle and it was a large vessel with no disease at the anastomotic site.  RCA:  Diffusely diseased throughout the proximal and mid-portions. The distal vessel just before the PDA origin was large with minimal disease.   Grafts:  1. LIMA to the LAD: 2.5 mm. It was sewn end to side using 8-0 prolene continuous suture. 2. SVG to OM:  2.0 mm. It was sewn end to side using 7-0 prolene continuous suture. 3. SVG to distal RCA:  3.0 mm. It was sewn end to side using 7-0 prolene continuous suture.   The proximal vein graft anastomoses were performed to the mid-ascending aorta using continuous 6-0 prolene suture. Graft markers were placed around the proximal anastomoses.   Completion:  The patient was rewarmed to 37 degrees Centigrade. The clamp was removed from the LIMA pedicle and there was rapid warming of the septum and return of ventricular fibrillation. The crossclamp was removed with a time of 70 minutes. There was spontaneous return of sinus rhythm. The distal and proximal anastomoses were checked for hemostasis. The position of the grafts was satisfactory. Two temporary epicardial pacing wires were placed on the right atrium and two on the right ventricle. The patient was weaned  from CPB without difficulty on no inotropes. CPB time was 88 minutes. Cardiac output was 5 LPM. TEE showed normal LV systolic function. Heparin was fully reversed with protamine and the aortic and venous cannulas removed. Hemostasis was achieved. Mediastinal and left pleural drainage tubes were placed. The sternum was closed with  #6 stainless steel wires. The fascia was closed with continuous # 1 vicryl suture. The subcutaneous tissue was closed with 2-0 vicryl continuous suture. The skin was closed with 3-0 vicryl subcuticular suture. All sponge, needle, and instrument counts were reported correct at the end of the case. Dry sterile dressings were placed over the incisions and around the chest tubes which were connected to pleurevac suction. The patient was then transported to the surgical intensive care unit in stable condition.

## 2018-07-16 NOTE — Procedures (Signed)
Extubation Procedure Note  Patient Details:   Name: Cleland Simkins DOB: [birth year] MRN: [id number]   Airway Documentation:    Vent end date: 07/16/18 Vent end time: 1808   Evaluation  O2 sats: stable throughout Complications: No apparent complications Patient did tolerate procedure well. Bilateral Breath Sounds: Clear   Yes   Pt was extubated without incident and placed on 3 L [place]. Performed Nif 9-20) and VC (700) prior to extubation. Incentive Spirometry was given to the pt. Pt is stable at this time.  Ronaldo Miyamoto 07/16/2018, 6:16 PM

## 2018-07-16 NOTE — Plan of Care (Signed)
  Problem: Education: Goal: Will demonstrate proper wound care and an understanding of methods to prevent future damage Outcome: Not Met (add Reason) Goal: Knowledge of disease or condition will improve Outcome: Not Met (add Reason) Goal: Knowledge of the prescribed therapeutic regimen will improve Outcome: Not Met (add Reason) Goal: Individualized Educational Video(s) Outcome: Not Met (add Reason)   Problem: Activity: Goal: Risk for activity intolerance will decrease Outcome: Not Met (add Reason)   Problem: Cardiac: Goal: Will achieve and/or maintain hemodynamic stability Outcome: Not Met (add Reason)   Problem: Clinical Measurements: Goal: Postoperative complications will be avoided or minimized Outcome: Not Met (add Reason)   Problem: Respiratory: Goal: Respiratory status will improve Outcome: Not Met (add Reason)   Problem: Skin Integrity: Goal: Wound healing without signs and symptoms of infection Outcome: Not Met (add Reason) Goal: Risk for impaired skin integrity will decrease Outcome: Not Met (add Reason)   Problem: Urinary Elimination: Goal: Ability to achieve and maintain adequate renal perfusion and functioning will improve Outcome: Not Met (add Reason)

## 2018-07-17 ENCOUNTER — Other Ambulatory Visit: Payer: Self-pay

## 2018-07-17 ENCOUNTER — Inpatient Hospital Stay (HOSPITAL_COMMUNITY): Payer: Medicare Other

## 2018-07-17 ENCOUNTER — Encounter (HOSPITAL_COMMUNITY): Payer: Self-pay | Admitting: Surgery

## 2018-07-17 LAB — BASIC METABOLIC PANEL
Anion gap: 8 (ref 5–15)
Anion gap: 8 (ref 5–15)
BUN: 7 mg/dL — ABNORMAL LOW (ref 8–23)
BUN: 12 mg/dL (ref 8–23)
CALCIUM: 8.1 mg/dL — AB (ref 8.9–10.3)
CO2: 22 mmol/L (ref 22–32)
CO2: 22 mmol/L (ref 22–32)
Calcium: 8.7 mg/dL — ABNORMAL LOW (ref 8.9–10.3)
Chloride: 107 mmol/L (ref 98–111)
Chloride: 107 mmol/L (ref 98–111)
Creatinine, Ser: 0.74 mg/dL (ref 0.61–1.24)
Creatinine, Ser: 0.78 mg/dL (ref 0.61–1.24)
GFR calc Af Amer: >60 mL/min (ref >60)
GFR calc Af Amer: >60 mL/min (ref >60)
GFR calc non Af Amer: >60 mL/min (ref >60)
Glucose, Bld: 109 mg/dL — ABNORMAL HIGH (ref 70–99)
Glucose, Bld: 156 mg/dL — ABNORMAL HIGH (ref 70–99)
Potassium: 4.2 mmol/L (ref 3.5–5.1)
Potassium: 4.2 mmol/L (ref 3.5–5.1)
Sodium: 137 mmol/L (ref 135–145)
Sodium: 137 mmol/L (ref 135–145)

## 2018-07-17 LAB — CBC
HCT: 25.3 % — ABNORMAL LOW (ref 39.0–52.0)
HCT: 27.3 % — ABNORMAL LOW (ref 39.0–52.0)
Hemoglobin: 8.3 g/dL — ABNORMAL LOW (ref 13.0–17.0)
Hemoglobin: 9.3 g/dL — ABNORMAL LOW (ref 13.0–17.0)
MCH: 33.1 pg (ref 26.0–34.0)
MCH: 34.2 pg — ABNORMAL HIGH (ref 26.0–34.0)
MCHC: 32.8 g/dL (ref 30.0–36.0)
MCHC: 34.1 g/dL (ref 30.0–36.0)
MCV: 100.8 fL — ABNORMAL HIGH (ref 80.0–100.0)
MCV: 100.4 fL — ABNORMAL HIGH (ref 80.0–100.0)
Platelets: 160 10*3/uL (ref 150–400)
Platelets: 185 10*3/uL (ref 150–400)
RBC: 2.51 MIL/uL — ABNORMAL LOW (ref 4.22–5.81)
RBC: 2.72 MIL/uL — ABNORMAL LOW (ref 4.22–5.81)
RDW: 12.6 % (ref 11.5–15.5)
RDW: 13 % (ref 11.5–15.5)
WBC: 9 10*3/uL (ref 4.0–10.5)
WBC: 12.8 10*3/uL — ABNORMAL HIGH (ref 4.0–10.5)
nRBC: 0 % (ref 0.0–0.2)
nRBC: 0 % (ref 0.0–0.2)

## 2018-07-17 LAB — GLUCOSE, CAPILLARY
GLUCOSE-CAPILLARY: 106 mg/dL — AB (ref 70–99)
Glucose-Capillary: 118 mg/dL — ABNORMAL HIGH (ref 70–99)
Glucose-Capillary: 111 mg/dL — ABNORMAL HIGH (ref 70–99)
Glucose-Capillary: 59 mg/dL — ABNORMAL LOW (ref 70–99)
Glucose-Capillary: 90 mg/dL (ref 70–99)

## 2018-07-17 LAB — PREPARE PLATELET PHERESIS: Unit division: 0

## 2018-07-17 LAB — BPAM PLATELET PHERESIS
Blood Product Expiration Date: 202002152359
ISSUE DATE / TIME: 202002131757
Unit Type and Rh: 600

## 2018-07-17 LAB — MAGNESIUM
Magnesium: 2.4 mg/dL (ref 1.7–2.4)
Magnesium: 2.3 mg/dL (ref 1.7–2.4)

## 2018-07-17 MED ORDER — TRAMADOL HCL 50 MG PO TABS: 50.0000 mg | ORAL_TABLET | ORAL | Status: DC | PRN

## 2018-07-17 MED ORDER — FERROUS GLUCONATE 324 (38 FE) MG PO TABS
324.0000 mg | ORAL_TABLET | Freq: Two times a day (BID) | ORAL | Status: DC
  Administered 2018-07-18: 324 mg via ORAL
  Filled 2018-07-17 (×2): qty 1

## 2018-07-17 MED ORDER — ALBUMIN HUMAN 5 % IV SOLN
12.5000 g | Freq: Once | INTRAVENOUS | Status: AC
  Administered 2018-07-17: 12.5 g via INTRAVENOUS

## 2018-07-17 MED ORDER — ALBUMIN HUMAN 5 % IV SOLN
INTRAVENOUS | Status: AC
  Filled 2018-07-17: qty 250

## 2018-07-17 MED ORDER — HEPARIN SODIUM (PORCINE) 1000 UNIT/ML IJ SOLN
INTRAMUSCULAR | Status: DC | PRN
  Administered 2018-07-16: 25000 [IU] via INTRAVENOUS

## 2018-07-17 MED FILL — Thrombin (Recombinant) For Soln 20000 Unit: CUTANEOUS | Qty: 1 | Status: AC

## 2018-07-17 NOTE — Discharge Summary (Signed)
Physician Discharge Summary  Patient ID: [name] MRN: [id number] DOB/AGE: 12-17-1940 78 y.o.  Admit date: 07/14/2018 Discharge date: 07/24/2018  Admission Diagnoses: Patient Active Problem List   Diagnosis Date Noted  . CAD (coronary artery disease) 07/14/2018  . CAD in native artery 07/06/2018  . Abnormal stress test 06/29/2018  . Medication management 06/29/2018   Syncope   . Hyperlipidemia 06/29/2018  . Esophageal stricture 03/01/2015  . Duodenitis 03/01/2015  . Dysphagia, pharyngoesophageal phase 02/24/2015  . GILBERT'S SYNDROME 10/22/2006  . ANHIDROSIS  10/22/2006    Discharge Diagnoses:   Patient Active Problem List   Diagnosis Date Noted  . 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 CAD (coronary artery disease) 07/14/2018  . CAD in native artery 07/06/2018  . Abnormal stress test 06/29/2018  . Medication management 06/29/2018  . Hyperlipidemia 06/29/2018  . Esophageal stricture 03/01/2015  . Duodenitis 03/01/2015  . Dysphagia, pharyngoesophageal phase 02/24/2015  . GILBERT'S SYNDROME 10/22/2006  . ANHIDROSIS 10/22/2006    Discharged Condition: good  History of Present Illness: [name] is a 78 year old gentleman who referred to [place] earlier this monht with  60% distal left main and severe three-vessel coronary disease with preserved left ventricular systolic function and a markedly abnormal exercise treadmill test. He was evaluated by Dr. Mohammed Kindle in the outpatient setting and was evaluated by Dr. Mohammed Kindle in the outpatient setting and was scheduled for elective CABG on 07/16/18.  He presented to the ED on 07/14/18 with the primary complaint of multiple syncopal episodes over the previous several days.   In the emergency room his blood pressure was in the 80s with some bradycardia.  His blood pressure improved with fluids.  Dr. Cyndia Bent was consulted and he felt it would be prudent  to admit him to the hospital for continued telemetry observation and intravenous heparin.    Hospital Course:  After admission, he was noted to have episodes of bradycardia so his beta-blocker was held.  His blood pressure and cardiac rhythm stabilized.  He did have some mild chest discomfort prior to surgery.  He was taken to the operating room on 07/16/2018 where three-vessel coronary bypass grafting was accomplished using the left internal mammary artery and endoscopically harvested saphenous vein from the left thigh.  He tolerated the procedure well and separated from cardiopulmonary bypass on Neo-Synephrine.  He was transferred to the CVICU intubated.  He remained hemodynamically stable.  He was extubated routinely in the early evening hours following surgery.  Neo-Synephrine was weaned on the first postoperative day.  Beta-blocker was withheld due to marked sinus bradycardia preoperatively.  His glucose was monitored and sliding scale insulin provided as necessary.  He had issues with hypotension.  He was started on Midodrine with improvement of symptoms.  He developed Atrial Fibrillation and converted to NSR with use of Amiodarone.  He remains bradycardic.  His pacing wires have been removed without difficulty.  He was diuresed for mild hypervolemia.  He is ambulating independently.  His incision is healing without evidence of infection.  He is medically stable for discharge home today.  Significant Diagnostic Studies:   LEFT HEART CATH AND CORONARY ANGIOGRAPHY  Conclusion   Conclusions: 1. Severe 3-vessel coronary artery disease, as detailed below, including 60% distal LMCA, 70-80% proximal LAD, and 80-90% ostial LCx stenoses, as well as chronic total occlusion of mid RCA with left-to-right collaterals. 2. Normal left ventricular contraction and filling pressure.  Recommendations: 1. Outpatient cardiac surgery consultation for CABG. 2. Aggressive secondary prevention. 3. Aspirin 81  mg daily and sublingual NTG as needed for chest pain.  [name], MD [facility] Pager: [phone]   Recommendations   Antiplatelet/Anticoag Recommend Aspirin 81mg  daily for moderate CAD.  Surgeon Notes     07/16/2018 1:26 PM Op Note signed by [name], MD  Indications   CAD in native artery [I25.10 (ICD-10-CM)]  Abnormal stress test [R94.39 (ICD-10-CM)]  Procedural Details   Technical Details Indication: 78 y.o. year-old man with history of hyperlipidemia, GERD, and significant coronary artery calcification incidentally noted on abdominal CT, referred for evaluation of abnormal, high-risk exercise tolerance test. Though he previously denies chest pain and shortness of breath, he has noted some vague chest tightness when exercising over the last week or two.  GFR: 84 ml/min  Procedure: The risks, benefits, complications, treatment options, and expected outcomes were discussed with the patient. The patient and/or family concurred with the proposed plan, giving informed consent. The patient was brought to the cath lab after IV hydration was begun and oral premedication was given. The patient was further sedated with Versed and Fentanyl. The right wrist was assessed with a modified Allens test which was normal. The right wrist was prepped and draped in a sterile fashion. 1% lidocaine was used for local anesthesia. Using the modified Seldinger access technique, a 51F slender Glidesheath was placed in the right radial artery. 3 mg Verapamil was given through the sheath. Heparin 3,500 units were administered.  Selective coronary angiography was performed using 27F JL3.5 and JR4 catheters to engage the left and right coronary arteries, respectively. Left heart catheterization was performed using a 27F JR4 catheter. Left ventriculogram was performed with a hand injection of contrast.  At the end of the procedure, the radial artery sheath was removed and a TR band applied to achieve patent hemostasis. There were no immediate complications. The patient was taken  to the recovery area in stable condition.  Contrast used: 65 mL Isovue Fluoroscopy time: 6.9 min Radiation dose: 253 mGy  Estimated blood loss <50 mL.   During this procedure medications were administered to achieve and maintain moderate conscious sedation while the patient's heart rate, blood pressure, and oxygen saturation were continuously monitored and I was present face-to-face 100% of this time.  Medications  (Filter: Administrations occurring from 07/06/18 0827 to 07/06/18 0933)  Medication Rate/Dose/Volume Action  Date Time   midazolam (VERSED) injection (mg) 0.5 mg Given 07/06/18 0849   Total dose as of 07/17/18 1057        0.5 mg        fentaNYL (SUBLIMAZE) injection (mcg) 25 mcg Given 07/06/18 0849   Total dose as of 07/17/18 1057        25 mcg        lidocaine (PF) (XYLOCAINE) 1 % injection (mL) 2 mL Given 07/06/18 0849   Total dose as of 07/17/18 1057        2 mL        Heparin (Porcine) in NaCl 1000-0.9 UT/500ML-% SOLN (mL) 500 mL Given 07/06/18 0849   Total dose as of 07/17/18 1057 500 mL Given 0850   1,000 mL        Radial Cocktail/Verapamil only (mL) 10 mL Given 07/06/18 0851   Total dose as of 07/17/18 1057        10 mL        heparin injection (Units) 3,500 Units Given 07/06/18 0852   Total dose as of 07/17/18 1057  3,500 Units        Sedation Time   Sedation Time Physician-1: 22 minutes 36 seconds  Complications   Complications documented before study signed (07/06/2018 9:59 AM EST)    No complications were associated with this study.  Documented by [name], MD - 07/06/2018 9:57 AM EST    Coronary Findings   Diagnostic  Dominance: Right  Left Main  Vessel is large.  Ost LM lesion 30% stenosed  Ost LM lesion is 30% stenosed.  Dist LM lesion 60% stenosed  Dist LM lesion is 60% stenosed.  Left Anterior Descending  Vessel is large.  Prox LAD lesion 75% stenosed  Prox LAD lesion is 75% stenosed. The lesion is eccentric. The lesion is  calcified.  First Diagonal Branch  Vessel is moderate in size.  Second Diagonal Branch  Vessel is small in size.  Left Circumflex  Vessel is large.  Ost Cx lesion 85% stenosed  Ost Cx lesion is 85% stenosed.  First Obtuse Marginal Branch  Vessel is large in size.  Second Obtuse Marginal Branch  Vessel is small in size.  Right Coronary Artery  Vessel is moderate in size. The vessel is severely calcified.  Prox RCA lesion 90% stenosed  Prox RCA lesion is 90% stenosed.  Mid RCA lesion 100% stenosed  Mid RCA lesion is 100% stenosed. The lesion is chronically occluded with left-to-right collateral flow.  Right Posterior Descending Artery  Collaterals  RPDA filled by collaterals from Dist LAD.    Right Posterior Atrioventricular Branch  Collaterals  Post Atrio filled by collaterals from Dist Cx.    Intervention   No interventions have been documented.  Wall Motion   Resting               Left Heart   Left Ventricle The left ventricular size is normal. The left ventricular systolic function is normal. LV end diastolic pressure is normal. The left ventricular ejection fraction is 55-65% by visual estimate. No regional wall motion abnormalities.  Aortic Valve There is no aortic valve stenosis.  Coronary Diagrams   Diagnostic  Dominance: Right        Treatments:   CARDIOVASCULAR SURGERY OPERATIVE NOTE  07/16/2018  Surgeon:  [name], MD  First Assistant: Enid Cutter,  PA-C   Preoperative Diagnosis:  Severe multi-vessel coronary artery disease   Postoperative Diagnosis:  Same   Procedure:  1. Median Sternotomy 2. Extracorporeal circulation 3.   Coronary artery bypass grafting x 3   Left internal mammary graft to the LAD  SVG to OM  SVG to RCA  4.   Endoscopic vein harvest from the left leg   Anesthesia:  General Endotracheal   Clinical History/Surgical Indication:  This 78 year old gentleman has 60% distal left  main and severe three-vessel coronary disease with preserved left ventricular systolic function and a markedly abnormal exercise treadmill test. I suspect that he probably is having some mild anginal symptoms the longer I talked to him. He did have a episode of dizziness after bending over earlier today that I suspect this was probably a vagal episode made worse by a sublingual nitroglycerin. I agree that coronary bypass graft surgery is the best treatment to prevent further ischemia and infarction and improve his quality of life. He has had a previous right greater saphenous vein stripping.I reviewed the cardiac catheterization images with the patient and his wife and answered all their questions. I discussed the operative procedure with the patient andhis wifeincluding alternatives, benefits and  risks; including but not limited to bleeding, blood transfusion, infection, stroke, myocardial infarction, graft failure, heart block requiring a permanent pacemaker, organ dysfunction, and death. Johnell Comings and agrees to proceed.   Discharge Exam: Blood pressure 114/66, pulse (!) 59, temperature 98.2 F (36.8 C), temperature source Oral, resp. rate 16, height 5\' 10"  (1.778 m), weight 72.3 kg, SpO2 97 %.  General appearance: alert, cooperative and no distress Heart: regular rate and rhythm Lungs: clear to auscultation bilaterally Abdomen: soft, non-tender; bowel sounds normal; no masses,  no organomegaly Extremities: edema trace Wound: clean and dry   Discharge disposition: 01-Home or Self Care  Discharge Medications:    Allergies as of 07/24/2018      Reactions   Sulfa Antibiotics Other (See Comments)   UNKNOWN CHILDHOOD REACTION      Medication List    TAKE these medications   acetaminophen 650 MG CR tablet Commonly known as:  TYLENOL Take 1,300 mg by mouth daily.   amiodarone 200 MG tablet Commonly known as:  PACERONE Take 1 tablet (200 mg total) by mouth 2  (two) times daily. X 7 days, then decrease to 200 mg daily   aspirin EC 81 MG tablet Take 1 tablet (81 mg total) by mouth daily. What changed:  when to take this   carbamide peroxide 6.5 % OTIC solution Commonly known as:  DEBROX Place 5 drops into the right ear daily as needed (for earwax build up).   [employer] ACID CONTROLLER MAX ST 20 MG tablet Generic drug:  famotidine Take 20 mg by mouth daily.   METAMUCIL PO Take 2-4 capsules by mouth daily.   midodrine 10 MG tablet Commonly known as:  PROAMATINE Take 1 tablet (10 mg total) by mouth 3 (three) times daily with meals.   multivitamin with minerals Tabs tablet Take 1 tablet by mouth daily.   nitroGLYCERIN 0.4 MG SL tablet Commonly known as:  NITROSTAT Place 1 tablet (0.4 mg total) under the tongue every 5 (five) minutes as needed for chest pain.   rosuvastatin 20 MG tablet Commonly known as:  CRESTOR Take 1 tablet (20 mg total) by mouth daily. What changed:  when to take this   tamsulosin 0.4 MG Caps capsule Commonly known as:  FLOMAX Take 0.4 mg by mouth daily after supper. 30 minutes after supper.   timolol 0.5 % ophthalmic solution Commonly known as:  TIMOPTIC Place 1 drop into both eyes 2 (two) times daily.   traMADol 50 MG tablet Commonly known as:  ULTRAM Take 1 tablet (50 mg total) by mouth every 4 (four) hours as needed for moderate pain.   vitamin C with rose hips 1000 MG tablet Take 1,000 mg by mouth daily.     The patient has been discharged on:   1.Beta Blocker:  Yes [   ]                              No   [ x  ]                              If No, reason: bradycardia 2.Ace Inhibitor/ARB: Yes [   ]                                     No  [  x  ]                                     If No, reason:orthostatic hypotension  3.Statin:   Yes [ x  ]                  No  [   ]                  If No, reason:  4.Ecasa:  Yes  [x   ]                  No   [   ]                  If No,  reason:   [place] Follow up.   Why:  rollator and bedside commode Contact information: [address] [place] [address] [phone]        [facility] Follow up.   Specialty:  Home Health Services Why:  Physical Therapy Contact information: [place] [address] [phone]        [name], MD Follow up on 08/19/2018.   Specialty:  Cardiothoracic Surgery Why:  You have an appointment with Dr. Cyndia Bent on Wednesday 08/19/18 at 10:30am.  Please arrive 30 minutes early for chest Xray to be done on the first floor of the same building. Contact information: [address] [place] [address] [phone]        [facility]-Cardiac  Follow up on 07/29/2018.   Specialty:  Cardiothoracic Surgery Why:  You have an appointment for suture removal on wednesday 07/29/18 at [facility] (Dr. Vivi Martens office) on  Wednesday 07/29/18. Contact information: [place], [place] [phone]       Almyra Deforest, [place] Follow up on 08/04/2018.   Specialties:  Cardiology, Radiology Why:  Cardiology follow up appointment Wednesday 08/04/18 at 3pm. Contact information: [address] [place] [address] [phone]           Signed:  Original Note By: Enid Cutter PA-C  Update by:  Ellwood Handler PA-C 07/24/2018, 7:38 AM

## 2018-07-17 NOTE — Addendum Note (Signed)
Addendum  created 07/17/18 0830 by Josephine Igo, CRNA   Intraprocedure Meds edited

## 2018-07-17 NOTE — Progress Notes (Signed)
1 Day Post-Op Procedure(s) (LRB): CORONARY ARTERY BYPASS GRAFTING (CABG), ON PUMP, TIMES THREE, USING LEFT INTERNAL MAMMARY ARTERY AND LEFT GREATER SAHENOUS VEIN HARVESTED ENDOSCOPICALLY (N/A) TRANSESOPHAGEAL ECHOCARDIOGRAM (TEE) (N/A) Subjective: No complaints  Objective: Vital signs in last 24 hours: Temp:  [95 F (35 C)-100.4 F (38 C)] 99.9 F (37.7 C) (02/14 0700) Pulse Rate:  [73-89] 73 (02/14 0700) Cardiac Rhythm: Normal sinus rhythm (02/14 0400) Resp:  [9-25] 25 (02/14 0700) BP: (85-108)/(53-79) 85/69 (02/14 0700) SpO2:  [99 %-100 %] 99 % (02/14 0700) Arterial Line BP: (91-291)/(49-223) 102/53 (02/14 0600) FiO2 (%):  [40 %-50 %] 40 % (02/13 1740) Weight:  [72.4 kg] 72.4 kg (02/14 0500)  Hemodynamic parameters for last 24 hours: PAP: (11-28)/(4-16) 20/5 CO:  [3 L/min-6.1 L/min] 4.9 L/min CI:  [1.7 L/min/m2-3.3 L/min/m2] 2.6 L/min/m2  Intake/Output from previous day: 02/13 0701 - 02/14 0700 In: 7071.8 [I.V.:5185.5; Blood:600; IV Piggyback:1286.3] Out: 4450 [Urine:3110; Blood:450; Chest Tube:890] Intake/Output this shift: No intake/output data recorded.  General appearance: alert and cooperative Neurologic: intact Heart: regular rate and rhythm, S1, S2 normal, no murmur, click, rub or gallop Lungs: clear to auscultation bilaterally Extremities: edema mild Wound: dressings dry  Lab Results: Recent Labs    07/16/18 1840 07/16/18 1906 07/17/18 0255  WBC 8.1  --  9.0  HGB 9.2* 7.8* 8.3*  HCT 26.7* 23.0* 25.3*  PLT 133*  --  160   BMET:  Recent Labs    07/16/18 0332  07/16/18 1137  07/16/18 1840 07/16/18 1906 07/17/18 0255  NA 140   < > 138   < >  --  139 137  K 3.9   < > 4.8   < >  --  4.7 4.2  CL 104  --   --   --   --   --  107  CO2 26  --   --   --   --   --  22  GLUCOSE 99   < > 100*  --   --   --  109*  BUN 10  --   --   --   --   --  7*  CREATININE 0.88  --   --   --  0.77  --  0.74  CALCIUM 9.2  --   --   --   --   --  8.1*   < > = values in  this interval not displayed.    PT/INR:  Recent Labs    07/16/18 1247  LABPROT 17.0*  INR 1.40   ABG    Component Value Date/Time   PHART 7.335 (L) 07/16/2018 1906   HCO3 22.7 07/16/2018 1906   TCO2 24 07/16/2018 1906   ACIDBASEDEF 3.0 (H) 07/16/2018 1906   O2SAT 98.0 07/16/2018 1906   CBG (last 3)  Recent Labs    07/16/18 1553 07/16/18 2000 07/17/18 0022  GLUCAP 103* 106* 118*   CXR: ok  ECG: sinus, mild diffuse ST elevation consistent with pericarditis.  Assessment/Plan: S/P Procedure(s) (LRB): CORONARY ARTERY BYPASS GRAFTING (CABG), ON PUMP, TIMES THREE, USING LEFT INTERNAL MAMMARY ARTERY AND LEFT GREATER SAHENOUS VEIN HARVESTED ENDOSCOPICALLY (N/A) TRANSESOPHAGEAL ECHOCARDIOGRAM (TEE) (N/A)  POD 1 Hemodynamically stable in sinus rhythm. Still on 60 mcg neo. Wean as tolerated. No beta blocker since he was having marked sinus brady preop with syncopal episodes. I am not sure if this was ischemia mediated. Will continue observation of rhythm on telemetry and if he continues to have marked sinus brady at  times he may need PPM.  DC chest tubes, swan  IS, OOB.  Glucose under good control with no hx of DM so will DC CBG's.  Expected acute postop blood loss anemia: will start iron and observe.   LOS: 3 days    [name] 07/17/2018

## 2018-07-17 NOTE — Plan of Care (Signed)
Pt currently in chair. No complaints of pain. Neo infusing, trying to wean pt off. Per MD order, aline can come out after neo has been wean and BP stays stable. CT and swan out this shift. Vital signs currently stable. No new changes or complaints at this time. Will continue to monitor.  Problem: Education: Goal: Knowledge of General Education information will improve Description Including pain rating scale, medication(s)/side effects and non-pharmacologic comfort measures Outcome: Progressing   Problem: Health Behavior/Discharge Planning: Goal: Ability to manage health-related needs will improve Outcome: Progressing   Problem: Clinical Measurements: Goal: Ability to maintain clinical measurements within normal limits will improve Outcome: Progressing Goal: Will remain free from infection Outcome: Progressing Goal: Diagnostic test results will improve Outcome: Progressing Goal: Respiratory complications will improve Outcome: Progressing Goal: Cardiovascular complication will be avoided Outcome: Progressing   Problem: Activity: Goal: Risk for activity intolerance will decrease Outcome: Progressing   Problem: Nutrition: Goal: Adequate nutrition will be maintained Outcome: Progressing   Problem: Coping: Goal: Level of anxiety will decrease Outcome: Progressing   Problem: Elimination: Goal: Will not experience complications related to bowel motility Outcome: Progressing Goal: Will not experience complications related to urinary retention Outcome: Progressing   Problem: Pain Managment: Goal: General experience of comfort will improve Outcome: Progressing   Problem: Safety: Goal: Ability to remain free from injury will improve Outcome: Progressing   Problem: Skin Integrity: Goal: Risk for impaired skin integrity will decrease Outcome: Progressing   Problem: Education: Goal: Will demonstrate proper wound care and an understanding of methods to prevent future  damage Outcome: Progressing Goal: Knowledge of disease or condition will improve Outcome: Progressing Goal: Knowledge of the prescribed therapeutic regimen will improve Outcome: Progressing Goal: Individualized Educational Video(s) Outcome: Progressing   Problem: Activity: Goal: Risk for activity intolerance will decrease Outcome: Progressing   Problem: Cardiac: Goal: Will achieve and/or maintain hemodynamic stability Outcome: Progressing   Problem: Clinical Measurements: Goal: Postoperative complications will be avoided or minimized Outcome: Progressing   Problem: Respiratory: Goal: Respiratory status will improve Outcome: Progressing   Problem: Skin Integrity: Goal: Wound healing without signs and symptoms of infection Outcome: Progressing Goal: Risk for impaired skin integrity will decrease Outcome: Progressing   Problem: Urinary Elimination: Goal: Ability to achieve and maintain adequate renal perfusion and functioning will improve Outcome: Progressing

## 2018-07-17 NOTE — Progress Notes (Addendum)
CT surgery p.m. Rounds  Patient resting comfortably, breathing 2 L nasal cannula Remains on postop Neo-Synephrine for blood pressure Sinus rhythm with PACs A.m. labs reviewed and are satisfactory Continue current care

## 2018-07-17 NOTE — Discharge Instructions (Signed)

## 2018-07-18 ENCOUNTER — Inpatient Hospital Stay (HOSPITAL_COMMUNITY): Payer: Medicare Other

## 2018-07-18 LAB — CBC
HCT: 24.1 % — ABNORMAL LOW (ref 39.0–52.0)
Hemoglobin: 8 g/dL — ABNORMAL LOW (ref 13.0–17.0)
MCH: 33.8 pg (ref 26.0–34.0)
MCHC: 33.2 g/dL (ref 30.0–36.0)
MCV: 101.7 fL — ABNORMAL HIGH (ref 80.0–100.0)
Platelets: 145 10*3/uL — ABNORMAL LOW (ref 150–400)
RBC: 2.37 MIL/uL — AB (ref 4.22–5.81)
RDW: 12.9 % (ref 11.5–15.5)
WBC: 12.1 10*3/uL — ABNORMAL HIGH (ref 4.0–10.5)
nRBC: 0 % (ref 0.0–0.2)

## 2018-07-18 LAB — BASIC METABOLIC PANEL
Anion gap: 7 (ref 5–15)
BUN: 12 mg/dL (ref 8–23)
CO2: 24 mmol/L (ref 22–32)
Calcium: 8.3 mg/dL — ABNORMAL LOW (ref 8.9–10.3)
Chloride: 107 mmol/L (ref 98–111)
Creatinine, Ser: 0.78 mg/dL (ref 0.61–1.24)
GFR calc Af Amer: >60 mL/min (ref >60)
GFR calc non Af Amer: >60 mL/min (ref >60)
Glucose, Bld: 119 mg/dL — ABNORMAL HIGH (ref 70–99)
Potassium: 3.8 mmol/L (ref 3.5–5.1)
Sodium: 138 mmol/L (ref 135–145)

## 2018-07-18 MED ORDER — SODIUM CHLORIDE 0.9 % IV SOLN
INTRAVENOUS | Status: DC | PRN
  Administered 2018-07-18: 500 mL via INTRAVENOUS

## 2018-07-18 MED ORDER — FE FUMARATE-B12-VIT C-FA-IFC PO CAPS
1.0000 | ORAL_CAPSULE | Freq: Three times a day (TID) | ORAL | Status: DC
  Administered 2018-07-18 – 2018-07-24 (×19): 1 via ORAL
  Filled 2018-07-18 (×20): qty 1

## 2018-07-18 MED ORDER — AMIODARONE HCL 200 MG PO TABS
200.0000 mg | ORAL_TABLET | Freq: Two times a day (BID) | ORAL | Status: DC
  Administered 2018-07-18 – 2018-07-19 (×4): 200 mg via ORAL
  Filled 2018-07-18 (×4): qty 1

## 2018-07-18 MED ORDER — AMIODARONE IV BOLUS ONLY 150 MG/100ML
150.0000 mg | Freq: Once | INTRAVENOUS | Status: AC
  Administered 2018-07-18: 150 mg via INTRAVENOUS
  Filled 2018-07-18: qty 100

## 2018-07-18 MED ORDER — ALBUMIN HUMAN 5 % IV SOLN
12.5000 g | Freq: Once | INTRAVENOUS | Status: AC
  Administered 2018-07-18: 12.5 g via INTRAVENOUS
  Filled 2018-07-18: qty 250

## 2018-07-18 MED ORDER — AMIODARONE HCL IN DEXTROSE 360-4.14 MG/200ML-% IV SOLN: 30.0000 mg/h | INTRAVENOUS | Status: DC

## 2018-07-18 MED ORDER — AMIODARONE HCL IN DEXTROSE 360-4.14 MG/200ML-% IV SOLN
60.0000 mg/h | INTRAVENOUS | Status: DC
  Administered 2018-07-18 (×2): 60 mg/h via INTRAVENOUS
  Filled 2018-07-18 (×2): qty 200

## 2018-07-18 MED ORDER — MIDODRINE HCL 5 MG PO TABS
10.0000 mg | ORAL_TABLET | Freq: Three times a day (TID) | ORAL | Status: DC
  Administered 2018-07-18 – 2018-07-24 (×19): 10 mg via ORAL
  Filled 2018-07-18 (×20): qty 2

## 2018-07-18 MED ORDER — AMIODARONE HCL IN DEXTROSE 360-4.14 MG/200ML-% IV SOLN
30.0000 mg/h | INTRAVENOUS | Status: DC
  Administered 2018-07-18 – 2018-07-19 (×2): 30 mg/h via INTRAVENOUS
  Filled 2018-07-18 (×2): qty 200

## 2018-07-18 NOTE — Progress Notes (Addendum)
CT Surgery PM Rounds  Remains in and out of afib Will re-bolus amiodarone and cont drip Keep central line until am Still req neo at 30 for BP

## 2018-07-18 NOTE — Progress Notes (Signed)
Patient converted to A Fib with HR 120-150's. Pt was symptomatic with hypotension. Paged on call T[facility] surgeon, obtained verbal orders to start amiodarone infusion with no bolus and give 250 mL 5% albumin. (See new orders).

## 2018-07-18 NOTE — Progress Notes (Signed)
2 Days Post-Op Procedure(s) (LRB): CORONARY ARTERY BYPASS GRAFTING (CABG), ON PUMP, TIMES THREE, USING LEFT INTERNAL MAMMARY ARTERY AND LEFT GREATER SAHENOUS VEIN HARVESTED ENDOSCOPICALLY (N/A) TRANSESOPHAGEAL ECHOCARDIOGRAM (TEE) (N/A) Subjective: Converted to sinus rhythm on IV amiodarone Had problems with orthostatic dizziness when he tried to ambulate this a.m. Portable chest x-ray clear Expected postoperative blood loss anemia on oral iron supplement Neo-Synephrine is weaning-will DC A-line, start p.o. Middaugh drain  Objective: Vital signs in last 24 hours: Temp:  [98.6 F (37 C)-99.2 F (37.3 C)] 98.8 F (37.1 C) (02/15 1155) Pulse Rate:  [59-82] 59 (02/15 1130) Cardiac Rhythm: Atrial paced (02/15 0800) Resp:  [14-27] 19 (02/15 1130) BP: (88-141)/(62-89) 101/73 (02/15 1130) SpO2:  [91 %-100 %] 100 % (02/15 1130) Arterial Line BP: (84-145)/(44-71) 96/50 (02/15 1130) Weight:  [73.6 kg] 73.6 kg (02/15 0600)  Hemodynamic parameters for last 24 hours:    Intake/Output from previous day: 02/14 0701 - 02/15 0700 In: 2315.4 [P.O.:240; I.V.:1625.4; IV Piggyback:450] Out: 1140 [Urine:1040; Chest Tube:100] Intake/Output this shift: Total I/O In: 768.6 [P.O.:480; I.V.:288.6] Out: 135 [Urine:135]  Exam Alert and comfortable Lungs clear Sternal dressing dry Heart rate atrial paced at 88/min Abdomen soft  Lab Results: Recent Labs    07/17/18 1700 07/18/18 0426  WBC 12.8* 12.1*  HGB 9.3* 8.0*  HCT 27.3* 24.1*  PLT 185 145*   BMET:  Recent Labs    07/17/18 1700 07/18/18 0426  NA 137 138  K 4.2 3.8  CL 107 107  CO2 22 24  GLUCOSE 156* 119*  BUN 12 12  CREATININE 0.78 0.78  CALCIUM 8.7* 8.3*    PT/INR:  Recent Labs    07/16/18 1247  LABPROT 17.0*  INR 1.40   ABG    Component Value Date/Time   PHART 7.335 (L) 07/16/2018 1906   HCO3 22.7 07/16/2018 1906   TCO2 24 07/16/2018 1906   ACIDBASEDEF 3.0 (H) 07/16/2018 1906   O2SAT 98.0 07/16/2018 1906    CBG (last 3)  Recent Labs    07/17/18 0830 07/17/18 0831 07/17/18 0832  GLUCAP <10* 59* 90    Assessment/Plan: S/P Procedure(s) (LRB): CORONARY ARTERY BYPASS GRAFTING (CABG), ON PUMP, TIMES THREE, USING LEFT INTERNAL MAMMARY ARTERY AND LEFT GREATER SAHENOUS VEIN HARVESTED ENDOSCOPICALLY (N/A) TRANSESOPHAGEAL ECHOCARDIOGRAM (TEE) (N/A) Mobilize Diuresis Wean neo-as tolerated blood pressure greater than 881 systolic.start short-term oral midodrine Transition to oral amiodarone to avoid IV phlebitis Keep in ICU today  LOS: 4 days    Tharon Aquas Trigt III 07/18/2018

## 2018-07-18 NOTE — Progress Notes (Signed)
Celenia, RN aware of order to discontinue central line.

## 2018-07-19 ENCOUNTER — Inpatient Hospital Stay (HOSPITAL_COMMUNITY): Payer: Medicare Other

## 2018-07-19 LAB — BPAM RBC
BLOOD PRODUCT EXPIRATION DATE: 202003092359
Blood Product Expiration Date: 202003062359
Blood Product Expiration Date: 202003082359
Blood Product Expiration Date: 202003092359
ISSUE DATE / TIME: 202002151909
ISSUE DATE / TIME: 202002161007
ISSUE DATE / TIME: 202002151909
UNIT TYPE AND RH: 6200
UNIT TYPE AND RH: 6200
Unit Type and Rh: 6200
Unit Type and Rh: 6200

## 2018-07-19 LAB — CBC
HCT: 25.4 % — ABNORMAL LOW (ref 39.0–52.0)
Hemoglobin: 8.3 g/dL — ABNORMAL LOW (ref 13.0–17.0)
MCH: 32.9 pg (ref 26.0–34.0)
MCHC: 32.7 g/dL (ref 30.0–36.0)
MCV: 100.8 fL — ABNORMAL HIGH (ref 80.0–100.0)
Platelets: 171 10*3/uL (ref 150–400)
RBC: 2.52 MIL/uL — ABNORMAL LOW (ref 4.22–5.81)
RDW: 13.1 % (ref 11.5–15.5)
WBC: 13.3 10*3/uL — ABNORMAL HIGH (ref 4.0–10.5)
nRBC: 0 % (ref 0.0–0.2)

## 2018-07-19 LAB — TYPE AND SCREEN
ABO/RH(D): A POS
Antibody Screen: NEGATIVE
Unit division: 0
Unit division: 0
Unit division: 0
Unit division: 0

## 2018-07-19 LAB — BASIC METABOLIC PANEL
Anion gap: 6 (ref 5–15)
BUN: 14 mg/dL (ref 8–23)
CO2: 25 mmol/L (ref 22–32)
Calcium: 8.1 mg/dL — ABNORMAL LOW (ref 8.9–10.3)
Chloride: 107 mmol/L (ref 98–111)
Creatinine, Ser: 0.75 mg/dL (ref 0.61–1.24)
GFR calc Af Amer: >60 mL/min (ref >60)
GFR calc non Af Amer: >60 mL/min (ref >60)
Glucose, Bld: 124 mg/dL — ABNORMAL HIGH (ref 70–99)
Potassium: 3.6 mmol/L (ref 3.5–5.1)
Sodium: 138 mmol/L (ref 135–145)

## 2018-07-19 MED ORDER — OXYCODONE HCL 5 MG PO TABS: 5.0000 mg | ORAL_TABLET | Freq: Four times a day (QID) | ORAL | Status: DC | PRN

## 2018-07-19 NOTE — Progress Notes (Signed)
CT surgery p.m. Rounds  Remains atrially paced and slow sinus rhythm Remains off Neo-Synephrine with blood pressure 95 systolic Good appetite and oral intake Needs assistance x2 for getting up out of bed to chair
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
## 2018-07-19 NOTE — Plan of Care (Signed)
  Problem: Education: Goal: Knowledge of General Education information will improve Description Including pain rating scale, medication(s)/side effects and non-pharmacologic comfort measures Outcome: Progressing   Problem: Clinical Measurements: Goal: Will remain free from infection Outcome: Progressing Goal: Diagnostic test results will improve Outcome: Progressing Goal: Respiratory complications will improve Outcome: Progressing   Problem: Nutrition: Goal: Adequate nutrition will be maintained Outcome: Progressing   Problem: Coping: Goal: Level of anxiety will decrease Outcome: Progressing   Problem: Elimination: Goal: Will not experience complications related to bowel motility Outcome: Progressing Goal: Will not experience complications related to urinary retention Outcome: Progressing   Problem: Pain Managment: Goal: General experience of comfort will improve Outcome: Progressing   Problem: Safety: Goal: Ability to remain free from injury will improve Outcome: Progressing   Problem: Skin Integrity: Goal: Risk for impaired skin integrity will decrease Outcome: Progressing   Problem: Education: Goal: Knowledge of disease or condition will improve Outcome: Progressing   Problem: Skin Integrity: Goal: Wound healing without signs and symptoms of infection Outcome: Progressing Goal: Risk for impaired skin integrity will decrease Outcome: Progressing

## 2018-07-20 LAB — BASIC METABOLIC PANEL
Anion gap: 5 (ref 5–15)
BUN: 18 mg/dL (ref 8–23)
CO2: 27 mmol/L (ref 22–32)
Calcium: 8.3 mg/dL — ABNORMAL LOW (ref 8.9–10.3)
Chloride: 107 mmol/L (ref 98–111)
Creatinine, Ser: 0.8 mg/dL (ref 0.61–1.24)
GFR calc Af Amer: >60 mL/min (ref >60)
GFR calc non Af Amer: >60 mL/min (ref >60)
Glucose, Bld: 117 mg/dL — ABNORMAL HIGH (ref 70–99)
Potassium: 3.9 mmol/L (ref 3.5–5.1)
Sodium: 139 mmol/L (ref 135–145)

## 2018-07-20 LAB — CBC
HCT: 25.3 % — ABNORMAL LOW (ref 39.0–52.0)
Hemoglobin: 8.3 g/dL — ABNORMAL LOW (ref 13.0–17.0)
MCH: 32.8 pg (ref 26.0–34.0)
MCHC: 32.8 g/dL (ref 30.0–36.0)
MCV: 100 fL (ref 80.0–100.0)
Platelets: 193 10*3/uL (ref 150–400)
RBC: 2.53 MIL/uL — ABNORMAL LOW (ref 4.22–5.81)
RDW: 13.2 % (ref 11.5–15.5)
WBC: 10.4 10*3/uL (ref 4.0–10.5)
nRBC: 0 % (ref 0.0–0.2)

## 2018-07-20 MED ORDER — ENOXAPARIN SODIUM 30 MG/0.3ML ~~LOC~~ SOLN
30.0000 mg | SUBCUTANEOUS | Status: DC
  Administered 2018-07-20 – 2018-07-23 (×4): 30 mg via SUBCUTANEOUS
  Filled 2018-07-20 (×5): qty 0.3

## 2018-07-20 MED ORDER — PHENYLEPHRINE HCL-NACL 20-0.9 MG/250ML-% IV SOLN: 0.0000 ug/min | INTRAVENOUS | Status: DC

## 2018-07-20 MED ORDER — AMIODARONE HCL 200 MG PO TABS
200.0000 mg | ORAL_TABLET | Freq: Every day | ORAL | Status: DC
  Administered 2018-07-20 – 2018-07-22 (×3): 200 mg via ORAL
  Filled 2018-07-20 (×3): qty 1

## 2018-07-20 NOTE — Evaluation (Signed)
Physical Therapy Evaluation Patient Details Name: Brian Terry MRN: [id number] DOB: April 15, 1941 Today's Date: 07/20/2018   History of Present Illness  Pt adm after having syncopal episode performing PFT prior to scheduled CABG on 2/13. Pt had had frequent syncopal episodes with bradycardica and hypotension in the week prior to admission. Pt underwent scheduled CABG x 4 on 2/13. Post op pt with hypotension pre-syncope. PMH - GERD  Clinical Impression  Pt presented to PT with decr mobility s/p CABG but no signs of presyncope/syncope. BP in sitting 87/60. BP after amb 118/67. Expect pt will make good progress and be able to return home with supportive wife as long as no further syncopal symptoms.     Follow Up Recommendations Home health PT;Supervision for mobility/OOB    Equipment Recommendations  Other (comment)(rollator)    Recommendations for Other Services       Precautions / Restrictions Precautions Precautions: Fall;Other (comment)(syncope, orthostasis)      Mobility  Bed Mobility               General bed mobility comments: Pt up on bsc  Transfers Overall transfer level: Needs assistance Equipment used: 4-wheeled walker Transfers: Sit to/from Stand Sit to Stand: +2 safety/equipment;Mod assist         General transfer comment: Verbal cues for hand placement on knees. Assist to bring hips up.  Ambulation/Gait Ambulation/Gait assistance: Min assist;+2 safety/equipment(close chair follow) Gait Distance (Feet): 375 Feet Assistive device: 4-wheeled walker Gait Pattern/deviations: Step-through pattern;Decreased stride length Gait velocity: decr Gait velocity interpretation: 1.31 - 2.62 ft/sec, indicative of limited community ambulator General Gait Details: Assist for stability and for safety. Pt with recent history of syncope and orthostatic hypotension so 2nd person followed closely with recliner. Pt denied any dizziness or light headedness with amb  Stairs             Wheelchair Mobility    Modified Rankin (Stroke Patients Only)       Balance Overall balance assessment: Mild deficits observed, not formally tested                                           Pertinent Vitals/Pain Pain Assessment: No/denies pain    Home Living Family/patient expects to be discharged to:: Private residence Living Arrangements: Spouse/significant other Available Help at Discharge: Family;Available 24 hours/day Type of Home: House Home Access: Stairs to enter Entrance Stairs-Rails: None Entrance Stairs-Number of Steps: 2 Home Layout: Two level;Laundry or work area in basement;Able to live on main level with [employer]: None      Prior Function Level of Independence: Independent         Comments: Very active. Goes to gym daily. Works in yard and plays Education officer, environmental        Extremity/Trunk Assessment   Upper Extremity Assessment Upper Extremity Assessment: Generalized weakness    Lower Extremity Assessment Lower Extremity Assessment: Generalized weakness       Communication   Communication: No difficulties  Cognition Arousal/Alertness: Awake/alert Behavior During Therapy: WFL for tasks assessed/performed Overall Cognitive Status: Within Functional Limits for tasks assessed                                        General Comments  Exercises     Assessment/Plan    PT Assessment Patient needs continued PT services  PT Problem List Decreased strength;Decreased activity tolerance;Decreased balance;Decreased mobility;Decreased knowledge of precautions       PT Treatment Interventions DME instruction;Gait training;Stair training;Functional mobility training;Therapeutic activities;Therapeutic exercise;Balance training;Patient/family education    PT Goals (Current goals can be found in the Care Plan section)  Acute Rehab PT Goals Patient Stated Goal: return to  golf PT Goal Formulation: With patient Time For Goal Achievement: 08/03/18 Potential to Achieve Goals: Good    Frequency Min 3X/week   Barriers to discharge Inaccessible home environment stairs to enter    Co-evaluation               AM-PAC PT "6 Clicks" Mobility  Outcome Measure Help needed turning from your back to your side while in a flat bed without using bedrails?: A Little Help needed moving from lying on your back to sitting on the side of a flat bed without using bedrails?: A Little Help needed moving to and from a bed to a chair (including a wheelchair)?: A Little Help needed standing up from a chair using your arms (e.g., wheelchair or bedside chair)?: A Lot Help needed to walk in hospital room?: A Little Help needed climbing 3-5 steps with a railing? : A Little 6 Click Score: 17    End of Session Equipment Utilized During Treatment: Gait belt Activity Tolerance: Patient tolerated treatment well Patient left: in chair;with call bell/phone within reach;with family/visitor present Nurse Communication: Mobility status PT Visit Diagnosis: Unsteadiness on feet (R26.81);Other abnormalities of gait and mobility (R26.89);Muscle weakness (generalized) (M62.81)    Time: 1324-4010 PT Time Calculation (min) (ACUTE ONLY): 20 min   Charges:   PT Evaluation $PT Eval Moderate Complexity: [place] Pager [phone] Office [place] 07/20/2018, 12:57 PM

## 2018-07-20 NOTE — Progress Notes (Signed)
Patient ID: Brian Terry, male   DOB: 1940-07-24, 78 y.o.   MRN: [id number] [facility] Evening Rounds:  Hemodynamically stable in sinus rhythm  Ambulated around the ICU.  No dizziness or syncope.  Plan to start diuresis in am.

## 2018-07-20 NOTE — Progress Notes (Signed)
4 Days Post-Op Procedure(s) (LRB): CORONARY ARTERY BYPASS GRAFTING (CABG), ON PUMP, TIMES THREE, USING LEFT INTERNAL MAMMARY ARTERY AND LEFT GREATER SAHENOUS VEIN HARVESTED ENDOSCOPICALLY (N/A) TRANSESOPHAGEAL ECHOCARDIOGRAM (TEE) (N/A) Subjective: Says he feels stronger today. Sitting up eating breakfast  Objective: Vital signs in last 24 hours: Temp:  [98.2 F (36.8 C)-98.7 F (37.1 C)] 98.2 F (36.8 C) (02/17 0344) Pulse Rate:  [78-82] 80 (02/17 0700) Cardiac Rhythm: Atrial paced (02/17 0400) Resp:  [12-34] 13 (02/17 0700) BP: (83-116)/(56-82) 83/62 (02/17 0700) SpO2:  [95 %-99 %] 95 % (02/17 0700) Weight:  [74.9 kg] 74.9 kg (02/17 0600)  Hemodynamic parameters for last 24 hours:    Intake/Output from previous day: 02/16 0701 - 02/17 0700 In: 750 [P.O.:510; I.V.:240] Out: 976 [Urine:975; Stool:1] Intake/Output this shift: No intake/output data recorded.  General appearance: alert and cooperative Neurologic: intact Heart: regular rate and rhythm, S1, S2 normal, no murmur, click, rub or gallop Lungs: diminished breath sounds base - left Extremities: edema mild Wound: incision ok  Lab Results: Recent Labs    07/19/18 0449 07/20/18 0258  WBC 13.3* 10.4  HGB 8.3* 8.3*  HCT 25.4* 25.3*  PLT 171 193   BMET:  Recent Labs    07/19/18 0449 07/20/18 0258  NA 138 139  K 3.6 3.9  CL 107 107  CO2 25 27  GLUCOSE 124* 117*  BUN 14 18  CREATININE 0.75 0.80  CALCIUM 8.1* 8.3*    PT/INR: No results for input(s): LABPROT, INR in the last 72 hours. ABG    Component Value Date/Time   PHART 7.335 (L) 07/16/2018 1906   HCO3 22.7 07/16/2018 1906   TCO2 24 07/16/2018 1906   ACIDBASEDEF 3.0 (H) 07/16/2018 1906   O2SAT 98.0 07/16/2018 1906   CBG (last 3)  Recent Labs    07/17/18 0830 07/17/18 0831 07/17/18 0832  GLUCAP <10* 59* 90    Assessment/Plan: S/P Procedure(s) (LRB): CORONARY ARTERY BYPASS GRAFTING (CABG), ON PUMP, TIMES THREE, USING LEFT INTERNAL  MAMMARY ARTERY AND LEFT GREATER SAHENOUS VEIN HARVESTED ENDOSCOPICALLY (N/A) TRANSESOPHAGEAL ECHOCARDIOGRAM (TEE) (N/A)  POD 4 Postop orthostatic hypotension and dizziness with preop syncope. It was not clear if bradycardia played a role in his syncope preop but he was noted to have a HR in the 40's at times. Started on midodrine Saturday.  Postop atrial fib converted with amio. His HR is 70 this am. Will decrease amio to 200 mg daily with a hx of bradycardia. No beta blocker.  Expected postop blood loss anemia: stable. On iron.  Postop volume excess: wt is 18 lbs over preop if accurate. I suspect it is less than that. Have not been able to diurese yet due to borderline BP. Hopefully this will improve with midodrine.  PT consulted for help with ambulation.     LOS: 6 days    [name] 07/20/2018

## 2018-07-21 MED ORDER — FUROSEMIDE 40 MG PO TABS
40.0000 mg | ORAL_TABLET | Freq: Every day | ORAL | Status: AC
  Administered 2018-07-21 – 2018-07-23 (×3): 40 mg via ORAL
  Filled 2018-07-21 (×4): qty 1

## 2018-07-21 MED ORDER — POTASSIUM CHLORIDE CRYS ER 20 MEQ PO TBCR
20.0000 meq | EXTENDED_RELEASE_TABLET | Freq: Every day | ORAL | Status: AC
  Administered 2018-07-21 – 2018-07-23 (×3): 20 meq via ORAL
  Filled 2018-07-21 (×3): qty 1

## 2018-07-21 MED FILL — Mannitol IV Soln 20%: INTRAVENOUS | Qty: 500 | Status: AC

## 2018-07-21 MED FILL — Heparin Sodium (Porcine) Inj 1000 Unit/ML: INTRAMUSCULAR | Qty: 10 | Status: AC

## 2018-07-21 MED FILL — Lidocaine HCl(Cardiac) IV PF Soln Pref Syr 100 MG/5ML (2%): INTRAVENOUS | Qty: 5 | Status: AC

## 2018-07-21 MED FILL — Potassium Chloride Inj 2 mEq/ML: INTRAVENOUS | Qty: 40 | Status: AC

## 2018-07-21 MED FILL — Sodium Chloride IV Soln 0.9%: INTRAVENOUS | Qty: 2000 | Status: AC

## 2018-07-21 MED FILL — Electrolyte-R (PH 7.4) Solution: INTRAVENOUS | Qty: 4000 | Status: AC

## 2018-07-21 MED FILL — Heparin Sodium (Porcine) Inj 1000 Unit/ML: INTRAMUSCULAR | Qty: 30 | Status: AC

## 2018-07-21 MED FILL — Magnesium Sulfate Inj 50%: INTRAMUSCULAR | Qty: 10 | Status: AC

## 2018-07-21 MED FILL — Sodium Bicarbonate IV Soln 8.4%: INTRAVENOUS | Qty: 50 | Status: AC

## 2018-07-21 NOTE — Progress Notes (Signed)
CARDIAC REHAB PHASE I   Offered to walk with pt, pt declining at this time. Pt states he has walked three times today and wants to wait a little before walking again. Encouraged  IS use. Will continue to follow.  Rufina Falco, RN BSN 07/21/2018 2:58 PM

## 2018-07-21 NOTE — Plan of Care (Signed)
  Problem: Education: Goal: Knowledge of General Education information will improve Description Including pain rating scale, medication(s)/side effects and non-pharmacologic comfort measures Outcome: Progressing   Problem: Health Behavior/Discharge Planning: Goal: Ability to manage health-related needs will improve Outcome: Progressing   Problem: Clinical Measurements: Goal: Will remain free from infection Outcome: Progressing   Problem: Activity: Goal: Risk for activity intolerance will decrease Outcome: Progressing   Problem: Nutrition: Goal: Adequate nutrition will be maintained Outcome: Progressing   Problem: Elimination: Goal: Will not experience complications related to bowel motility Outcome: Progressing Goal: Will not experience complications related to urinary retention Outcome: Progressing   Problem: Pain Managment: Goal: General experience of comfort will improve Outcome: Progressing   Problem: Safety: Goal: Ability to remain free from injury will improve Outcome: Progressing   Problem: Skin Integrity: Goal: Risk for impaired skin integrity will decrease Outcome: Progressing   Problem: Education: Goal: Will demonstrate proper wound care and an understanding of methods to prevent future damage Outcome: Progressing   Problem: Activity: Goal: Risk for activity intolerance will decrease Outcome: Progressing   Problem: Skin Integrity: Goal: Wound healing without signs and symptoms of infection Outcome: Progressing Goal: Risk for impaired skin integrity will decrease Outcome: Progressing   Problem: Urinary Elimination: Goal: Ability to achieve and maintain adequate renal perfusion and functioning will improve Outcome: Progressing

## 2018-07-21 NOTE — Care Management Note (Addendum)
Case Management Note  Patient Details  Name: Brian Terry MRN: [id number] Date of Birth: [birth year]  Subjective/Objective:  78 yo presented after having a syncopal episode; s/p CABG on 2/13.                  Action/Plan: CM met with patient/spouse to discuss dispositional needs. Patient states he lived at home with his spouse and was very independent PTA. PCP verified as: Dr. [name]; pharmacy of choice: [facility]. PT eval complete with HHPT/rollator recommended with patient/spouse agreeable. CMS [facility] Compare list provided for HH/DME with [facility] selected. Patient requested a BSC to assist during his recovery. CM discussed the [facility] referral and rollator with Brenton Grills, [facility] liaison; [facility] referral given to Jeneen Rinks, [facility] liaison; AVS updated. Patients DME will be delivered prior to patient transitioning home. Patient will need orders for HHPT, rollator and bedside commode with a F2F. Spouse indicated she will be available to assist patient during his recovery. CM team will continue to follow.   Expected Discharge Date:                  Expected Discharge Plan:  [place]  In-House Referral:  NA  Discharge planning Services  CM Consult  Post Acute Care Choice:  Durable Medical Equipment, Home Health Choice offered to:  Spouse, Patient  DME Arranged:  Walker rolling with seat DME Agency:  [place]:  PT [facility] Agency:  [place]  Status of Service:  In process, will continue to follow  If discussed at Long Length of Stay Meetings, dates discussed:    Additional Comments:  Midge Minium RN, BSN, NCM-BC, ACM-RN [phone] 07/21/2018, 11:17 AM

## 2018-07-21 NOTE — Progress Notes (Signed)
After amb. In hall patient had a short run of At. Fib rate 157 . Patient voiced no complaints. And immed. Went back to [employer]. in the 70's

## 2018-07-21 NOTE — Progress Notes (Addendum)
      [address]       [place] [address]             [phone]      5 Days Post-Op Procedure(s) (LRB): CORONARY ARTERY BYPASS GRAFTING (CABG), ON PUMP, TIMES THREE, USING LEFT INTERNAL MAMMARY ARTERY AND LEFT GREATER SAHENOUS VEIN HARVESTED ENDOSCOPICALLY (N/A) TRANSESOPHAGEAL ECHOCARDIOGRAM (TEE) (N/A)   Subjective:  No new complaints.  States doing pretty well.  He has ambulated around SICU.  + BM  Objective: Vital signs in last 24 hours: Temp:  [97.6 F (36.4 C)-98.3 F (36.8 C)] 97.6 F (36.4 C) (02/18 0300) Pulse Rate:  [51-78] 62 (02/18 0800) Cardiac Rhythm: Normal sinus rhythm (02/18 0400) Resp:  [12-26] 18 (02/18 0800) BP: (85-146)/(57-84) 97/57 (02/18 0800) SpO2:  [87 %-99 %] 99 % (02/18 0800) Weight:  [75.9 kg] 75.9 kg (02/18 0600)  Intake/Output from previous day: 02/17 0701 - 02/18 0700 In: 480 [P.O.:480] Out: 565 [Urine:565]  General appearance: alert, cooperative and no distress Heart: regular rate and rhythm Lungs: clear to auscultation bilaterally Abdomen: soft, non-tender; bowel sounds normal; no masses,  no organomegaly Extremities: edema trace to 1+ Wound: clean and dry  Lab Results: Recent Labs    07/19/18 0449 07/20/18 0258  WBC 13.3* 10.4  HGB 8.3* 8.3*  HCT 25.4* 25.3*  PLT 171 193   BMET:  Recent Labs    07/19/18 0449 07/20/18 0258  NA 138 139  K 3.6 3.9  CL 107 107  CO2 25 27  GLUCOSE 124* 117*  BUN 14 18  CREATININE 0.75 0.80  CALCIUM 8.1* 8.3*    PT/INR: No results for input(s): LABPROT, INR in the last 72 hours. ABG    Component Value Date/Time   PHART 7.335 (L) 07/16/2018 1906   HCO3 22.7 07/16/2018 1906   TCO2 24 07/16/2018 1906   ACIDBASEDEF 3.0 (H) 07/16/2018 1906   O2SAT 98.0 07/16/2018 1906   CBG (last 3)  No results for input(s): GLUCAP in the last 72 hours.  Assessment/Plan: S/P Procedure(s) (LRB): CORONARY ARTERY BYPASS GRAFTING (CABG), ON PUMP, TIMES THREE, USING LEFT INTERNAL  MAMMARY ARTERY AND LEFT GREATER SAHENOUS VEIN HARVESTED ENDOSCOPICALLY (N/A) TRANSESOPHAGEAL ECHOCARDIOGRAM (TEE) (N/A)  1. CV- PAF, maintaining NSR on oral Amiodarone, + orthostatic hypotension, continue Midodrine 2. Pulm- no acute issues, continue IS 3. Renal- creatinine WNL, weight is elevated, will start Lasix 40 mg daily, supplement K for 3 doses 4. Expected post operative blood loss anemia, mild Hgb at 8.3, on iron 5. Dispo- patient stable, on Midodrine for orthostatic BP, maintaining NSR on oral Amiodarone, start low dose Lasix for 3 doses, transfer to 4E today   LOS: 7 days    Ellwood Handler 07/21/2018   Chart reviewed, patient examined, agree with above. Rhythm has been stable in sinus 70's on amiodarone 200 mg daily. BP has been stable on midodrine and he is able to ambulate around the ICU without dizziness.  Volume excess so will start diuresis. Transfer to 4E.

## 2018-07-21 NOTE — Progress Notes (Signed)
Physical Therapy Treatment Patient Details Name: Brian Terry MRN: [id number] DOB: [birth year] Today's Date: 07/21/2018    History of Present Illness Pt adm after having syncopal episode performing PFT prior to scheduled CABG on 2/13. Pt had had frequent syncopal episodes with bradycardica and hypotension in the week prior to admission. Pt underwent scheduled CABG x 4 on 2/13. Post op pt with hypotension pre-syncope. PMH - GERD    PT Comments    Pt making good progress. No dizziness or lightheadedness with ambulation.    Follow Up Recommendations  Home health PT;Supervision for mobility/OOB     Equipment Recommendations  Other (comment)(rollator)    Recommendations for Other Services       Precautions / Restrictions Precautions Precautions: Fall;Other (comment)(syncope, orthostasis) Precaution Comments: watch for syncope    Mobility  Bed Mobility               General bed mobility comments: Pt up in chair  Transfers Overall transfer level: Needs assistance Equipment used: 4-wheeled walker Transfers: Sit to/from Stand Sit to Stand: Min guard         General transfer comment: Assist for balance and verbal cues for hand placement  Ambulation/Gait Ambulation/Gait assistance: Min guard Gait Distance (Feet): 375 Feet Assistive device: 4-wheeled walker Gait Pattern/deviations: Step-through pattern;Decreased stride length Gait velocity: decr Gait velocity interpretation: 1.31 - 2.62 ft/sec, indicative of limited community ambulator General Gait Details: Assist for safety.   Stairs             Wheelchair Mobility    Modified Rankin (Stroke Patients Only)       Balance Overall balance assessment: Mild deficits observed, not formally tested                                          Cognition Arousal/Alertness: Awake/alert Behavior During Therapy: WFL for tasks assessed/performed Overall Cognitive Status: Within Functional Limits  for tasks assessed                                        Exercises      General Comments        Pertinent Vitals/Pain      Home Living Family/patient expects to be discharged to:: Private residence Living Arrangements: Spouse/significant other Available Help at Discharge: Family;Available 24 hours/day Type of Home: House Home Access: Stairs to enter Entrance Stairs-Rails: None Home Layout: Two level;Laundry or work area in basement;Able to live on main level with [employer]: None      Prior Function Level of Independence: Independent      Comments: Very active. Goes to gym daily. Works in yard and plays golf   PT Goals (current goals can now be found in the care plan section) Acute Rehab PT Goals Patient Stated Goal: return to golf Progress towards PT goals: Progressing toward goals    Frequency    Min 3X/week      PT Plan Current plan remains appropriate    Co-evaluation              AM-PAC PT "6 Clicks" Mobility   Outcome Measure  Help needed turning from your back to your side while in a flat bed without using bedrails?: A Little Help needed moving from lying on your back to sitting on the side  of a flat bed without using bedrails?: A Little Help needed moving to and from a bed to a chair (including a wheelchair)?: A Little Help needed standing up from a chair using your arms (e.g., wheelchair or bedside chair)?: A Little Help needed to walk in hospital room?: A Little Help needed climbing 3-5 steps with a railing? : A Little 6 Click Score: 18    End of Session   Activity Tolerance: Patient tolerated treatment well Patient left: in chair;with call bell/phone within reach Nurse Communication: Mobility status PT Visit Diagnosis: Unsteadiness on feet (R26.81);Other abnormalities of gait and mobility (R26.89);Muscle weakness (generalized) (M62.81)     Time: 8757-9728 PT Time Calculation (min) (ACUTE ONLY):  18 min  Charges:  $Gait Training: 8-22 mins                     [place] Pager [phone] Office [place] 07/21/2018, 1:55 PM

## 2018-07-22 ENCOUNTER — Inpatient Hospital Stay (HOSPITAL_COMMUNITY): Payer: Medicare Other

## 2018-07-22 LAB — GLUCOSE, CAPILLARY: Glucose-Capillary: <10 mg/dL — CL (ref 70–99)

## 2018-07-22 MED ORDER — AMIODARONE HCL 200 MG PO TABS
200.0000 mg | ORAL_TABLET | Freq: Two times a day (BID) | ORAL | Status: DC
  Administered 2018-07-22 – 2018-07-24 (×4): 200 mg via ORAL
  Filled 2018-07-22 (×4): qty 1

## 2018-07-22 NOTE — Progress Notes (Signed)
Pacer wires removed and intact. Pt tolerated well. Vitals monitored q 15 for 1 hr. Pt agrees to bedrest for 1 hr. Will monitor.  Jerald Kief

## 2018-07-22 NOTE — Progress Notes (Signed)
Pateint ambulated in hall approx. 420 feet in hall independently tolerated well.

## 2018-07-22 NOTE — Progress Notes (Signed)
PAtient back and laying in bed and converted back to [address]

## 2018-07-22 NOTE — Progress Notes (Signed)
Patietn is in x-ray and has gone into At.Fib 120-130  arrived back from x-ray at [phone]

## 2018-07-22 NOTE — Plan of Care (Signed)
  Problem: Education: Goal: Knowledge of General Education information will improve Description Including pain rating scale, medication(s)/side effects and non-pharmacologic comfort measures Outcome: Progressing   Problem: Health Behavior/Discharge Planning: Goal: Ability to manage health-related needs will improve Outcome: Progressing   

## 2018-07-22 NOTE — Progress Notes (Signed)
CARDIAC REHAB PHASE I   PRE:  Rate/Rhythm: 83 SR  BP:  Sitting: 98/64      SaO2: 97 RA  MODE:  Ambulation: 350 ft   POST:  Rate/Rhythm: 74 SR  BP:  Sitting: 121/82    SaO2: 98 RA   Pt helped to BR, then ambulated 365ft in hallway assist of one with front wheel walker. Pt denies CP or SOB. Helped back to bed, call bell and phone within reach. Will continue to follow.  [phone] Rufina Falco, RN BSN 07/22/2018 2:10 PM

## 2018-07-22 NOTE — Care Management Important Message (Signed)
Important Message  Patient Details  Name: Brian Terry MRN: [id number] Date of Birth: [birth year]   Medicare Important Message Given:  Yes    Barb Merino [place] 07/22/2018, 12:22 PM

## 2018-07-22 NOTE — Progress Notes (Addendum)
      [address]       ,[place] [address]             [phone]      6 Days Post-Op Procedure(s) (LRB): CORONARY ARTERY BYPASS GRAFTING (CABG), ON PUMP, TIMES THREE, USING LEFT INTERNAL MAMMARY ARTERY AND LEFT GREATER SAHENOUS VEIN HARVESTED ENDOSCOPICALLY (N/A) TRANSESOPHAGEAL ECHOCARDIOGRAM (TEE) (N/A)   Subjective:  No new complaints.  Some Atrial Fibrillation this morning when got up and went to CXR.  He has ambulated 4 times without dizziness.  Objective: Vital signs in last 24 hours: Temp:  [97.7 F (36.5 C)-99.1 F (37.3 C)] 98.7 F (37.1 C) (02/19 0717) Pulse Rate:  [61-73] 63 (02/19 0717) Cardiac Rhythm: Normal sinus rhythm (02/19 0700) Resp:  [17-25] 22 (02/19 0717) BP: (95-128)/(57-81) 95/81 (02/19 0717) SpO2:  [94 %-99 %] 94 % (02/19 0717) Weight:  [74.7 kg] 74.7 kg (02/19 0500)  Intake/Output from previous day: 02/18 0701 - 02/19 0700 In: 720 [P.O.:720] Out: 700 [Urine:700]  General appearance: alert, cooperative and no distress Heart: regular rate and rhythm Lungs: clear to auscultation bilaterally Abdomen: soft, non-tender; bowel sounds normal; no masses,  no organomegaly Extremities: edema trace Wound: clean and dry  Lab Results: Recent Labs    07/20/18 0258  WBC 10.4  HGB 8.3*  HCT 25.3*  PLT 193   BMET:  Recent Labs    07/20/18 0258  NA 139  K 3.9  CL 107  CO2 27  GLUCOSE 117*  BUN 18  CREATININE 0.80  CALCIUM 8.3*    PT/INR: No results for input(s): LABPROT, INR in the last 72 hours. ABG    Component Value Date/Time   PHART 7.335 (L) 07/16/2018 1906   HCO3 22.7 07/16/2018 1906   TCO2 24 07/16/2018 1906   ACIDBASEDEF 3.0 (H) 07/16/2018 1906   O2SAT 98.0 07/16/2018 1906   CBG (last 3)  No results for input(s): GLUCAP in the last 72 hours.  Assessment/Plan: S/P Procedure(s) (LRB): CORONARY ARTERY BYPASS GRAFTING (CABG), ON PUMP, TIMES THREE, USING LEFT INTERNAL MAMMARY ARTERY AND LEFT GREATER SAHENOUS  VEIN HARVESTED ENDOSCOPICALLY (N/A) TRANSESOPHAGEAL ECHOCARDIOGRAM (TEE) (N/A)  1. CV- Brief A. Fib with RVR this morning, currently on Amiodarone 200 mg daily, + orthostatic Hypotension- continue Midodrine 2. Pulm- no acute issues, continue IS 3. Renal- weight is up 10 lbs, continue Lasix, potassium 4. Expected post operative blood loss  Anemia, mild Hgb at 8.3- on iron 5. Dispo- patient stable, brief A. Fib this morning, currently in NSR, will d/c EPW, continue diuresis, monitor HR if any further A. Fib may need to start anticoagulation   LOS: 8 days    Ellwood Handler 07/22/2018   Chart reviewed, patient examined, agree with above. He has had some brief atrial fib with RVR last night and this am. Will increase amio to 200 bid since HR ok and no bradycardia. I would be hesitant to anticoagulate him with his recent hx of syncope until we are sure that it will not recur. Continue diuresis.

## 2018-07-23 NOTE — Progress Notes (Signed)
Physical Therapy Treatment Patient Details Name: Brian Terry MRN: [id number] DOB: 07/24/1940 Today's Date: 07/23/2018    History of Present Illness Pt adm after having syncopal episode performing PFT prior to scheduled CABG on 2/13. Pt had had frequent syncopal episodes with bradycardica and hypotension in the week prior to admission. Pt underwent scheduled CABG x 4 on 2/13. Post op pt with hypotension pre-syncope. PMH - GERD    PT Comments    Pt making good progress. Continue to recommend home with wife and HHPT.   Follow Up Recommendations  Home health PT;Supervision for mobility/OOB     Equipment Recommendations  Other (comment)(rollator)    Recommendations for Other Services       Precautions / Restrictions Precautions Precautions: Fall;Other (comment)(syncope, orthostasis) Precaution Comments: watch for syncope    Mobility  Bed Mobility Overal bed mobility: Modified Independent                Transfers Overall transfer level: Needs assistance Equipment used: 4-wheeled walker Transfers: Sit to/from Stand Sit to Stand: Supervision         General transfer comment: supervision for safety  Ambulation/Gait Ambulation/Gait assistance: Supervision Gait Distance (Feet): 375 Feet Assistive device: 4-wheeled walker Gait Pattern/deviations: Step-through pattern;Decreased stride length Gait velocity: decr Gait velocity interpretation: 1.31 - 2.62 ft/sec, indicative of limited community ambulator General Gait Details: supervision for safety   Stairs             Wheelchair Mobility    Modified Rankin (Stroke Patients Only)       Balance Overall balance assessment: Mild deficits observed, not formally tested                                          Cognition Arousal/Alertness: Awake/alert Behavior During Therapy: WFL for tasks assessed/performed Overall Cognitive Status: Within Functional Limits for tasks assessed                                         Exercises      General Comments        Pertinent Vitals/Pain      Home Living                      Prior Function            PT Goals (current goals can now be found in the care plan section) Progress towards PT goals: Progressing toward goals    Frequency    Min 3X/week      PT Plan Current plan remains appropriate    Co-evaluation              AM-PAC PT "6 Clicks" Mobility   Outcome Measure  Help needed turning from your back to your side while in a flat bed without using bedrails?: None Help needed moving from lying on your back to sitting on the side of a flat bed without using bedrails?: None Help needed moving to and from a bed to a chair (including a wheelchair)?: A Little Help needed standing up from a chair using your arms (e.g., wheelchair or bedside chair)?: A Little Help needed to walk in hospital room?: A Little Help needed climbing 3-5 steps with a railing? : A Little 6 Click Score: 20  End of Session   Activity Tolerance: Patient tolerated treatment well Patient left: with call bell/phone within reach;in bed Nurse Communication: Mobility status PT Visit Diagnosis: Unsteadiness on feet (R26.81);Other abnormalities of gait and mobility (R26.89);Muscle weakness (generalized) (M62.81)     Time: 8250-5397 PT Time Calculation (min) (ACUTE ONLY): 16 min  Charges:  $Gait Training: 8-22 mins                     [place] Pager [phone] Office [place] 07/23/2018, 1:51 PM

## 2018-07-23 NOTE — Progress Notes (Addendum)
      [address]       [place] [address]             [phone]      7 Days Post-Op Procedure(s) (LRB): CORONARY ARTERY BYPASS GRAFTING (CABG), ON PUMP, TIMES THREE, USING LEFT INTERNAL MAMMARY ARTERY AND LEFT GREATER SAHENOUS VEIN HARVESTED ENDOSCOPICALLY (N/A) TRANSESOPHAGEAL ECHOCARDIOGRAM (TEE) (N/A)   Subjective:  No new complaints.    Objective: Vital signs in last 24 hours: Temp:  [98 F (36.7 C)-98.7 F (37.1 C)] 98.7 F (37.1 C) (02/20 0405) Pulse Rate:  [40-69] 63 (02/20 0405) Cardiac Rhythm: Normal sinus rhythm (02/20 0700) Resp:  [14-23] 16 (02/20 0405) BP: (92-127)/(63-82) 101/74 (02/20 0405) SpO2:  [93 %-98 %] 94 % (02/20 0405) Weight:  [73.2 kg] 73.2 kg (02/20 0540)  Intake/Output from previous day: 02/19 0701 - 02/20 0700 In: 460 [P.O.:460] Out: 2475 [Urine:2475]  General appearance: alert, cooperative and no distress Heart: regular rate and rhythm Lungs: clear to auscultation bilaterally Abdomen: soft, non-tender; bowel sounds normal; no masses,  no organomegaly Extremities: edema trace Wound: clean and dry  Lab Results: No results for input(s): WBC, HGB, HCT, PLT in the last 72 hours. BMET: No results for input(s): NA, K, CL, CO2, GLUCOSE, BUN, CREATININE, CALCIUM in the last 72 hours.  PT/INR: No results for input(s): LABPROT, INR in the last 72 hours. ABG    Component Value Date/Time   PHART 7.335 (L) 07/16/2018 1906   HCO3 22.7 07/16/2018 1906   TCO2 24 07/16/2018 1906   ACIDBASEDEF 3.0 (H) 07/16/2018 1906   O2SAT 98.0 07/16/2018 1906   CBG (last 3)  No results for input(s): GLUCAP in the last 72 hours.  Assessment/Plan: S/P Procedure(s) (LRB): CORONARY ARTERY BYPASS GRAFTING (CABG), ON PUMP, TIMES THREE, USING LEFT INTERNAL MAMMARY ARTERY AND LEFT GREATER SAHENOUS VEIN HARVESTED ENDOSCOPICALLY (N/A) TRANSESOPHAGEAL ECHOCARDIOGRAM (TEE) (N/A)  1. CV- NSR with PACs, no further. A Fib on the monitor, BP stable-  continue Amiodarone, Midodrine 2. Pulm- no acute issues, continue IS 3. Renal- creatinine WNL. Weight is trending down continue Lasix, potassium, recheck BMET in AM 4. Dispo- patient stable, no further A. Fib, in NSR with PACs, continue Amiodarone, Midodrine, continue Lasix today, weight is trending down, will monitor HR today, repeat BMET in AM if remains stable, likely for d/c in AM   LOS: 9 days    Ellwood Handler 07/23/2018   Chart reviewed, patient examined, agree with above. He is diuresing well and weight coming down.  BP has been stable in the low normal range on midodrine but he is walking without dizziness or syncope. I suspect this will increase over time. No atrial fib over the past 24 hrs on amio 200 bid and no bradycardia. HR is 65-70's and increases appropriately with walking. He feels good and should be able to go home tomorrow if no changes.

## 2018-07-23 NOTE — Progress Notes (Signed)
CARDIAC REHAB PHASE I   Pt ambulated twice today, plans to walk with RN again later today. Will continue to follow.  Rufina Falco, RN BSN 07/23/2018 3:03 PM

## 2018-07-24 LAB — CBC
HEMATOCRIT: 27.1 % — AB (ref 39.0–52.0)
Hemoglobin: 8.8 g/dL — ABNORMAL LOW (ref 13.0–17.0)
MCH: 33.5 pg (ref 26.0–34.0)
MCHC: 32.5 g/dL (ref 30.0–36.0)
MCV: 103 fL — ABNORMAL HIGH (ref 80.0–100.0)
Platelets: 328 10*3/uL (ref 150–400)
RBC: 2.63 MIL/uL — ABNORMAL LOW (ref 4.22–5.81)
RDW: 14.4 % (ref 11.5–15.5)
WBC: 11.6 10*3/uL — ABNORMAL HIGH (ref 4.0–10.5)
nRBC: 0 % (ref 0.0–0.2)

## 2018-07-24 LAB — BASIC METABOLIC PANEL
Anion gap: 9 (ref 5–15)
BUN: 17 mg/dL (ref 8–23)
CO2: 25 mmol/L (ref 22–32)
Calcium: 8.8 mg/dL — ABNORMAL LOW (ref 8.9–10.3)
Chloride: 101 mmol/L (ref 98–111)
Creatinine, Ser: 0.99 mg/dL (ref 0.61–1.24)
GFR calc Af Amer: >60 mL/min (ref >60)
GFR calc non Af Amer: >60 mL/min (ref >60)
Glucose, Bld: 105 mg/dL — ABNORMAL HIGH (ref 70–99)
Potassium: 4.1 mmol/L (ref 3.5–5.1)
Sodium: 135 mmol/L (ref 135–145)

## 2018-07-24 MED ORDER — MIDODRINE HCL 10 MG PO TABS: 10.0000 mg | ORAL_TABLET | Freq: Three times a day (TID) | ORAL | 1 refills | Status: DC

## 2018-07-24 MED ORDER — TRAMADOL HCL 50 MG PO TABS: 50.0000 mg | ORAL_TABLET | ORAL | 0 refills | Status: DC | PRN

## 2018-07-24 MED ORDER — AMIODARONE HCL 200 MG PO TABS: 200.0000 mg | ORAL_TABLET | Freq: Two times a day (BID) | ORAL | 1 refills | Status: DC

## 2018-07-24 NOTE — Progress Notes (Addendum)
      [address]       [place] [address]             [phone]      8 Days Post-Op Procedure(s) (LRB): CORONARY ARTERY BYPASS GRAFTING (CABG), ON PUMP, TIMES THREE, USING LEFT INTERNAL MAMMARY ARTERY AND LEFT GREATER SAHENOUS VEIN HARVESTED ENDOSCOPICALLY (N/A) TRANSESOPHAGEAL ECHOCARDIOGRAM (TEE) (N/A)   Subjective:  No new complaints.  Ready to go home.  Objective: Vital signs in last 24 hours: Temp:  [98.2 F (36.8 C)-98.9 F (37.2 C)] 98.2 F (36.8 C) (02/21 0409) Pulse Rate:  [59-71] 59 (02/21 0409) Cardiac Rhythm: Normal sinus rhythm (02/21 0700) Resp:  [16-25] 16 (02/21 0409) BP: (102-119)/(62-82) 114/66 (02/21 0409) SpO2:  [96 %-99 %] 97 % (02/21 0409) Weight:  [72.3 kg] 72.3 kg (02/21 0006)  Intake/Output from previous day: 02/20 0701 - 02/21 0700 In: 360 [P.O.:360] Out: 1900 [Urine:1900]  General appearance: alert, cooperative and no distress Heart: regular rate and rhythm Lungs: clear to auscultation bilaterally Abdomen: soft, non-tender; bowel sounds normal; no masses,  no organomegaly Extremities: edema trace Wound: clean and dry  Lab Results: Recent Labs    07/24/18 0307  WBC 11.6*  HGB 8.8*  HCT 27.1*  PLT 328   BMET:  Recent Labs    07/24/18 0307  NA 135  K 4.1  CL 101  CO2 25  GLUCOSE 105*  BUN 17  CREATININE 0.99  CALCIUM 8.8*    PT/INR: No results for input(s): LABPROT, INR in the last 72 hours. ABG    Component Value Date/Time   PHART 7.335 (L) 07/16/2018 1906   HCO3 22.7 07/16/2018 1906   TCO2 24 07/16/2018 1906   ACIDBASEDEF 3.0 (H) 07/16/2018 1906   O2SAT 98.0 07/16/2018 1906   CBG (last 3)  No results for input(s): GLUCAP in the last 72 hours.  Assessment/Plan: S/P Procedure(s) (LRB): CORONARY ARTERY BYPASS GRAFTING (CABG), ON PUMP, TIMES THREE, USING LEFT INTERNAL MAMMARY ARTERY AND LEFT GREATER SAHENOUS VEIN HARVESTED ENDOSCOPICALLY (N/A) TRANSESOPHAGEAL ECHOCARDIOGRAM (TEE) (N/A)  1. CV-  NSR with some PVCs, no further A. Fib- continue Amiodarone, Midodrine 2. Pulm- no acute issues, continue IS 3. Renal- creatinine stable, weight is trending down, lasix is completed, K WNL 4. Expected post operative blood loss anemia, mild Hgb improving up to 8.8 5. Dispo- patient stable, will d/c home today   LOS: 10 days    Ellwood Handler 07/24/2018   Chart reviewed, patient examined, agree with above. Rhythm has been stable with no bradycardia and no dizziness or syncope. Plan home today.

## 2018-07-24 NOTE — Care Management Note (Addendum)
Case Management Note Original Note Marianna RN, BSN, NCM-BC, ACM-RN [phone] 07/21/2018, 11:17 AM   Patient Details  Name: Brian Terry MRN: [id number] Date of Birth: 11/20/1940  Subjective/Objective:  79 yo presented after having a syncopal episode; s/p CABG on 2/13.                  Action/Plan: CM met with patient/spouse to discuss dispositional needs. Patient states he lived at home with his spouse and was very independent PTA. PCP verified as: Dr. [name]; pharmacy of choice: [facility]. PT eval complete with HHPT/rollator recommended with patient/spouse agreeable. CMS [facility] Compare list provided for HH/DME with [facility] selected. Patient requested a BSC to assist during his recovery. CM discussed the [facility] referral and rollator with Brenton Grills, [facility] liaison; [facility] referral given to Jeneen Rinks, [facility] liaison; AVS updated. Patients DME will be delivered prior to patient transitioning home. Patient will need orders for HHPT, rollator and bedside commode with a F2F. Spouse indicated she will be available to assist patient during his recovery. CM team will continue to follow.   Expected Discharge Date:  07/24/18               Expected Discharge Plan:  [place]  In-House Referral:  NA  Discharge planning Services  CM Consult  Post Acute Care Choice:  Durable Medical Equipment, Home Health Choice offered to:  Spouse, Patient  DME Arranged:  Walker rolling with seat, 3-N-1 DME Agency:  [place]:  PT RN [facility] Agency:  [place]  Status of Service:  Completed, signed off  If discussed at [place] of Stay Meetings, dates discussed:    Discharge Disposition: home/home health   Additional Comments:  07/24/18- 1000- Marvetta Gibbons RN, CM- pt for transition home today, orders placed for DME- rollator and 3n1- call made to [facility] with [facility] for DME needs- 3n1 and rollator to be delivered to room prior to discharge. Order for  HHPT to be placed per PA prior to discharge- [place] already has referral- Linna Hoff made aware of discharge home today.    [phone] 4E Transitions of Care RN Case Manager 07/24/2018, 10:12 AM

## 2018-07-26 DIAGNOSIS — I480 Paroxysmal atrial fibrillation: Secondary | ICD-10-CM | POA: Diagnosis not present

## 2018-07-26 DIAGNOSIS — Z7982 Long term (current) use of aspirin: Secondary | ICD-10-CM | POA: Diagnosis not present

## 2018-07-26 DIAGNOSIS — Z951 Presence of aortocoronary bypass graft: Secondary | ICD-10-CM | POA: Diagnosis not present

## 2018-07-26 DIAGNOSIS — Z48812 Encounter for surgical aftercare following surgery on the circulatory system: Secondary | ICD-10-CM | POA: Diagnosis not present

## 2018-07-27 ENCOUNTER — Telehealth (HOSPITAL_COMMUNITY): Payer: Self-pay

## 2018-07-27 NOTE — Telephone Encounter (Signed)
Pt insurance is active and benefits verified through [place] $0.00, DED $0.00/0 met, out of pocket $5,900/$501.68 met, co-insurance 20%. no pre-authorization required, REF# [id number]  Will contact patient to see if he is interested in the Cardiac Rehab Program. If interested, patient will need to complete follow up appt. Once completed, patient will be contacted for scheduling upon review by the RN Navigator.

## 2018-07-27 NOTE — Telephone Encounter (Signed)
Called patient to see if he is interested in the Cardiac Rehab Program.Will contact patient for scheduling once f/u has been completed. Tedra Senegal. Support Rep II

## 2018-07-29 ENCOUNTER — Ambulatory Visit (INDEPENDENT_AMBULATORY_CARE_PROVIDER_SITE_OTHER): Payer: Self-pay | Admitting: *Deleted

## 2018-07-29 ENCOUNTER — Other Ambulatory Visit: Payer: Self-pay

## 2018-07-29 DIAGNOSIS — Z4802 Encounter for removal of sutures: Secondary | ICD-10-CM

## 2018-07-29 NOTE — Progress Notes (Signed)
Mr. Niazi returns s/p CABG X 3 on 07/16/18 with discharge on 07/24/18. He has been assigned HH/HHPT and is using a rolling seated walker at present.  He is doing well at home with his wife offering 24/7 assistance.  They had questions regarding use of upper arms and answers were provided.  His sternal., chest tube sites and left leg EVH incisions are are healing well.  I removed three sutures from his previous chest tube sites easily and they are closed and intact. Diet and bowels are good after early constipation that has resolved.  He is not requiring any pain meds. He has an appointment for f/u with cardiology. He will f/u in our office as scheduled with a CXR.

## 2018-08-03 ENCOUNTER — Telehealth: Payer: Self-pay

## 2018-08-03 ENCOUNTER — Ambulatory Visit: Payer: Medicare Other | Admitting: Physician Assistant

## 2018-08-03 VITALS — BP 110/68 | HR 71 | Ht 70.0 in | Wt 165.6 lb

## 2018-08-03 DIAGNOSIS — Z951 Presence of aortocoronary bypass graft: Secondary | ICD-10-CM | POA: Diagnosis not present

## 2018-08-03 DIAGNOSIS — Z79899 Other long term (current) drug therapy: Secondary | ICD-10-CM

## 2018-08-03 DIAGNOSIS — J90 Pleural effusion, not elsewhere classified: Secondary | ICD-10-CM

## 2018-08-03 DIAGNOSIS — E785 Hyperlipidemia, unspecified: Secondary | ICD-10-CM | POA: Diagnosis not present

## 2018-08-03 MED ORDER — FUROSEMIDE 20 MG PO TABS: 20.0000 mg | ORAL_TABLET | Freq: Every day | ORAL | 0 refills | Status: DC

## 2018-08-03 NOTE — Progress Notes (Signed)
Cardiology Office Note    Date:  08/05/2018   ID:  Brian Terry, DOB April 07, 1941, MRN [id number]  PCP:  [name], MD  Cardiologist:  Dr. [name]   Chief Complaint  Patient presents with  . Follow-up    seen for Dr. [name].     History of Present Illness:  Brian Terry is a 78 y.o. male with past medical history of hyperlipidemia who was referred for evaluation of coronary artery calcium seen on the previous CT scan.  Plain old exercise treadmill test came back positive with ST depression suggestive of ischemia.  Cardiac catheterization performed on 07/06/2018 showed severe three-vessel CAD with 60% distal left main, 70 to 80% proximal LAD, 80 to 90% ostial left circumflex stenosis, as well as chronic total occlusion of mid LAD with left to right collaterals.  Patient was discharged to have outpatient CT surgery consultation.  Patient had presyncope and was evaluated in the emergency room on 07/09/2018, this was felt to be vagal episode worsened by nitroglycerin tablet.  He ultimately underwent LIMA to LAD, SVG to OM and SVG to RCA by Dr. Gilford Raid on 07/16/2018.  Prior to the surgery, his beta-blocker was held due to bradycardia.  He tolerated the procedure very well and was able to be extubated in the early evening hours following the surgery.  He was started on Midrin for hypotension and later developed atrial fibrillation and converted to sinus rhythm with amiodarone use.  Since he remained bradycardic, his beta-blocker was not reinitiated.  Patient presents today for cardiology office visit.  Based on physical exam, he likely has a left pleural effusion, I will obtain a chest x-ray.  He also appears to be very volume overloaded with at least 3+ pitting edema in bilateral lower extremity.  I will add a Lasix 20 mg daily for 3 days before reassessment.  The problem was this patient is he has symptoms significant orthostatic hypotension requiring 3 times a day of Midrin, diuretic can  potentially make his orthostatic dizziness worse.  I will attempt as needed dose of diuretic in the future if at all possible.  Otherwise he will finish the current course of amiodarone then stop the amiodarone after that.  He is on 81 mg aspirin, I will check with CT surgery to see why he is not on the 325 mg aspirin.  Otherwise he denies significant chest discomfort.  He does have a tiny amount of drainage over one of the epigastric wound.  There is incisional redness, however there is no redness spreading out.  I will reassess the wound this Friday.   Past Medical History:  Diagnosis Date  . GERD (gastroesophageal reflux disease)   . Glaucoma   . Hemorrhoids   . Hyperlipidemia     Past Surgical History:  Procedure Laterality Date  . BALLOON DILATION N/A 03/01/2015   Procedure: BALLOON DILATION;  Surgeon: [name], MD;  Location: [facility];  Service: Endoscopy;  Laterality: N/A;  . CATARACT EXTRACTION    . CORONARY ARTERY BYPASS GRAFT N/A 07/16/2018   Procedure: CORONARY ARTERY BYPASS GRAFTING (CABG), ON PUMP, TIMES THREE, USING LEFT INTERNAL MAMMARY ARTERY AND LEFT GREATER SAHENOUS VEIN HARVESTED ENDOSCOPICALLY;  Surgeon: [name], MD;  Location: [place] [facility];  Service: Open Heart Surgery;  Laterality: N/A;  . ESOPHAGOGASTRODUODENOSCOPY N/A 03/01/2015   Procedure: ESOPHAGOGASTRODUODENOSCOPY (EGD);  Surgeon: [name], MD;  Location: [facility];  Service: Endoscopy;  Laterality: N/A;  . HERNIA REPAIR Left 2010  inguinal   . LEFT HEART CATH AND CORONARY ANGIOGRAPHY N/A 07/06/2018   Procedure: LEFT HEART CATH AND CORONARY ANGIOGRAPHY;  Surgeon: [name], MD;  Location: [facility];  Service: Cardiovascular;  Laterality: N/A;  . RETINAL DETACHMENT SURGERY Right 2000  . TEE WITHOUT CARDIOVERSION N/A 07/16/2018   Procedure: TRANSESOPHAGEAL ECHOCARDIOGRAM (TEE);  Surgeon: [name], MD;  Location: [facility];  Service: Open Heart Surgery;  Laterality: N/A;  . vein  removal Right    R leg    Current Medications: Outpatient Medications Prior to Visit  Medication Sig Dispense Refill  . acetaminophen (TYLENOL) 650 MG CR tablet Take 1,300 mg by mouth daily.     Marland Kitchen amiodarone (PACERONE) 200 MG tablet Take 1 tablet (200 mg total) by mouth 2 (two) times daily. X 7 days, then decrease to 200 mg daily 60 tablet 1  . Ascorbic Acid (VITAMIN C WITH ROSE HIPS) 1000 MG tablet Take 1,000 mg by mouth daily.    Marland Kitchen aspirin EC 81 MG tablet Take 1 tablet (81 mg total) by mouth daily.    . carbamide peroxide (DEBROX) 6.5 % OTIC solution Place 5 drops into the right ear daily as needed (for earwax build up).    . famotidine ([employer] ACID CONTROLLER MAX ST) 20 MG tablet Take 20 mg by mouth daily.    . midodrine (PROAMATINE) 10 MG tablet Take 1 tablet (10 mg total) by mouth 3 (three) times daily with meals. 90 tablet 1  . Multiple Vitamin (MULTIVITAMIN WITH MINERALS) TABS tablet Take 1 tablet by mouth daily.     . nitroGLYCERIN (NITROSTAT) 0.4 MG SL tablet Place 1 tablet (0.4 mg total) under the tongue every 5 (five) minutes as needed for chest pain. 30 tablet prn  . Psyllium (METAMUCIL PO) Take 2-4 capsules by mouth daily.     . rosuvastatin (CRESTOR) 20 MG tablet Take 1 tablet (20 mg total) by mouth daily. 90 tablet 3  . tamsulosin (FLOMAX) 0.4 MG CAPS capsule Take 0.4 mg by mouth daily after supper. 30 minutes after supper.    . timolol (TIMOPTIC) 0.5 % ophthalmic solution Place 1 drop into both eyes 2 (two) times daily.   98  . traMADol (ULTRAM) 50 MG tablet Take 1 tablet (50 mg total) by mouth every 4 (four) hours as needed for moderate pain. 30 tablet 0   No facility-administered medications prior to visit.      Allergies:   Sulfa antibiotics   Social History   Socioeconomic History  . Marital status: Married    Spouse name: Not on file  . Number of children: 0  . Years of education: Not on file  . Highest education level: Not on file  Occupational History  .  Occupation: Accoumting   Social Needs  . Financial resource strain: Not on file  . Food insecurity:    Worry: Not on file    Inability: Not on file  . Transportation needs:    Medical: Not on file    Non-medical: Not on file  Tobacco Use  . Smoking status: Never Smoker  . Smokeless tobacco: Never Used  Substance and Sexual Activity  . Alcohol use: No    Alcohol/week: 0.0 standard drinks  . Drug use: No  . Sexual activity: Not on file  Lifestyle  . Physical activity:    Days per week: Not on file    Minutes per session: Not on file  . Stress: Not on file  Relationships  .  Social connections:    Talks on phone: Not on file    Gets together: Not on file    Attends religious service: Not on file    Active member of club or organization: Not on file    Attends meetings of clubs or organizations: Not on file    Relationship status: Not on file  Other Topics Concern  . 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  No children.      Family History:  The patient's family history includes Colon polyps in his father; Heart disease in his mother; Heart disease (age of onset: 51) in his brother; Stomach cancer in his father.   ROS:   Please see the history of present illness.    ROS All other systems reviewed and are negative.   PHYSICAL EXAM:   VS:  BP 110/68   Pulse 71   Ht 5\' 10"  (1.778 m)   Wt 165 lb 9.6 oz (75.1 kg)   BMI 23.76 kg/m    GEN: Well nourished, well developed, in no acute distress  HEENT: normal  Neck: no JVD, carotid bruits, or masses Cardiac: RRR; no murmurs, rubs, or gallops. 3+ pitting edema  Respiratory:  clear to auscultation bilaterally, normal work of breathing GI: soft, nontender, nondistended, + BS MS: no deformity or atrophy  Skin: warm and dry, no rash Neuro:  Alert and Oriented x 3, Strength and sensation are intact Psych: euthymic mood, full affect  Wt Readings from Last 3 Encounters:  08/03/18 165 lb  9.6 oz (75.1 kg)  07/24/18 159 lb 6.3 oz (72.3 kg)  07/09/18 158 lb (71.7 kg)      Studies/Labs Reviewed:   EKG:  EKG is ordered today.  The ekg ordered today demonstrates NSR with HR 71.   Recent Labs: 06/26/2018: TSH 2.410 07/14/2018: ALT 18 07/17/2018: Magnesium 2.3 07/24/2018: BUN 17; Creatinine, Ser 0.99; Hemoglobin 8.8; Platelets 328; Potassium 4.1; Sodium 135   Lipid Panel    Component Value Date/Time   CHOL 260 (HH) 10/24/2006 1158   TRIG 78 10/24/2006 1158   HDL 72.7 10/24/2006 1158   CHOLHDL 3.6 CALC 10/24/2006 1158   VLDL 16 10/24/2006 1158   LDLDIRECT 164.4 10/24/2006 1158    Additional studies/ records that were reviewed today include:   Cath 07/06/2018 Conclusions: 1. Severe 3-vessel coronary artery disease, as detailed below, including 60% distal LMCA, 70-80% proximal LAD, and 80-90% ostial LCx stenoses, as well as chronic total occlusion of mid RCA with left-to-right collaterals. 2. Normal left ventricular contraction and filling pressure.  Recommendations: 1. Outpatient cardiac surgery consultation for CABG. 2. Aggressive secondary prevention. 3. Aspirin 81 mg daily and sublingual NTG as needed for chest pain.      ASSESSMENT:    1. Pleural effusion   2. S/P CABG x 3   3. Hyperlipidemia, unspecified hyperlipidemia type   4. Medication management      PLAN:  In order of problems listed above:  3. Pleural effusion: Patient appears to be volume overloaded on physical exam and also has a left pleural effusion.  The problem is he is on Midrin for orthostatic hypotension at the same time.  I will start him on Lasix 20 mg daily for 3 days only.  He will need a chest x-ray.  Consider for echocardiogram on next visit.  4. CAD s/p CABG: I will forward a note to his cardiothoracic surgeon as he appears to be just on  81 mg aspirin after recent bypass surgery.  He is not on any Plavix either.  His surgical scar seems to be fairly healed.  5. Hyperlipidemia:  On Crestor 20 mg daily.    Medication Adjustments/Labs and Tests Ordered: Current medicines are reviewed at length with the patient today.  Concerns regarding medicines are outlined above.  Medication changes, Labs and Tests ordered today are listed in the Patient Instructions below. Patient Instructions  Medication Instructions:  START LASIX 20MG  FOR 3 DAYS ONLY  If you need a refill on your cardiac medications before your next appointment, please call your pharmacy.  Labwork: FASTING LFT AND LIPID  HERE IN OUR OFFICE AT [facility]   You will need to fast. DO NOT EAT OR DRINK PAST MIDNIGHT.     Take the provided lab slips with you to the lab for your blood draw.    When you have your labs (blood work) drawn today and your tests are completely normal, you will receive your results only by MyChart Message (if you have MyChart) -OR-  A paper copy in the mail.  If you have any lab test that is abnormal or we need to change your treatment, we will call you to review these results.  Special Instructions: A chest x-ray takes a picture of the organs and structures inside the chest, including the heart, lungs, and blood vessels. This test can show several things, including, whether the heart is enlarges; whether fluid is building up in the lungs; and whether pacemaker / defibrillator leads are still in place. THIS WILL BE AT [facility] [address](SEE ATTACHED MAP)  Emmeline Winebarger WILL CALL YOU ABOUT THE ASPIRIN DOSING  Follow-Up: You will need a follow up appointment on Friday @ [place], PA-C (OK TO DOUBLE BOOK) At [facility], you and your health needs are our priority.  As part of our continuing mission to provide you with exceptional heart care, we have created designated Provider Care Teams.  These Care Teams include your primary Cardiologist (physician) and Advanced Practice Providers (APPs -  Physician Assistants and Nurse Practitioners) who all work together to provide you with  the care you need, when you need it.  Thank you for choosing [facility] at [facility], [place], [place]  08/05/2018 11:55 PM    [address], [place], [place]  [address] Phone: [phone]; Fax: [phone]

## 2018-08-03 NOTE — Telephone Encounter (Signed)
Patient's wife contacted the office over the weekend and stated she spoke with the physician on call regarding patient's ankles swelling stated physician on call gave some tips but did mention if those did not work he may need Lasix.  He is s/p CABG 07/16/2018 with Dr. Cyndia Bent.  She stated that his weight has stayed consistent and has not increased.  Advised to make sure he is elevating his legs when seated and try to wear compression stockings.  Patient does have an appointment with Cardiology today, advised to have his ankles looked at then.   She acknowledged receipt.

## 2018-08-03 NOTE — Patient Instructions (Addendum)
Medication Instructions:  START LASIX 20MG  FOR 3 DAYS ONLY  If you need a refill on your cardiac medications before your next appointment, please call your pharmacy.  Labwork: FASTING LFT AND LIPID  HERE IN OUR OFFICE AT [facility]   You will need to fast. DO NOT EAT OR DRINK PAST MIDNIGHT.     Take the provided lab slips with you to the lab for your blood draw.    When you have your labs (blood work) drawn today and your tests are completely normal, you will receive your results only by MyChart Message (if you have MyChart) -OR-  A paper copy in the mail.  If you have any lab test that is abnormal or we need to change your treatment, we will call you to review these results.  Special Instructions: A chest x-ray takes a picture of the organs and structures inside the chest, including the heart, lungs, and blood vessels. This test can show several things, including, whether the heart is enlarges; whether fluid is building up in the lungs; and whether pacemaker / defibrillator leads are still in place. THIS WILL BE AT [facility] [address](SEE ATTACHED MAP)  [facility] WILL CALL YOU ABOUT THE ASPIRIN DOSING  Follow-Up: You will need a follow up appointment on Friday @ [place], PA-C (OK TO DOUBLE BOOK) At [facility], you and your health needs are our priority.  As part of our continuing mission to provide you with exceptional heart care, we have created designated Provider Care Teams.  These Care Teams include your primary Cardiologist (physician) and Advanced Practice Providers (APPs -  Physician Assistants and Nurse Practitioners) who all work together to provide you with the care you need, when you need it.  Thank you for choosing [facility] at [facility]!!

## 2018-08-04 ENCOUNTER — Ambulatory Visit
Admission: RE | Admit: 2018-08-04 | Discharge: 2018-08-04 | Disposition: A | Discharge status: Home / Self Care | Payer: Medicare Other | Source: Ambulatory Visit | Attending: Adult Health | Admitting: Adult Health

## 2018-08-04 DIAGNOSIS — J90 Pleural effusion, not elsewhere classified: Secondary | ICD-10-CM

## 2018-08-05 ENCOUNTER — Encounter: Payer: Self-pay | Admitting: Physician Assistant

## 2018-08-07 ENCOUNTER — Ambulatory Visit: Payer: Medicare Other | Admitting: Physician Assistant

## 2018-08-07 ENCOUNTER — Encounter: Payer: Self-pay | Admitting: Physician Assistant

## 2018-08-07 VITALS — BP 94/69 | HR 73 | Ht 70.0 in | Wt 165.4 lb

## 2018-08-07 DIAGNOSIS — I2510 Atherosclerotic heart disease of native coronary artery without angina pectoris: Secondary | ICD-10-CM | POA: Diagnosis not present

## 2018-08-07 DIAGNOSIS — J90 Pleural effusion, not elsewhere classified: Secondary | ICD-10-CM | POA: Diagnosis not present

## 2018-08-07 DIAGNOSIS — I5033 Acute on chronic diastolic (congestive) heart failure: Secondary | ICD-10-CM | POA: Diagnosis not present

## 2018-08-07 DIAGNOSIS — E785 Hyperlipidemia, unspecified: Secondary | ICD-10-CM

## 2018-08-07 DIAGNOSIS — I951 Orthostatic hypotension: Secondary | ICD-10-CM

## 2018-08-07 NOTE — Progress Notes (Signed)
Cardiology Office Note    Date:  08/09/2018   ID:  Brian Terry, DOB [birth year], MRN [id number]  PCP:  [name], MD  Cardiologist: Dr. [name]    Chief Complaint  Patient presents with  . Follow-up    seen for Dr. [name].     History of Present Illness:  Brian Terry is a 78 y.o. male with past medical history of hyperlipidemia who was referred for evaluation of coronary artery calcium seen on the previous CT scan.  Plain old exercise treadmill test came back positive with ST depression suggestive of ischemia.  Cardiac catheterization performed on 07/06/2018 showed severe three-vessel CAD with 60% distal left main, 70 to 80% proximal LAD, 80 to 90% ostial left circumflex stenosis, as well as chronic total occlusion of mid LAD with left to right collaterals.  Patient was discharged to have outpatient CT surgery consultation.  Patient had presyncope and was evaluated in the emergency room on 07/09/2018, this was felt to be vagal episode worsened by nitroglycerin tablet.  He ultimately underwent LIMA to LAD, SVG to OM and SVG to RCA by Dr. Gilford Raid on 07/16/2018.  Prior to the surgery, his beta-blocker was held due to bradycardia.  He tolerated the procedure very well and was able to be extubated in the early evening hours following the surgery.  He was started on Midrin for hypotension and later developed atrial fibrillation and converted to sinus rhythm with amiodarone use.  Since he remained bradycardic, his beta-blocker was not reinitiated.  I last saw the patient on 08/03/2018, he appears to be volume overloaded.  He was on 81 mg aspirin, I send a staff message to Dr. Vivi Martens group to see if he should be on the 325 mg aspirin.  He also had markedly diminished breath sounds in the left base consistent with left pleural effusion as well.  This was confirmed on chest x-ray.  He had at least 2+ pitting edema in bilateral lower extremity.  I started him on a low-dose as needed Lasix.  I was  hesitant to put him on daily dosage due to his significant orthostatic hypotension.  He continued the Lasix for only 3 days.  He presents today for follow-up, he is lower extremity edema has improved.  The left pleural effusion still persist.  I will order a 3-week chest x-ray to follow-up.  I asked him to take the Lasix only on a as needed basis for weight gain and also increasing lower extremity edema.  Otherwise he denies any obvious chest discomfort.  The small drainage off of his epigastric incision site has stopped.  There is no localized sign of infection.   Past Medical History:  Diagnosis Date  . GERD (gastroesophageal reflux disease)   . Glaucoma   . Hemorrhoids   . Hyperlipidemia     Past Surgical History:  Procedure Laterality Date  . BALLOON DILATION N/A 03/01/2015   Procedure: BALLOON DILATION;  Surgeon: [name], MD;  Location: [facility];  Service: Endoscopy;  Laterality: N/A;  . CATARACT EXTRACTION    . CORONARY ARTERY BYPASS GRAFT N/A 07/16/2018   Procedure: CORONARY ARTERY BYPASS GRAFTING (CABG), ON PUMP, TIMES THREE, USING LEFT INTERNAL MAMMARY ARTERY AND LEFT GREATER SAHENOUS VEIN HARVESTED ENDOSCOPICALLY;  Surgeon: [name], MD;  Location: [facility];  Service: Open Heart Surgery;  Laterality: N/A;  . ESOPHAGOGASTRODUODENOSCOPY N/A 03/01/2015   Procedure: ESOPHAGOGASTRODUODENOSCOPY (EGD);  Surgeon: [name], MD;  Location: [facility];  Service: Endoscopy;  Laterality: N/A;  . HERNIA REPAIR Left 2010   inguinal   . LEFT HEART CATH AND CORONARY ANGIOGRAPHY N/A 07/06/2018   Procedure: LEFT HEART CATH AND CORONARY ANGIOGRAPHY;  Surgeon: [name], MD;  Location: [facility];  Service: Cardiovascular;  Laterality: N/A;  . RETINAL DETACHMENT SURGERY Right 2000  . TEE WITHOUT CARDIOVERSION N/A 07/16/2018   Procedure: TRANSESOPHAGEAL ECHOCARDIOGRAM (TEE);  Surgeon: [name], MD;  Location: [facility];  Service: Open Heart Surgery;  Laterality:  N/A;  . vein removal Right    R leg    Current Medications: Outpatient Medications Prior to Visit  Medication Sig Dispense Refill  . acetaminophen (TYLENOL) 650 MG CR tablet Take 1,300 mg by mouth daily.     Marland Kitchen amiodarone (PACERONE) 200 MG tablet Take 1 tablet (200 mg total) by mouth 2 (two) times daily. X 7 days, then decrease to 200 mg daily 60 tablet 1  . Ascorbic Acid (VITAMIN C WITH ROSE HIPS) 1000 MG tablet Take 1,000 mg by mouth daily.    Marland Kitchen aspirin EC 81 MG tablet Take 1 tablet (81 mg total) by mouth daily.    . carbamide peroxide (DEBROX) 6.5 % OTIC solution Place 5 drops into the right ear daily as needed (for earwax build up).    . famotidine ([employer] ACID CONTROLLER MAX ST) 20 MG tablet Take 20 mg by mouth daily.    . furosemide (LASIX) 20 MG tablet Take 1 tablet (20 mg total) by mouth daily. FOR 3 DAYS 30 tablet 0  . midodrine (PROAMATINE) 10 MG tablet Take 1 tablet (10 mg total) by mouth 3 (three) times daily with meals. 90 tablet 1  . Multiple Vitamin (MULTIVITAMIN WITH MINERALS) TABS tablet Take 1 tablet by mouth daily.     . nitroGLYCERIN (NITROSTAT) 0.4 MG SL tablet Place 1 tablet (0.4 mg total) under the tongue every 5 (five) minutes as needed for chest pain. 30 tablet prn  . Psyllium (METAMUCIL PO) Take 2-4 capsules by mouth daily.     . rosuvastatin (CRESTOR) 20 MG tablet Take 1 tablet (20 mg total) by mouth daily. 90 tablet 3  . tamsulosin (FLOMAX) 0.4 MG CAPS capsule Take 0.4 mg by mouth daily after supper. 30 minutes after supper.    . timolol (TIMOPTIC) 0.5 % ophthalmic solution Place 1 drop into both eyes 2 (two) times daily.   98  . traMADol (ULTRAM) 50 MG tablet Take 1 tablet (50 mg total) by mouth every 4 (four) hours as needed for moderate pain. 30 tablet 0   No facility-administered medications prior to visit.      Allergies:   Sulfa antibiotics   Social History   Socioeconomic History  . Marital status: Married    Spouse name: Not on file  . Number of  children: 0  . Years of education: Not on file  . Highest education level: Not on file  Occupational History  . Occupation: Accoumting   Social Needs  . Financial resource strain: Not on file  . Food insecurity:    Worry: Not on file    Inability: Not on file  . Transportation needs:    Medical: Not on file    Non-medical: Not on file  Tobacco Use  . Smoking status: Never Smoker  . Smokeless tobacco: Never Used  Substance and Sexual Activity  . Alcohol use: No    Alcohol/week: 0.0 standard drinks  . Drug use: No  . Sexual activity: Not on file  Lifestyle  . Physical activity:    Days per week: Not on file    Minutes per session: Not on file  . Stress: Not on file  Relationships  . Social connections:    Talks on phone: Not on file    Gets together: Not on file    Attends religious service: Not on file    Active member of club or organization: Not on file    Attends meetings of clubs or organizations: Not on file    Relationship status: Not on file  Other Topics Concern  . 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  No children.      Family History:  The patient's family history includes Colon polyps in his father; Heart disease in his mother; Heart disease (age of onset: 105) in his brother; Stomach cancer in his father.   ROS:   Please see the history of present illness.    ROS All other systems reviewed and are negative.   PHYSICAL EXAM:   VS:  BP 94/69   Pulse 73   Ht 5\' 10"  (1.778 m)   Wt 165 lb 6.4 Brian (75 kg)   BMI 23.73 kg/m    GEN: Well nourished, well developed, in no acute distress  HEENT: normal  Neck: no JVD, carotid bruits, or masses Cardiac: RRR; no murmurs, rubs, or gallops. 2+ edema  Respiratory:  clear to auscultation bilaterally, normal work of breathing GI: soft, nontender, nondistended, + BS MS: no deformity or atrophy  Skin: warm and dry, no rash Neuro:  Alert and Oriented x 3, Strength and sensation  are intact Psych: euthymic mood, full affect  Wt Readings from Last 3 Encounters:  08/07/18 165 lb 6.4 Brian (75 kg)  08/03/18 165 lb 9.6 Brian (75.1 kg)  07/24/18 159 lb 6.3 Brian (72.3 kg)      Studies/Labs Reviewed:   EKG:  EKG is not ordered today.   Recent Labs: 06/26/2018: TSH 2.410 07/14/2018: ALT 18 07/17/2018: Magnesium 2.3 07/24/2018: BUN 17; Creatinine, Ser 0.99; Hemoglobin 8.8; Platelets 328; Potassium 4.1; Sodium 135   Lipid Panel    Component Value Date/Time   CHOL 260 (HH) 10/24/2006 1158   TRIG 78 10/24/2006 1158   HDL 72.7 10/24/2006 1158   CHOLHDL 3.6 CALC 10/24/2006 1158   VLDL 16 10/24/2006 1158   LDLDIRECT 164.4 10/24/2006 1158    Additional studies/ records that were reviewed today include:   Cath 07/06/2018 Conclusions: 1. Severe 3-vessel coronary artery disease, as detailed below, including 60% distal LMCA, 70-80% proximal LAD, and 80-90% ostial LCx stenoses, as well as chronic total occlusion of mid RCA with left-to-right collaterals. 2. Normal left ventricular contraction and filling pressure.  Recommendations: 1. Outpatient cardiac surgery consultation for CABG. 2. Aggressive secondary prevention. 3. Aspirin 81 mg daily and sublingual NTG as needed for chest pain.   ASSESSMENT:    1. Acute on chronic diastolic (congestive) heart failure ([place])   2. CAD in native artery   3. Pleural effusion, left   4. Hyperlipidemia LDL goal <70   5. Orthostatic hypotension      PLAN:  In order of problems listed above:  3. Acute on chronic diastolic heart failure: Use Lasix on as-needed basis.  He continued to have at least 1+ pitting edema in bilateral lower extremity.  I am hesitant to put him on a scheduled regiment due to history of significant orthostatic hypotension requiring 3 times daily of  midodrine.  4. Left pleural effusion: Persistent on repeat examination.  Will order a 3-week chest x-ray.  May need thoracocentesis if continue to worsen  5. CAD  s/p CABG: He is on 81 mg aspirin, I have forwarded a staff message to his cardiothoracic surgeon to see if he should be on 325 mg aspirin.  Surgical site is well-healed  6. Hyperlipidemia: On Crestor 20 mg daily.  7. Orthostatic hypotension: On Midrin 10 mg 3 times daily.  He was hypotensive in the clinic today, I gave him some oral hydration.    Medication Adjustments/Labs and Tests Ordered: Current medicines are reviewed at length with the patient today.  Concerns regarding medicines are outlined above.  Medication changes, Labs and Tests ordered today are listed in the Patient Instructions below. Patient Instructions  Medication Instructions:  TAKE Lasix AS NEEDED FOR SWELLING, WEIGHT GAIN OF 2 POUNDS OVERNIGHT OR A WEIGHT GAIN OF 5 POUNDS IN A WEEK  If you need a refill on your cardiac medications before your next appointment, please call your pharmacy.   Lab work: Follow up with your Primary Care Doctor to have your labs drawn for a CBC, BMET, LIVER FUNCTION TEST AND LIPID  If you have labs (blood work) drawn today and your tests are completely normal, you will receive your results only by: Marland Kitchen MyChart Message (if you have MyChart) OR . A paper copy in the mail If you have any lab test that is abnormal or we need to change your treatment, we will call you to review the results.  Testing/Procedures: Your physician has requested that you have an Echocardiogram. Echocardiography is a painless test that uses sound waves to create images of your heart. It provides your doctor with information about the size and shape of your heart and how well your heart's chambers and valves are working. This procedure takes approximately one hour. There are no restrictions for this procedure.    Follow-Up: At [facility], you and your health needs are our priority.  As part of our continuing mission to provide you with exceptional heart care, we have created designated Provider Care Teams.  These Care  Teams include your primary Cardiologist (physician) and Advanced Practice Providers (APPs -  Physician Assistants and Nurse Practitioners) who all work together to provide you with the care you need, when you need it. . You will need a follow up appointment in 2 months, You will need to see Minus Breeding, MD    Any Other Special Instructions Will Be Listed Below (If Applicable). You will need to have a Chest X-ray in 3 weeks       Signed, Almyra Deforest, [place]  08/09/2018 11:41 PM    [facility] [address], [place], [place]  [address] Phone: [phone]; Fax: [phone]

## 2018-08-07 NOTE — Progress Notes (Signed)
Consistent with physical exam. Will reassess during office visit today

## 2018-08-07 NOTE — Patient Instructions (Addendum)
Medication Instructions:  TAKE Lasix AS NEEDED FOR SWELLING, WEIGHT GAIN OF 2 POUNDS OVERNIGHT OR A WEIGHT GAIN OF 5 POUNDS IN A WEEK  If you need a refill on your cardiac medications before your next appointment, please call your pharmacy.   Lab work: Follow up with your Primary Care Doctor to have your labs drawn for a CBC, BMET, LIVER FUNCTION TEST AND LIPID  If you have labs (blood work) drawn today and your tests are completely normal, you will receive your results only by: Marland Kitchen MyChart Message (if you have MyChart) OR . A paper copy in the mail If you have any lab test that is abnormal or we need to change your treatment, we will call you to review the results.  Testing/Procedures: Your physician has requested that you have an Echocardiogram. Echocardiography is a painless test that uses sound waves to create images of your heart. It provides your doctor with information about the size and shape of your heart and how well your heart's chambers and valves are working. This procedure takes approximately one hour. There are no restrictions for this procedure.    Follow-Up: At [facility], you and your health needs are our priority.  As part of our continuing mission to provide you with exceptional heart care, we have created designated Provider Care Teams.  These Care Teams include your primary Cardiologist (physician) and Advanced Practice Providers (APPs -  Physician Assistants and Nurse Practitioners) who all work together to provide you with the care you need, when you need it. . You will need a follow up appointment in 2 months, You will need to see Minus Breeding, MD    Any Other Special Instructions Will Be Listed Below (If Applicable). You will need to have a Chest X-ray in 3 weeks

## 2018-08-09 ENCOUNTER — Encounter: Payer: Self-pay | Admitting: Physician Assistant

## 2018-08-10 ENCOUNTER — Ambulatory Visit: Payer: Medicare Other | Admitting: Adult Health

## 2018-08-17 ENCOUNTER — Other Ambulatory Visit: Payer: Self-pay | Admitting: Physician Assistant

## 2018-08-17 MED ORDER — ASPIRIN EC 325 MG PO TBEC: 325.0000 mg | DELAYED_RELEASE_TABLET | Freq: Every day | ORAL | Status: DC

## 2018-08-18 ENCOUNTER — Other Ambulatory Visit: Payer: Self-pay | Admitting: Surgery

## 2018-08-18 ENCOUNTER — Ambulatory Visit (HOSPITAL_COMMUNITY): Payer: Medicare Other | Attending: Cardiovascular Disease

## 2018-08-18 ENCOUNTER — Other Ambulatory Visit: Payer: Self-pay

## 2018-08-18 DIAGNOSIS — I5033 Acute on chronic diastolic (congestive) heart failure: Secondary | ICD-10-CM | POA: Diagnosis not present

## 2018-08-18 DIAGNOSIS — Z951 Presence of aortocoronary bypass graft: Secondary | ICD-10-CM

## 2018-08-18 DIAGNOSIS — I2510 Atherosclerotic heart disease of native coronary artery without angina pectoris: Secondary | ICD-10-CM | POA: Diagnosis not present

## 2018-08-19 ENCOUNTER — Ambulatory Visit (INDEPENDENT_AMBULATORY_CARE_PROVIDER_SITE_OTHER): Payer: Self-pay | Admitting: Surgery

## 2018-08-19 ENCOUNTER — Ambulatory Visit
Admission: RE | Admit: 2018-08-19 | Discharge: 2018-08-19 | Disposition: A | Discharge status: Home / Self Care | Payer: Medicare Other | Source: Ambulatory Visit | Attending: Surgery | Admitting: Surgery

## 2018-08-19 ENCOUNTER — Encounter: Payer: Self-pay | Admitting: Surgery

## 2018-08-19 VITALS — BP 100/58 | HR 67 | Resp 20 | Ht 70.0 in | Wt 155.0 lb

## 2018-08-19 DIAGNOSIS — Z951 Presence of aortocoronary bypass graft: Secondary | ICD-10-CM | POA: Diagnosis not present

## 2018-08-19 DIAGNOSIS — I2510 Atherosclerotic heart disease of native coronary artery without angina pectoris: Secondary | ICD-10-CM

## 2018-08-19 DIAGNOSIS — J9811 Atelectasis: Secondary | ICD-10-CM | POA: Diagnosis not present

## 2018-08-19 DIAGNOSIS — J90 Pleural effusion, not elsewhere classified: Secondary | ICD-10-CM | POA: Diagnosis not present

## 2018-08-19 NOTE — Progress Notes (Signed)
HPI: Patient returns for routine postoperative follow-up having undergone coronary bypass graft surgery x3 on 07/16/2018. The patient's early postoperative recovery while in the hospital was notable for development of postoperative atrial fibrillation converted with amiodarone.  He also had borderline blood pressure and was slow to wean off Neo-Synephrine.  He did have some dizziness when up ambulating.  I started him on Midodrine and this seemed to stabilize his blood pressure in the 448-185 systolic range.  Since hospital discharge the patient reports that he has had some lower extremity edema.  He was seen by cardiology and started on Lasix for 3 days with improvement in his edema.  He still has some edema but is much better.  He has a prescription for as needed Lasix at home if he develops increased edema in his legs or weight gain.  He has been walking daily without chest pain or shortness of breath.  He has had no dizziness or syncope..   Current Outpatient Medications  Medication Sig Dispense Refill  . acetaminophen (TYLENOL) 650 MG CR tablet Take 1,300 mg by mouth daily.     Marland Kitchen amiodarone (PACERONE) 200 MG tablet Take 1 tablet (200 mg total) by mouth 2 (two) times daily. X 7 days, then decrease to 200 mg daily 60 tablet 1  . Ascorbic Acid (VITAMIN C WITH ROSE HIPS) 1000 MG tablet Take 1,000 mg by mouth daily.    Marland Kitchen aspirin EC 325 MG tablet Take 1 tablet (325 mg total) by mouth daily.    . carbamide peroxide (DEBROX) 6.5 % OTIC solution Place 5 drops into the right ear daily as needed (for earwax build up).    . famotidine ([employer] ACID CONTROLLER MAX ST) 20 MG tablet Take 20 mg by mouth daily.    . midodrine (PROAMATINE) 10 MG tablet Take 1 tablet (10 mg total) by mouth 3 (three) times daily with meals. 90 tablet 1  . Multiple Vitamin (MULTIVITAMIN WITH MINERALS) TABS tablet Take 1 tablet by mouth daily.     . nitroGLYCERIN (NITROSTAT) 0.4 MG SL tablet Place 1 tablet (0.4 mg total) under  the tongue every 5 (five) minutes as needed for chest pain. 30 tablet prn  . Psyllium (METAMUCIL PO) Take 2-4 capsules by mouth daily.     . rosuvastatin (CRESTOR) 20 MG tablet Take 1 tablet (20 mg total) by mouth daily. 90 tablet 3  . tamsulosin (FLOMAX) 0.4 MG CAPS capsule Take 0.4 mg by mouth daily after supper. 30 minutes after supper.    . timolol (TIMOPTIC) 0.5 % ophthalmic solution Place 1 drop into both eyes 2 (two) times daily.   98  . traMADol (ULTRAM) 50 MG tablet Take 1 tablet (50 mg total) by mouth every 4 (four) hours as needed for moderate pain. 30 tablet 0   No current facility-administered medications for this visit.     Physical Exam: BP (!) 100/58   Pulse 67   Resp 20   Ht 5\' 10"  (1.778 m)   Wt 155 lb (70.3 kg)   SpO2 97% Comment: RA  BMI 22.24 kg/m  He looks well. Cardiac exam shows a regular rate and rhythm with normal heart sounds. Lung exam is clear. The chest incision is healing well and the sternum is stable. His left leg incision is intact.  There is a small seroma beneath the incision medial to the knee.  There is mild bilateral lower extremity edema worse in the left leg than the right leg.  Diagnostic Tests:  CLINICAL DATA:  S post coronary bypass grafting  EXAM: CHEST - 2 VIEW  COMPARISON:  08/04/2018  FINDINGS: Cardiac shadow is enlarged but stable. Persistent left-sided pleural effusion is noted although slightly decreased when compared with the prior exam. Mild left basilar atelectasis is seen. Minimal right posterior fusion is noted. No bony abnormality is seen.  IMPRESSION: Slight improvement in left-sided pleural effusion when compare with the prior exam. Mild basilar atelectasis on the left is noted.  Stable small right pleural effusion.   Electronically Signed   By: Inez Catalina M.D.   On: 08/19/2018 10:45  Impression:  Overall I think he is making good progress following coronary bypass surgery.  He was having episodes  of bradycardia and hypotension with syncope preoperatively.  His blood pressure appears to be stable on Midodrine without any further dizziness or syncope since discharge.  I would plan to keep him on this at a dose of 10 mg 3 times daily for the next month and then will reevaluate his blood pressure and decide about decreasing the dose.  He appears to be maintaining sinus rhythm postoperatively and an EKG 1 week ago showed normal sinus rhythm.  I told him he could discontinue the amiodarone at this time.  He still has some excess volume with mild bilateral lower extremity edema and some residual left pleural effusion although it is improved from his prior chest x-ray.  He will continue to use Lasix as needed and I would expect this to gradually resolve on its own.  I recommended that he have a follow-up chest x-ray in about 1 month.  He did have an echocardiogram yesterday which showed ejection fraction of 45 to 50%.  There was no significant mitral or tricuspid regurgitation.  The aortic valve was calcified but there is no stenosis and trivial regurgitation.  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  Plan:  He will continue to follow-up with cardiology next month and I will plan to see him back in 1 month with a chest x-ray to follow-up on his pleural effusion.  He will continue Midodrine 10 mg 3 times daily for now.  He will discontinue the amiodarone.   [name], MD [facility] [phone]

## 2018-08-21 ENCOUNTER — Telehealth: Payer: Self-pay | Admitting: Physician Assistant

## 2018-08-21 DIAGNOSIS — E785 Hyperlipidemia, unspecified: Secondary | ICD-10-CM | POA: Diagnosis not present

## 2018-08-21 DIAGNOSIS — I5030 Unspecified diastolic (congestive) heart failure: Secondary | ICD-10-CM | POA: Diagnosis not present

## 2018-08-21 DIAGNOSIS — I959 Hypotension, unspecified: Secondary | ICD-10-CM | POA: Diagnosis not present

## 2018-08-21 DIAGNOSIS — I2510 Atherosclerotic heart disease of native coronary artery without angina pectoris: Secondary | ICD-10-CM | POA: Diagnosis not present

## 2018-08-21 NOTE — Telephone Encounter (Signed)
Follow up  ° ° °Pt is returning phone call  °Please call back  °

## 2018-08-21 NOTE — Telephone Encounter (Signed)
Result Notes for ECHOCARDIOGRAM COMPLETE   Notes recorded by Therisa Doyne on 08/21/2018 at 11:28 AM EDT Spoke to pt wife who said pt was not in right now. Could not give wife results--no DPR for our office--but asked that pt call back when he can. Pt wife stated they saw Dr. Cyndia Bent on Wednesday and he gave results of echo and CXR. Pt wife stated pt is feeling great and driving again. She stated she will have him call to follow up on these results when he is back. Pt wife verbalized thanks for the call. ------  Notes recorded by Almyra Deforest, PA on 08/20/2018 at 8:45 AM EDT Borderline low pumping function, fluid present in left lung is previously known on last chest x ray. Dr. Cyndia Bent during office visit yesterday recommended repeating chest x ray in 1 month after recent chest x ray showing improving degree of fluid, which is fine with me and can override my previous recommendation regarding repeat chest x ray

## 2018-08-21 NOTE — Telephone Encounter (Signed)
Called pt to see if he is interested in participating in the Cardiac rehab program.. pt stated that he is not interested. Closed referral. Tedra Senegal. Support Rep II

## 2018-08-24 ENCOUNTER — Telehealth (HOSPITAL_COMMUNITY): Payer: Self-pay | Admitting: *Deleted

## 2018-08-24 NOTE — Progress Notes (Signed)
Left voice message for the patient to call back for ECHO results. 

## 2018-08-24 NOTE — Telephone Encounter (Signed)
Left voice message for the patient to call back for ECHO results. 

## 2018-08-24 NOTE — Telephone Encounter (Signed)
Received message left by pt on answering machine.  Pt has changed his mind regarding participating in Cardiac Rehab.  Paperwork retrieved and referral re opened.  Called and left message for pt that when we do resume scheduling after  the department is is re-open for patients who are presently on hold due Covid- 19, he will me contacted.  Contact information provided. Cherre Huger, BSN Cardiac and Training and development officer

## 2018-09-15 DIAGNOSIS — Z961 Presence of intraocular lens: Secondary | ICD-10-CM | POA: Diagnosis not present

## 2018-09-15 DIAGNOSIS — H401131 Primary open-angle glaucoma, bilateral, mild stage: Secondary | ICD-10-CM | POA: Diagnosis not present

## 2018-09-15 DIAGNOSIS — D3132 Benign neoplasm of left choroid: Secondary | ICD-10-CM | POA: Diagnosis not present

## 2018-09-17 ENCOUNTER — Other Ambulatory Visit: Payer: Self-pay | Admitting: Physician Assistant

## 2018-09-21 ENCOUNTER — Other Ambulatory Visit: Payer: Self-pay | Admitting: Surgery

## 2018-09-21 DIAGNOSIS — Z951 Presence of aortocoronary bypass graft: Secondary | ICD-10-CM

## 2018-09-23 ENCOUNTER — Other Ambulatory Visit: Payer: Self-pay

## 2018-09-23 ENCOUNTER — Ambulatory Visit
Admission: RE | Admit: 2018-09-23 | Discharge: 2018-09-23 | Disposition: A | Discharge status: Home / Self Care | Payer: Medicare Other | Source: Ambulatory Visit | Attending: Surgery | Admitting: Surgery

## 2018-09-23 ENCOUNTER — Telehealth (HOSPITAL_COMMUNITY): Payer: Self-pay | Admitting: *Deleted

## 2018-09-23 ENCOUNTER — Encounter: Payer: Self-pay | Admitting: Surgery

## 2018-09-23 ENCOUNTER — Ambulatory Visit (INDEPENDENT_AMBULATORY_CARE_PROVIDER_SITE_OTHER): Payer: Self-pay | Admitting: Surgery

## 2018-09-23 VITALS — BP 88/60 | HR 60 | Temp 97.7°F | Resp 16 | Ht 70.0 in | Wt 144.0 lb

## 2018-09-23 DIAGNOSIS — I2510 Atherosclerotic heart disease of native coronary artery without angina pectoris: Secondary | ICD-10-CM

## 2018-09-23 DIAGNOSIS — Z951 Presence of aortocoronary bypass graft: Secondary | ICD-10-CM

## 2018-09-23 MED ORDER — MIDODRINE HCL 10 MG PO TABS: 10.0000 mg | ORAL_TABLET | Freq: Three times a day (TID) | ORAL | 2 refills | Status: DC

## 2018-09-23 NOTE — Progress Notes (Signed)
HPI:  Patient returns for routine postoperative follow-up having undergone coronary bypass graft surgery x3 on 07/16/2018.  He had postoperative atrial fibrillation and was converted with amiodarone.  This was discontinued at his last visit.  He also had borderline blood pressure and required Midodrine which he was sent home on.  When I saw him on his last visit his blood pressure was 100/58 and I decided to keep him on 10 mg 3 times daily.  He said that he was getting close to running out and decrease his dose to 10 mg twice daily for the last couple days.  He said they did notice slight dizziness this morning when he was taking a deep breath for his chest x-ray.  When I last saw him in the office he also had a small left pleural effusion and mild bilateral lower extremity edema.  This is subsequently resolved.  He said that he feels well overall and is walking daily without chest pain or shortness of breath.  He is asking about getting out to play some golf.  Current Outpatient Medications  Medication Sig Dispense Refill  . acetaminophen (TYLENOL) 650 MG CR tablet Take 650 mg by mouth daily.     . Ascorbic Acid (VITAMIN C WITH ROSE HIPS) 1000 MG tablet Take 1,000 mg by mouth daily.    Marland Kitchen aspirin EC 325 MG tablet Take 1 tablet (325 mg total) by mouth daily.    . carbamide peroxide (DEBROX) 6.5 % OTIC solution Place 5 drops into the right ear daily as needed (for earwax build up).    . famotidine ([employer] ACID CONTROLLER MAX ST) 20 MG tablet Take 20 mg by mouth daily.    . midodrine (PROAMATINE) 10 MG tablet Take 1 tablet (10 mg total) by mouth 3 (three) times daily with meals. 90 tablet 2  . Multiple Vitamin (MULTIVITAMIN WITH MINERALS) TABS tablet Take 1 tablet by mouth daily.     . Psyllium (METAMUCIL PO) Take 2-4 capsules by mouth daily.     . rosuvastatin (CRESTOR) 20 MG tablet Take 1 tablet (20 mg total) by mouth daily. 90 tablet 3  . tamsulosin (FLOMAX) 0.4 MG CAPS capsule Take 0.4 mg by  mouth daily after supper. 30 minutes after supper.    . timolol (TIMOPTIC) 0.5 % ophthalmic solution Place 1 drop into both eyes 2 (two) times daily.   98  . nitroGLYCERIN (NITROSTAT) 0.4 MG SL tablet Place 1 tablet (0.4 mg total) under the tongue every 5 (five) minutes as needed for chest pain. (Patient not taking: Reported on 09/23/2018) 30 tablet prn   No current facility-administered medications for this visit.      Physical Exam: BP (!) 88/60 (BP Location: Right Arm)   Pulse 60   Temp 97.7 F (36.5 C) (Temporal)   Resp 16   Ht 5\' 10"  (1.778 m)   Wt 144 lb (65.3 kg)   SpO2 98% Comment: RA  BMI 20.66 kg/m  He looks well. Cardiac exam shows regular rate and rhythm with normal heart sounds. Lungs are clear. The chest incision is well-healed and the sternum is stable. His leg incision is healed and there is no peripheral edema.  Diagnostic Tests:  CLINICAL DATA:  Patient status post CABG 07/16/2018.  EXAM: CHEST - 2 VIEW  COMPARISON:  PA and lateral chest 08/19/2018.  FINDINGS: 7 intact median sternotomy wires are unchanged. Heart size is normal. The lungs are clear. No pneumothorax. Left pleural effusion seen on the  prior examination has resolved. No right effusion. No acute or focal bony abnormality.  IMPRESSION: No acute disease.   Electronically Signed   By: Inge Rise M.D.   On: 09/23/2018 09:49    Impression:  Overall I think he is making a good recovery following his surgery.  His peripheral edema and pleural effusion have resolved.  His blood pressure is still borderline.  Systolic blood pressure was 88 this morning and 90 on repeat.  He seems to have some abnormal vasomotor tone which is probably present preoperatively since he was having intermittent episodes of dizziness and syncope prior to surgery.  Since being on Midodrine postoperatively he has had no further dizziness or syncope.  I recommended that he continue 10 mg 3 times daily for  now.  I sent an electronic prescription to renew this today.  I asked him to get a blood pressure cuff and check his blood pressure a few times per day at home so that he can document what it is.  I told him that if his blood pressure is over 119 systolic and he is feeling well he could consider decreasing the Midodrine to 5 mg 3 times daily to see how his blood pressure is affected and how he feels.  He may need to continue this medication indefinitely.  I told him that he could continue to increase his activity but should not return to heavy lifting or playing golf for 3 months postoperatively.  Plan:  He has a follow-up appointment with Dr. [name] on 10/09/2018 and will continue to follow-up with Dr. Rex Kras for his primary care.  I will be happy to see him back if the need arises.   [name], MD [facility] [phone]

## 2018-09-23 NOTE — Telephone Encounter (Signed)
Called and spoke to pt regarding Cardiac Rehab continued closure due to adherence of national recommendation for Covid  in group settings.  Pt verbalized understanding and remains interested in participating. Pt would like additional written resources for diet and exercise.  Pt not interested in videos by email and prefers written material.  Will mail to pt handouts on exercise and heart healthy eating.  Pt is appreciative of the call. Cherre Huger, BSN Cardiac and Training and development officer

## 2018-10-08 ENCOUNTER — Telehealth: Payer: Self-pay

## 2018-10-08 NOTE — Telephone Encounter (Signed)
Follow up  ° ° °Patient is returning your call. °

## 2018-10-08 NOTE — Progress Notes (Signed)
Virtual Visit via Video Note   This visit type was conducted due to national recommendations for restrictions regarding the COVID-19 Pandemic (e.g. social distancing) in an effort to limit this patient's exposure and mitigate transmission in our community.  Due to his co-morbid illnesses, this patient is at least at moderate risk for complications without adequate follow up.  This format is felt to be most appropriate for this patient at this time.  All issues noted in this document were discussed and addressed.  A limited physical exam was performed with this format.  Please refer to the patient's chart for his consent to telehealth for [facility].   Date:  10/09/2018   ID:  Brian Terry, DOB July 04, 1940, MRN [id number]  Patient Location: Home Provider Location: Home  PCP:  [name], MD  Cardiologist:  Minus Breeding, MD  Electrophysiologist:  None   Evaluation Performed:  Follow-Up Visit  Chief Complaint:  CAD  History of Present Illness:    Brian Terry is a 78 y.o. male for follow up of CAD/CABG.    Since he was seen last he has done very well.  The patient denies any new symptoms such as chest discomfort, neck or arm discomfort. There has been no new shortness of breath, PND or orthopnea. There have been no reported palpitations, presyncope or syncope.  He is walking daily 25 - 30 minutes.  He feels OK if he takes his midodrine but is light headed if he does not take this.    The patient does not have symptoms concerning for COVID-19 infection (fever, chills, cough, or new shortness of breath).    Past Medical History:  Diagnosis Date  . GERD (gastroesophageal reflux disease)   . Glaucoma   . Hemorrhoids   . Hyperlipidemia    Past Surgical History:  Procedure Laterality Date  . BALLOON DILATION N/A 03/01/2015   Procedure: BALLOON DILATION;  Surgeon: [name], MD;  Location: [facility];  Service: Endoscopy;  Laterality: N/A;  . CATARACT EXTRACTION    .  CORONARY ARTERY BYPASS GRAFT N/A 07/16/2018   Procedure: CORONARY ARTERY BYPASS GRAFTING (CABG), ON PUMP, TIMES THREE, USING LEFT INTERNAL MAMMARY ARTERY AND LEFT GREATER SAHENOUS VEIN HARVESTED ENDOSCOPICALLY;  Surgeon: [name], MD;  Location: [facility];  Service: Open Heart Surgery;  Laterality: N/A;  . ESOPHAGOGASTRODUODENOSCOPY N/A 03/01/2015   Procedure: ESOPHAGOGASTRODUODENOSCOPY (EGD);  Surgeon: [name], MD;  Location: [facility];  Service: Endoscopy;  Laterality: N/A;  . HERNIA REPAIR Left 2010   inguinal   . LEFT HEART CATH AND CORONARY ANGIOGRAPHY N/A 07/06/2018   Procedure: LEFT HEART CATH AND CORONARY ANGIOGRAPHY;  Surgeon: [name], MD;  Location: [facility];  Service: Cardiovascular;  Laterality: N/A;  . RETINAL DETACHMENT SURGERY Right 2000  . TEE WITHOUT CARDIOVERSION N/A 07/16/2018   Procedure: TRANSESOPHAGEAL ECHOCARDIOGRAM (TEE);  Surgeon: [name], MD;  Location: [facility];  Service: Open Heart Surgery;  Laterality: N/A;  . vein removal Right    R leg     Prior to Admission medications   Medication Sig Start Date End Date Taking? Authorizing Provider  acetaminophen (TYLENOL) 650 MG CR tablet Take 650 mg by mouth daily.     [provider]  Ascorbic Acid (VITAMIN C WITH ROSE HIPS) 1000 MG tablet Take 1,000 mg by mouth daily.    [provider]  aspirin EC 325 MG tablet Take 1 tablet (325 mg total) by mouth daily. 08/17/18 08/17/19  Eulas Post,  Isaac Laud, PA  carbamide peroxide (DEBROX) 6.5 % OTIC solution Place 5 drops into the right ear daily as needed (for earwax build up).    [provider]  famotidine (CVS ACID CONTROLLER MAX ST) 20 MG tablet Take 20 mg by mouth daily.    [provider]  midodrine (PROAMATINE) 10 MG tablet Take 1 tablet (10 mg total) by mouth 3 (three) times daily with meals. 09/23/18   [name], MD  Multiple Vitamin (MULTIVITAMIN WITH MINERALS) TABS tablet Take 1 tablet by mouth daily.      [provider]  nitroGLYCERIN (NITROSTAT) 0.4 MG SL tablet Place 1 tablet (0.4 mg total) under the tongue every 5 (five) minutes as needed for chest pain. Patient not taking: Reported on 09/23/2018 07/06/18 07/06/19  End, Harrell Gave, MD  Psyllium (METAMUCIL PO) Take 2-4 capsules by mouth daily.     [provider]  rosuvastatin (CRESTOR) 20 MG tablet Take 1 tablet (20 mg total) by mouth daily. 06/29/18 09/27/18  Minus Breeding, MD  tamsulosin (FLOMAX) 0.4 MG CAPS capsule Take 0.4 mg by mouth daily after supper. 30 minutes after supper.    [provider]  timolol (TIMOPTIC) 0.5 % ophthalmic solution Place 1 drop into both eyes 2 (two) times daily.  01/27/15   [provider]    Allergies:   Sulfa antibiotics   Social History   Tobacco Use  . Smoking status: Never Smoker  . Smokeless tobacco: Never Used  Substance Use Topics  . Alcohol use: No    Alcohol/week: 0.0 standard drinks  . Drug use: No     Family Hx: The patient's family history includes Colon polyps in his father; Heart disease in his mother; Heart disease (age of onset: 36) in his brother; Stomach cancer in his father.  ROS:   Please see the history of present illness.    As stated in the HPI and negative for all other systems.   Prior CV studies:   The following studies were reviewed today:  Labs  Labs/Other Tests and Data Reviewed:    EKG:  No ECG reviewed.  Recent Labs: 06/26/2018: TSH 2.410 07/14/2018: ALT 18 07/17/2018: Magnesium 2.3 07/24/2018: BUN 17; Creatinine, Ser 0.99; Hemoglobin 8.8; Platelets 328; Potassium 4.1; Sodium 135   Recent Lipid Panel Lab Results  Component Value Date/Time   CHOL 260 (HH) 10/24/2006 11:58 AM   TRIG 78 10/24/2006 11:58 AM   HDL 72.7 10/24/2006 11:58 AM   CHOLHDL 3.6 CALC 10/24/2006 11:58 AM   LDLDIRECT 164.4 10/24/2006 11:58 AM    Wt Readings from Last 3 Encounters:  09/23/18 144 lb (65.3 kg)  08/19/18 155 lb (70.3 kg)  08/07/18 165  lb 6.4 oz (75 kg)     Objective:    Vital Signs:  There were no vitals taken for this visit.   VITAL SIGNS:  reviewed GEN:  no acute distress EYES:  sclerae anicteric, EOMI - Extraocular Movements Intact NEURO:  alert and oriented x 3, no obvious focal deficit PSYCH:  normal affect  ASSESSMENT & PLAN:    Acute on chronic diastolic heart failure:    By history I would suspect that he is euvolemic.  I will continue current therapy.  I was able to see outside labs in March and his creat was stable.  No change in therapy.    Left pleural effusion:  This was resolved on follow up CXR on 4/22    CAD s/p CABG:    He was instructed  to reduce to 81 mg ASA.  It is still listed as the higher dose.   Hyperlipidemia:    I will ask him to get a lipid profile the next time he has labs done at his primary provider.    Orthostatic hypotension:   He need to continue the meds as listed above. ion.   COVID-19 Education: The signs and symptoms of COVID-19 were discussed with the patient and how to seek care for testing (follow up with PCP or arrange E-visit).  The importance of social distancing was discussed today.  Time:   Today, I have spent 16 minutes with the patient with telehealth technology discussing the above problems.     Medication Adjustments/Labs and Tests Ordered: Current medicines are reviewed at length with the patient today.  Concerns regarding medicines are outlined above.   Tests Ordered: No orders of the defined types were placed in this encounter.   Medication Changes: No orders of the defined types were placed in this encounter.   Disposition:  Follow up see me in six months  Signed, Minus Breeding, MD  10/09/2018 12:39 PM    [place]

## 2018-10-08 NOTE — Telephone Encounter (Signed)
Attempted to contact patient to review consent information for his upcoming appointment with Dr [name].  [email or web address]  If he returns call, please review the consent information (dot phrase = [email or web address]) with him while you have him on the phone. Thanks!!

## 2018-10-08 NOTE — Telephone Encounter (Signed)

## 2018-10-09 ENCOUNTER — Encounter: Payer: Self-pay | Admitting: Cardiology

## 2018-10-09 ENCOUNTER — Telehealth (INDEPENDENT_AMBULATORY_CARE_PROVIDER_SITE_OTHER): Payer: Medicare Other | Admitting: Cardiology

## 2018-10-09 DIAGNOSIS — I951 Orthostatic hypotension: Secondary | ICD-10-CM | POA: Insufficient documentation

## 2018-10-09 DIAGNOSIS — I2510 Atherosclerotic heart disease of native coronary artery without angina pectoris: Secondary | ICD-10-CM

## 2018-10-09 DIAGNOSIS — Z7189 Other specified counseling: Secondary | ICD-10-CM

## 2018-10-09 DIAGNOSIS — J90 Pleural effusion, not elsewhere classified: Secondary | ICD-10-CM | POA: Insufficient documentation

## 2018-10-09 DIAGNOSIS — E785 Hyperlipidemia, unspecified: Secondary | ICD-10-CM

## 2018-10-09 DIAGNOSIS — Z951 Presence of aortocoronary bypass graft: Secondary | ICD-10-CM

## 2018-10-09 NOTE — Patient Instructions (Signed)

## 2018-11-12 ENCOUNTER — Telehealth (HOSPITAL_COMMUNITY): Payer: Self-pay

## 2018-11-12 NOTE — Telephone Encounter (Signed)
Left pt VM regarding interest in Virtual Cardiac Rehab Program.    Carma Lair MS, ACSM CEP 1:26 PM 11/12/2018

## 2018-11-13 ENCOUNTER — Telehealth (HOSPITAL_COMMUNITY): Payer: Self-pay

## 2018-11-13 NOTE — Telephone Encounter (Signed)
Phone call made to Pt to provide information about virtual CR. Pt was not available to talk. Did speak to wife about when Pt would be available. Will follow up with Pt at 2:00.

## 2018-11-13 NOTE — Telephone Encounter (Signed)
Phone call returned to Pt to provide information about virtual CR. Pt was responsive and wants to move forward with virtual CR. Pt is scheduled for appointment to set up application on smart device.

## 2018-11-13 NOTE — Telephone Encounter (Signed)
         Confirm Consent - In the setting of the current Covid19 crisis, you are scheduled for a phone visit with your Cardiac or Pulmonary team member.  Just as we do with many in-gym visits, in order for you to participate in this visit, we must obtain consent.  If you'd like, I can send this to your mychart (if signed up) or email for you to review.  Otherwise, I can obtain your verbal consent now.  By agreeing to a telephone visit, we'd like you to understand that the technology does not allow for your Cardiac or Pulmonary Rehab team member to perform a physical assessment, and thus may limit their ability to fully assess your ability to perform exercise programs. If your provider identifies any concerns that need to be evaluated in person, we will make arrangements to do so.  Finally, though the technology is pretty good, we cannot assure that it will always work on either your or our end and we cannot ensure that we have a secure connection.  Cardiac and Pulmonary Rehab Telehealth visits and "At Home" cardiac and pulmonary rehab are provided at no cost to you.        Are you willing to proceed?"        STAFF: Did the patient verbally acknowledge consent to telehealth visit? Document YES/NO here: Yes     Deitra Mayo BS, ACSM CEP  Cardiac and Pulmonary Rehab Staff        11/13/2018    @  2:48 PM

## 2018-11-13 NOTE — Telephone Encounter (Signed)
  Dr.  [name]   As you are aware our department remains closed to patients due to Covid-19.  We are excited to be able to offer an alternative to traditional onsite Cardiac Rehab while your patient continues to follow Re-Open guidelines.  This is a notification that your patient has been contacted and is very interested in participating in Virtual Cardiac Rehab.  Thank you for your continued support in helping [place] meet the health care needs of our patients.  Deitra Mayo BS, ACSM CEP  Cardiac Rehab staff

## 2018-11-13 NOTE — Telephone Encounter (Signed)
Pt is interested in participating in Virtual Cardiac Rehab. Pt advised that Virtual Cardiac Rehab is provided at no cost to the patient.  Checklist:  1. Pt has smart device  ie smartphone and/or ipad for downloading an app  Yes 2. Reliable internet/wifi service    Yes 3. Understands how to use their smartphone and navigate within an app.  Yes   Reviewed with pt the scheduling process for virtual cardiac rehab.  Pt verbalized understanding.

## 2018-11-18 ENCOUNTER — Telehealth (HOSPITAL_COMMUNITY): Payer: Self-pay | Admitting: *Deleted

## 2018-11-18 ENCOUNTER — Encounter (HOSPITAL_COMMUNITY)
Admission: RE | Admit: 2018-11-18 | Discharge: 2018-11-18 | Disposition: A | Discharge status: Home / Self Care | Payer: Self-pay | Source: Ambulatory Visit | Attending: Cardiology | Admitting: Cardiology

## 2018-11-18 NOTE — Telephone Encounter (Signed)
Called and spoke to pt regarding Virtual Cardiac Rehab.  Pt  was able to download the [facility] app on their smart device with no issues. Pt set up their account and received the following welcome message -"Welcome to the [facility] and Pulmonary Rehabilitation program. We hope that you will find the exercise program beneficial in your recovery process. Our staff is available to assist with any questions/concerns about your exercise routine. Best wishes". Brief orientation provided to with the advisement to watch the "Intro to Rehab" series located under the Resource tab. Pt verbalized understanding. Will continue to follow and monitor pt progress with feedback as needed.Barnet Pall, RN,BSN 11/18/2018 2:30 PM

## 2018-11-19 ENCOUNTER — Telehealth: Payer: Self-pay | Admitting: Cardiology

## 2018-11-19 NOTE — Telephone Encounter (Signed)
Will forward to Rosa for review

## 2018-11-19 NOTE — Telephone Encounter (Signed)
OK to have dental cleaning- no need for SBE  Brian Terry [facility] PA-C 11/19/2018 4:31 PM

## 2018-11-19 NOTE — Telephone Encounter (Signed)
Advised patient, verbalized understanding  

## 2018-11-19 NOTE — Telephone Encounter (Signed)
Patient called stating he had heart surgery this past February.  He wants to know if he should keep his dental cleaning he has coming up next week Wednesday.

## 2018-11-25 DIAGNOSIS — Z0120 Encounter for dental examination and cleaning without abnormal findings: Secondary | ICD-10-CM | POA: Diagnosis not present

## 2018-11-27 ENCOUNTER — Encounter (HOSPITAL_COMMUNITY)
Admission: RE | Admit: 2018-11-27 | Discharge: 2018-11-27 | Disposition: A | Discharge status: Home / Self Care | Payer: Self-pay | Source: Ambulatory Visit | Attending: Cardiology | Admitting: Cardiology

## 2018-12-21 ENCOUNTER — Other Ambulatory Visit: Payer: Self-pay | Admitting: Cardiology

## 2018-12-21 DIAGNOSIS — Z951 Presence of aortocoronary bypass graft: Secondary | ICD-10-CM

## 2018-12-21 DIAGNOSIS — I2510 Atherosclerotic heart disease of native coronary artery without angina pectoris: Secondary | ICD-10-CM

## 2019-01-05 ENCOUNTER — Encounter (HOSPITAL_COMMUNITY)
Admission: RE | Admit: 2019-01-05 | Discharge: 2019-01-05 | Disposition: A | Discharge status: Home / Self Care | Payer: Medicare Other | Source: Ambulatory Visit | Attending: Cardiology | Admitting: Cardiology

## 2019-01-05 ENCOUNTER — Other Ambulatory Visit: Payer: Self-pay

## 2019-01-19 DIAGNOSIS — D1801 Hemangioma of skin and subcutaneous tissue: Secondary | ICD-10-CM | POA: Diagnosis not present

## 2019-01-19 DIAGNOSIS — L820 Inflamed seborrheic keratosis: Secondary | ICD-10-CM | POA: Diagnosis not present

## 2019-01-19 DIAGNOSIS — L821 Other seborrheic keratosis: Secondary | ICD-10-CM | POA: Diagnosis not present

## 2019-01-19 DIAGNOSIS — D225 Melanocytic nevi of trunk: Secondary | ICD-10-CM | POA: Diagnosis not present

## 2019-02-09 ENCOUNTER — Telehealth (HOSPITAL_COMMUNITY): Payer: Self-pay

## 2019-02-09 ENCOUNTER — Encounter (HOSPITAL_COMMUNITY)
Admission: RE | Admit: 2019-02-09 | Discharge: 2019-02-09 | Disposition: A | Discharge status: Home / Self Care | Payer: Medicare Other | Source: Ambulatory Visit | Attending: Cardiology | Admitting: Cardiology

## 2019-02-09 DIAGNOSIS — Z79899 Other long term (current) drug therapy: Secondary | ICD-10-CM | POA: Insufficient documentation

## 2019-02-09 DIAGNOSIS — Z951 Presence of aortocoronary bypass graft: Secondary | ICD-10-CM | POA: Insufficient documentation

## 2019-02-09 DIAGNOSIS — Z7982 Long term (current) use of aspirin: Secondary | ICD-10-CM | POA: Insufficient documentation

## 2019-02-09 DIAGNOSIS — E785 Hyperlipidemia, unspecified: Secondary | ICD-10-CM | POA: Insufficient documentation

## 2019-02-09 DIAGNOSIS — Z7901 Long term (current) use of anticoagulants: Secondary | ICD-10-CM | POA: Insufficient documentation

## 2019-02-09 DIAGNOSIS — H409 Unspecified glaucoma: Secondary | ICD-10-CM | POA: Insufficient documentation

## 2019-02-09 NOTE — Telephone Encounter (Signed)
Pt insurance is active and benefits verified through [facility] Co-pay 0, DED 0/0 met, out of pocket $5,900/'@2'$ ,778.05 met, co-insurance 20%. no pre-authorization required, REF# [id number]  Will contact patient to see if he is interested in the Cardiac Rehab Program. If interested, patient will need to complete follow up appt. Once completed, patient will be contacted for scheduling upon review by the RN Navigator.

## 2019-02-09 NOTE — Telephone Encounter (Signed)
Phone call to Pt to inquire about interest in CR program for in person rehab. Pt showed intrest through the virtual app. Pt information will be sent to support staff to get scheduled for orientation.

## 2019-02-10 ENCOUNTER — Telehealth (HOSPITAL_COMMUNITY): Payer: Self-pay | Admitting: Student-PharmD

## 2019-02-10 ENCOUNTER — Telehealth (HOSPITAL_COMMUNITY): Payer: Self-pay

## 2019-02-10 NOTE — Telephone Encounter (Signed)
Cardiac Rehab Medication Review by a Pharmacist  Does the patient  feel that his/her medications are working for him/her?  yes  Has the patient been experiencing any side effects to the medications prescribed?  Yes, slight dizziness with furosemide when need to take it.  Does the patient measure his/her own blood pressure or blood glucose at home?  no   Does the patient have any problems obtaining medications due to transportation or finances?   no  Understanding of regimen: excellent Understanding of indications: good Potential of compliance: excellent    Pharmacist comments: Patient is confident in medications and provided list.     Tatumn Corbridge L. Devin Going, PharmD, [place] PGY1 Pharmacy Resident 02/10/19      3:05 PM  Please check AMION for all [place] phone numbers After 10:00 PM, call the [facility] [phone]

## 2019-02-10 NOTE — Telephone Encounter (Signed)
Successful telephone encounter to Brian Terry to confirm Cardiac Rehab orientation appointment for Wednesday 02/11/2019 at 1330. Nursing assessment completed. Instructions for appointment provided. Patient screening for Covid-19 negative. Brian Macgregor E. Rollene Rotunda, RN, BSN

## 2019-02-11 ENCOUNTER — Encounter (HOSPITAL_COMMUNITY)
Admission: RE | Admit: 2019-02-11 | Discharge: 2019-02-11 | Disposition: A | Discharge status: Home / Self Care | Payer: Medicare Other | Source: Ambulatory Visit | Attending: Cardiology | Admitting: Cardiology

## 2019-02-11 ENCOUNTER — Other Ambulatory Visit: Payer: Self-pay

## 2019-02-11 ENCOUNTER — Encounter (HOSPITAL_COMMUNITY): Payer: Self-pay

## 2019-02-11 VITALS — BP 118/70 | HR 60 | Temp 97.9°F | Ht 71.0 in | Wt 155.9 lb

## 2019-02-11 DIAGNOSIS — Z951 Presence of aortocoronary bypass graft: Secondary | ICD-10-CM

## 2019-02-11 NOTE — Progress Notes (Signed)
Cardiac Individual Treatment Plan  Patient Details  Name: Brian Terry MRN: [id number] Date of Birth: [birth year] Referring Provider:     CARDIAC REHAB [facility] from 02/11/2019 in [place]  Referring Provider  Dr. Percival Terry       Initial Encounter Date:    CARDIAC REHAB PHASE II ORIENTATION from 02/11/2019 in [place]  Date  02/11/19      Visit Diagnosis: S/P CABG x 3  Patient's Home Medications on Admission:  Current Outpatient Medications:  .  acetaminophen (TYLENOL) 650 MG CR tablet, Take 650 mg by mouth daily. , Disp: , Rfl:  .  Ascorbic Acid (VITAMIN C WITH ROSE HIPS) 1000 MG tablet, Take 1,000 mg by mouth daily., Disp: , Rfl:  .  aspirin EC 81 MG tablet, Take 81 mg by mouth daily., Disp: , Rfl:  .  carbamide peroxide (DEBROX) 6.5 % OTIC solution, Place 5 drops into the right ear daily as needed (for earwax build up)., Disp: , Rfl:  .  famotidine ([employer] ACID CONTROLLER MAX ST) 20 MG tablet, Take 20 mg by mouth daily., Disp: , Rfl:  .  furosemide (LASIX) 20 MG tablet, Take 20 mg by mouth daily. Only for 3 days with fluid build up in left ankle, Disp: , Rfl:  .  midodrine (PROAMATINE) 10 MG tablet, TAKE  (1)  TABLET  THREE TIMES DAILY AFTER MEALS., Disp: 90 tablet, Rfl: 5 .  Multiple Vitamin (MULTIVITAMIN WITH MINERALS) TABS tablet, Take 1 tablet by mouth daily. , Disp: , Rfl:  .  nitroGLYCERIN (NITROSTAT) 0.4 MG SL tablet, Place 1 tablet (0.4 mg total) under the tongue every 5 (five) minutes as needed for chest pain., Disp: 30 tablet, Rfl: prn .  Psyllium (METAMUCIL PO), Take 2-4 capsules by mouth daily. , Disp: , Rfl:  .  rosuvastatin (CRESTOR) 20 MG tablet, Take 1 tablet (20 mg total) by mouth daily., Disp: 90 tablet, Rfl: 3 .  rosuvastatin (CRESTOR) 20 MG tablet, Take 20 mg by mouth daily after supper., Disp: , Rfl:  .  tamsulosin (FLOMAX) 0.4 MG CAPS capsule, Take 0.4 mg by mouth daily after supper. 30  minutes after supper., Disp: , Rfl:  .  timolol (TIMOPTIC) 0.5 % ophthalmic solution, Place 1 drop into both eyes 2 (two) times daily. , Disp: , Rfl: 59  Past Medical History: Past Medical History:  Diagnosis Date  . GERD (gastroesophageal reflux disease)   . Glaucoma   . Hemorrhoids   . Hyperlipidemia     Tobacco Use: Social History   Tobacco Use  Smoking Status Never Smoker  Smokeless Tobacco Never Used    Labs: Recent Review Flowsheet Data    Labs for ITP Cardiac and Pulmonary Rehab Latest Ref Rng & Units 07/16/2018 07/16/2018 07/16/2018 07/16/2018 07/16/2018   Cholestrol 0 - 200 mg/dL - - - - -   LDLDIRECT mg/dL - - - - -   HDL >39.0 mg/dL - - - - -   Trlycerides 0 - 149 mg/dL - - - - -   Hemoglobin A1c 4.8 - 5.6 % - - - - -   PHART 7.350 - 7.450 7.471(H) 7.463(H) 7.483(H) 7.352 7.335(L)   PCO2ART 32.0 - 48.0 mmHg 40.7 35.3 31.6(L) 44.6 42.8   HCO3 20.0 - 28.0 mmol/L 29.7(H) 25.3 24.1 24.7 22.7   TCO2 22 - 32 mmol/L 31 26 25 26 24    ACIDBASEDEF 0.0 - 2.0 mmol/L - - - 1.0 3.0(H)  O2SAT % 100.0 100.0 99.0 99.0 98.0      Capillary Blood Glucose: Lab Results  Component Value Date   GLUCAP 90 07/17/2018   GLUCAP 59 (L) 07/17/2018   GLUCAP <10 (LL) 07/17/2018   GLUCAP 111 (H) 07/17/2018   GLUCAP 118 (H) 07/17/2018     Exercise Target Goals: Exercise Program Goal: Individual exercise prescription set using results from initial 6 min walk test and THRR while considering  patient's activity barriers and safety.   Exercise Prescription Goal: Initial exercise prescription builds to 30-45 minutes a day of aerobic activity, 2-3 days per week.  Home exercise guidelines will be given to patient during program as part of exercise prescription that the participant will acknowledge.  Activity Barriers & Risk Stratification: Activity Barriers & Cardiac Risk Stratification - 02/11/19 1451      Activity Barriers & Cardiac Risk Stratification   Activity Barriers  None    Cardiac  Risk Stratification  High       6 Minute Walk: 6 Minute Walk    Row Name 02/11/19 1450         6 Minute Walk   Phase  Initial     Distance  1676 feet     Walk Time  6 minutes     # of Rest Breaks  0     MPH  3.1     METS  3.6     RPE  10     Perceived Dyspnea   0     VO2 Peak  12.6     Symptoms  No     Resting HR  60 bpm     Resting BP  118/70     Resting Oxygen Saturation   98 %     Exercise Oxygen Saturation  during 6 min walk  98 %     Max Ex. HR  98 bpm     Max Ex. BP  132/76     2 Minute Post BP  104/68        Oxygen Initial Assessment:   Oxygen Re-Evaluation:   Oxygen Discharge (Final Oxygen Re-Evaluation):   Initial Exercise Prescription: Initial Exercise Prescription - 02/11/19 1400      Date of Initial Exercise RX and Referring Provider   Date  02/11/19    Referring Provider  Dr. Percival Terry     Expected Discharge Date  03/26/19      Treadmill   MPH  2.6    Grade  1    Minutes  15      NuStep   Level  3    SPM  85    Minutes  15    METs  2.9      Prescription Details   Frequency (times per week)  3    Duration  Progress to 30 minutes of continuous aerobic without signs/symptoms of physical distress      Intensity   THRR 40-80% of Max Heartrate  57-114    Ratings of Perceived Exertion  11-13      Progression   Progression  Continue to progress workloads to maintain intensity without signs/symptoms of physical distress.      Resistance Training   Training Prescription  Yes    Weight  4 lbs.     Reps  10-15       Perform Capillary Blood Glucose checks as needed.  Exercise Prescription Changes:   Exercise Comments:   Exercise Goals and Review: Exercise Goals  Brian Terry Name 02/11/19 1455             Exercise Goals   Increase Physical Activity  Yes       Intervention  Provide advice, education, support and counseling about physical activity/exercise needs.;Develop an individualized exercise prescription for aerobic and  resistive training based on initial evaluation findings, risk stratification, comorbidities and participant's personal goals.       Expected Outcomes  Short Term: Attend rehab on a regular basis to increase amount of physical activity.;Long Term: Add in home exercise to make exercise part of routine and to increase amount of physical activity.;Long Term: Exercising regularly at least 3-5 days a week.       Increase Strength and Stamina  Yes       Intervention  Provide advice, education, support and counseling about physical activity/exercise needs.;Develop an individualized exercise prescription for aerobic and resistive training based on initial evaluation findings, risk stratification, comorbidities and participant's personal goals.       Expected Outcomes  Short Term: Increase workloads from initial exercise prescription for resistance, speed, and METs.;Short Term: Perform resistance training exercises routinely during rehab and add in resistance training at home;Long Term: Improve cardiorespiratory fitness, muscular endurance and strength as measured by increased METs and functional capacity (6MWT)       Able to understand and use rate of perceived exertion (RPE) scale  Yes       Intervention  Provide education and explanation on how to use RPE scale       Expected Outcomes  Short Term: Able to use RPE daily in rehab to express subjective intensity level;Long Term:  Able to use RPE to guide intensity level when exercising independently       Knowledge and understanding of Target Heart Rate Range (THRR)  Yes       Intervention  Provide education and explanation of THRR including how the numbers were predicted and where they are located for reference       Expected Outcomes  Short Term: Able to state/look up THRR;Long Term: Able to use THRR to govern intensity when exercising independently;Short Term: Able to use daily as guideline for intensity in rehab       Able to check pulse independently  Yes        Intervention  Provide education and demonstration on how to check pulse in carotid and radial arteries.;Review the importance of being able to check your own pulse for safety during independent exercise       Expected Outcomes  Short Term: Able to explain why pulse checking is important during independent exercise;Long Term: Able to check pulse independently and accurately       Understanding of Exercise Prescription  Yes       Intervention  Provide education, explanation, and written materials on patient's individual exercise prescription       Expected Outcomes  Short Term: Able to explain program exercise prescription;Long Term: Able to explain home exercise prescription to exercise independently          Exercise Goals Re-Evaluation :   Discharge Exercise Prescription (Final Exercise Prescription Changes):   Nutrition:  Target Goals: Understanding of nutrition guidelines, daily intake of sodium 1500mg , cholesterol 200mg , calories 30% from fat and 7% or less from saturated fats, daily to have 5 or more servings of fruits and vegetables.  Biometrics: Pre Biometrics - 02/11/19 1455      Pre Biometrics   Height  5\' 11"  (1.803 m)  Weight  70.7 kg    Waist Circumference  34 inches    Hip Circumference  39 inches    Waist to Hip Ratio  0.87 %    BMI (Calculated)  21.75    Triceps Skinfold  15 mm    % Body Fat  22.7 %    Grip Strength  31 kg    Flexibility  0 in    Single Leg Stand  3.87 seconds        Nutrition Therapy Plan and Nutrition Goals:   Nutrition Assessments:   Nutrition Goals Re-Evaluation:   Nutrition Goals Re-Evaluation:   Nutrition Goals Discharge (Final Nutrition Goals Re-Evaluation):   Psychosocial: Target Goals: Acknowledge presence or absence of significant depression and/or stress, maximize coping skills, provide positive support system. Participant is able to verbalize types and ability to use techniques and skills needed for reducing stress  and depression.  Initial Review & Psychosocial Screening: Initial Psych Review & Screening - 02/11/19 1557      Initial Review   Current issues with  Current Stress Concerns    Comments  low stress related to social isolation from COVID-19 pandemic restrictions.      Family Dynamics   Good Support System?  Yes      Barriers   Psychosocial barriers to participate in program  There are no identifiable barriers or psychosocial needs.      Screening Interventions   Interventions  Encouraged to exercise       Quality of Life Scores: Quality of Life - 02/11/19 1456      Quality of Life   Select  Quality of Life      Quality of Life Scores   Health/Function Pre  25.13 %    Socioeconomic Pre  22.29 %    Psych/Spiritual Pre  23.29 %    Family Pre  24.7 %    GLOBAL Pre  24.1 %      Scores of 19 and below usually indicate a poorer quality of life in these areas.  A difference of  2-3 points is a clinically meaningful difference.  A difference of 2-3 points in the total score of the Quality of Life Index has been associated with significant improvement in overall quality of life, self-image, physical symptoms, and general health in studies assessing change in quality of life.  PHQ-9: Recent Review Flowsheet Data    Depression screen [facility] 2/9 02/11/2019   Decreased Interest 0   Down, Depressed, Hopeless 1    PHQ - 2 Score 1     Interpretation of Total Score  Total Score Depression Severity:  1-4 = Minimal depression, 5-9 = Mild depression, 10-14 = Moderate depression, 15-19 = Moderately severe depression, 20-27 = Severe depression   Psychosocial Evaluation and Intervention:   Psychosocial Re-Evaluation:   Psychosocial Discharge (Final Psychosocial Re-Evaluation):   Vocational Rehabilitation: Provide vocational rehab assistance to qualifying candidates.   Vocational Rehab Evaluation & Intervention: Vocational Rehab - 02/11/19 1601      Initial Vocational Rehab  Evaluation & Intervention   Assessment shows need for Vocational Rehabilitation  No       Education: Education Goals: Education classes will be provided on a weekly basis, covering required topics. Participant will state understanding/return demonstration of topics presented.  Learning Barriers/Preferences: Learning Barriers/Preferences - 02/11/19 1457      Learning Barriers/Preferences   Learning Barriers  None    Learning Preferences  Individual Instruction;Skilled Demonstration       Education Topics:  Count Your Pulse:  -Group instruction provided by verbal instruction, demonstration, patient participation and written materials to support subject.  Instructors address importance of being able to find your pulse and how to count your pulse when at home without a heart monitor.  Patients get hands on experience counting their pulse with staff help and individually.   Heart Attack, Angina, and Risk Factor Modification:  -Group instruction provided by verbal instruction, video, and written materials to support subject.  Instructors address signs and symptoms of angina and heart attacks.    Also discuss risk factors for heart disease and how to make changes to improve heart health risk factors.   Functional Fitness:  -Group instruction provided by verbal instruction, demonstration, patient participation, and written materials to support subject.  Instructors address safety measures for doing things around the house.  Discuss how to get up and down off the floor, how to pick things up properly, how to safely get out of a chair without assistance, and balance training.   Meditation and Mindfulness:  -Group instruction provided by verbal instruction, patient participation, and written materials to support subject.  Instructor addresses importance of mindfulness and meditation practice to help reduce stress and improve awareness.  Instructor also leads participants through a meditation  exercise.    Stretching for Flexibility and Mobility:  -Group instruction provided by verbal instruction, patient participation, and written materials to support subject.  Instructors lead participants through series of stretches that are designed to increase flexibility thus improving mobility.  These stretches are additional exercise for major muscle groups that are typically performed during regular warm up and cool down.   Hands Only CPR:  -Group verbal, video, and participation provides a basic overview of AHA guidelines for community CPR. Role-play of emergencies allow participants the opportunity to practice calling for help and chest compression technique with discussion of AED use.   Hypertension: -Group verbal and written instruction that provides a basic overview of hypertension including the most recent diagnostic guidelines, risk factor reduction with self-care instructions and medication management.    Nutrition I class: Heart Healthy Eating:  -Group instruction provided by PowerPoint slides, verbal discussion, and written materials to support subject matter. The instructor gives an explanation and review of the Therapeutic Lifestyle Changes diet recommendations, which includes a discussion on lipid goals, dietary fat, sodium, fiber, plant stanol/sterol esters, sugar, and the components of a well-balanced, healthy diet.   Nutrition II class: Lifestyle Skills:  -Group instruction provided by PowerPoint slides, verbal discussion, and written materials to support subject matter. The instructor gives an explanation and review of label reading, grocery shopping for heart health, heart healthy recipe modifications, and ways to make healthier choices when eating out.   Diabetes Question & Answer:  -Group instruction provided by PowerPoint slides, verbal discussion, and written materials to support subject matter. The instructor gives an explanation and review of diabetes  co-morbidities, pre- and post-prandial blood glucose goals, pre-exercise blood glucose goals, signs, symptoms, and treatment of hypoglycemia and hyperglycemia, and foot care basics.   Diabetes Blitz:  -Group instruction provided by PowerPoint slides, verbal discussion, and written materials to support subject matter. The instructor gives an explanation and review of the physiology behind type 1 and type 2 diabetes, diabetes medications and rational behind using different medications, pre- and post-prandial blood glucose recommendations and Hemoglobin A1c goals, diabetes diet, and exercise including blood glucose guidelines for exercising safely.    Portion Distortion:  -Group instruction provided by PowerPoint slides, verbal discussion,  written materials, and food models to support subject matter. The instructor gives an explanation of serving size versus portion size, changes in portions sizes over the last 20 years, and what consists of a serving from each food group.   Stress Management:  -Group instruction provided by verbal instruction, video, and written materials to support subject matter.  Instructors review role of stress in heart disease and how to cope with stress positively.     Exercising on Your Own:  -Group instruction provided by verbal instruction, power point, and written materials to support subject.  Instructors discuss benefits of exercise, components of exercise, frequency and intensity of exercise, and end points for exercise.  Also discuss use of nitroglycerin and activating EMS.  Review options of places to exercise outside of rehab.  Review guidelines for sex with heart disease.   Cardiac Drugs I:  -Group instruction provided by verbal instruction and written materials to support subject.  Instructor reviews cardiac drug classes: antiplatelets, anticoagulants, beta blockers, and statins.  Instructor discusses reasons, side effects, and lifestyle considerations for each  drug class.   Cardiac Drugs II:  -Group instruction provided by verbal instruction and written materials to support subject.  Instructor reviews cardiac drug classes: angiotensin converting enzyme inhibitors (ACE-I), angiotensin II receptor blockers (ARBs), nitrates, and calcium channel blockers.  Instructor discusses reasons, side effects, and lifestyle considerations for each drug class.   Anatomy and Physiology of the Circulatory System:  Group verbal and written instruction and models provide basic cardiac anatomy and physiology, with the coronary electrical and arterial systems. Review of: AMI, Angina, Valve disease, Heart Failure, Peripheral Artery Disease, Cardiac Arrhythmia, Pacemakers, and the ICD.   Other Education:  -Group or individual verbal, written, or video instructions that support the educational goals of the cardiac rehab program.   Holiday Eating Survival Tips:  -Group instruction provided by PowerPoint slides, verbal discussion, and written materials to support subject matter. The instructor gives patients tips, tricks, and techniques to help them not only survive but enjoy the holidays despite the onslaught of food that accompanies the holidays.   Knowledge Questionnaire Score: Knowledge Questionnaire Score - 02/11/19 1457      Knowledge Questionnaire Score   Pre Score  21/24       Core Components/Risk Factors/Patient Goals at Admission: Personal Goals and Risk Factors at Admission - 02/11/19 1458      Core Components/Risk Factors/Patient Goals on Admission    Weight Management  Yes;Weight Maintenance    Admit Weight  155 lb 13.8 oz (70.7 kg)    Lipids  Yes    Intervention  Provide education and support for participant on nutrition & aerobic/resistive exercise along with prescribed medications to achieve LDL 70mg , HDL >40mg .    Expected Outcomes  Short Term: Participant states understanding of desired cholesterol values and is compliant with medications  prescribed. Participant is following exercise prescription and nutrition guidelines.;Long Term: Cholesterol controlled with medications as prescribed, with individualized exercise RX and with personalized nutrition plan. Value goals: LDL < 70mg , HDL > 40 mg.       Core Components/Risk Factors/Patient Goals Review:    Core Components/Risk Factors/Patient Goals at Discharge (Final Review):    ITP Comments: ITP Comments    Row Name 02/11/19 1436           ITP Comments  Dr. Fransico Him, Medical Director          Comments: Patient attended orientation on 02/11/2019 to review rules and guidelines for program.  Completed 6 minute walk test, Intitial ITP, and exercise prescription.  VSS. Telemetry-SA with rare PVC's.  Asymptomatic. Safety measures and social distancing in place per CDC guidelines.

## 2019-02-15 ENCOUNTER — Other Ambulatory Visit: Payer: Self-pay

## 2019-02-15 ENCOUNTER — Encounter (HOSPITAL_COMMUNITY)
Admission: RE | Admit: 2019-02-15 | Discharge: 2019-02-15 | Disposition: A | Discharge status: Home / Self Care | Payer: Medicare Other | Source: Ambulatory Visit | Attending: Cardiology | Admitting: Cardiology

## 2019-02-15 ENCOUNTER — Telehealth: Payer: Self-pay | Admitting: Medical

## 2019-02-15 DIAGNOSIS — H409 Unspecified glaucoma: Secondary | ICD-10-CM | POA: Diagnosis not present

## 2019-02-15 DIAGNOSIS — E785 Hyperlipidemia, unspecified: Secondary | ICD-10-CM | POA: Diagnosis not present

## 2019-02-15 DIAGNOSIS — Z79899 Other long term (current) drug therapy: Secondary | ICD-10-CM | POA: Diagnosis not present

## 2019-02-15 DIAGNOSIS — Z7901 Long term (current) use of anticoagulants: Secondary | ICD-10-CM | POA: Diagnosis not present

## 2019-02-15 DIAGNOSIS — Z951 Presence of aortocoronary bypass graft: Secondary | ICD-10-CM

## 2019-02-15 DIAGNOSIS — Z7982 Long term (current) use of aspirin: Secondary | ICD-10-CM | POA: Diagnosis not present

## 2019-02-15 NOTE — Telephone Encounter (Addendum)
I was paged by the cardiac rehab nurse, Verdis Frederickson, concerning patient. Today was the patient's first day at cardiac rehab. On his way out he mentioned he had 2/10 chest pressure for 10 out of the 20 minutes on the treadmill. The pain resolved with rest. No associated symptoms. B/P was 104/60. In NSR rates in the 70s. Patient mentioned he had played 9 holes of golf earlier this morning and felt this pain was likely due to overexertion. He says this is similar to chest discomfort he has felt before AND after the CABG in February.  I offered the patient be evaluated in the ER since we are unable to definitely r/o cardiac causes over the phone. He said he would rather go home since he is feeling back to baseline. Let the patient know to call or come to the ED if pain returns or worsens. Patient voiced his understanding. Will notify his primary cardiologist Dr. [name].  Verdis Frederickson would also like provider input if patient would be safe to resume exercise on Wednesday for his next Cardiac Rehab appointment. Await MD input on this matter vs whether he would need to be seen in the office first.   Kambrea Carrasco Kathlen Mody, PA-C

## 2019-02-15 NOTE — Progress Notes (Signed)
Daily Session Note  Patient Details  Name: Brian Terry MRN: [id number] Date of Birth: [birth year] Referring Provider:     CARDIAC REHAB PHASE II ORIENTATION from 02/11/2019 in [facility]  Referring Provider  Dr. [name]       Encounter Date: 02/15/2019  Check In: Session Check In - 02/15/19 1336      Check-In   Supervising physician immediately available to respond to emergencies  [facility] immediately available    Physician(s)  Dr. Benny Lennert    Location  [facility]    Staff Present  Deitra Mayo, BS, ACSM CEP, Exercise Physiologist;Oyinkansola Truax, RN, Luisa Hart, RN, Deland Pretty, MS, ACSM CEP, Exercise Physiologist    Virtual Visit  No    Medication changes reported      No    Fall or balance concerns reported     No    Tobacco Cessation  No Change    Warm-up and Cool-down  Performed on first and last piece of equipment    Resistance Training Performed  Yes    VAD Patient?  No    PAD/SET Patient?  No      Pain Assessment   Currently in Pain?  No/denies    Multiple Pain Sites  No       Capillary Blood Glucose: No results found for this or any previous visit (from the past 24 hour(s)).  Exercise Prescription Changes - 02/15/19 1600      Response to Exercise   Blood Pressure (Admit)  108/64    Blood Pressure (Exercise)  104/60    Blood Pressure (Exit)  106/62    Heart Rate (Admit)  71 bpm    Heart Rate (Exercise)  106 bpm    Heart Rate (Exit)  56 bpm    Rating of Perceived Exertion (Exercise)  13    Symptoms  chest discomfort. RN notified    Comments  Pt first day of CR program.     Duration  Continue with 30 min of aerobic exercise without signs/symptoms of physical distress.    Intensity  THRR unchanged      Progression   Progression  Continue to progress workloads to maintain intensity without signs/symptoms of physical distress.    Average METs  2.9      Resistance Training   Training  Prescription  Yes    Weight  4 lbs.     Reps  10-15    Time  10 Minutes      Treadmill   MPH  2.6    Grade  1    Minutes  10      NuStep   Level  2    SPM  85    Minutes  15    METs  2.5       Social History   Tobacco Use  Smoking Status Never Smoker  Smokeless Tobacco Never Used    Goals Met:  Patient completed exercise  Goals Unmet:  Not Applicable  Comments: [facility] started cardiac rehab today.  Pt was appeared fatigued on the treadmill at a speed of 2.4/1.0  Blood pressure 104/60. Heart rate 106 max heart rate. Telemetry rhythm Sinus. Medication list reconciled. Pt denies barriers to medicaiton compliance.  PSYCHOSOCIAL ASSESSMENT:  PHQ-0. Pt exhibits positive coping skills, hopeful outlook with supportive family. No psychosocial needs identified at this time, no psychosocial interventions necessary.    Pt enjoys playing golf.   Pt oriented to exercise equipment and  routine.  At the end of exercise Lynkin said that he experienced chest discomfort on the treadmill today. Patient rated the discomfort a 2/10 on a 0/10 and said the discomfort lasted about 10 minutes. Patient did not disclose that he had played 9 holes of golf this morning from 0900 until 1100 until his exercise was completed. Patient was instructed not to play golf prior to coming to exercise at cardiac rehab.  Understanding verbalized. Cadence Kathlen Mody PA called and notified about today's events. Cadence spoke with Mr Gutridge over the phone and advised him to go to the emergency room for evaluation. Cadance also told Mr Duddy if he did not want to go to the emergency room that he could go home since he is not having any chest discomfort currently. Cadence said that she will talk with Dr [name] and let Mr Parkerson know if he can return to exercise on Wednesday. Pt had no complaints upon exit from cardiac rehab. Exit blood pressure 106/62. Heart rate 56. Barnet Pall, RN,BSN 02/15/2019 4:35 PM   Dr. Fransico Him is  Medical Director for Cardiac Rehab at [facility].

## 2019-02-15 NOTE — Telephone Encounter (Signed)
Needs to be seen.  Schedule APP appt before rehab.

## 2019-02-16 NOTE — Telephone Encounter (Signed)
Patient scheduled 9/18 for visit and advised no cardiac rehab until seen

## 2019-02-17 ENCOUNTER — Encounter (HOSPITAL_COMMUNITY): Payer: Medicare Other

## 2019-02-18 ENCOUNTER — Encounter (HOSPITAL_COMMUNITY): Payer: Self-pay | Admitting: *Deleted

## 2019-02-18 DIAGNOSIS — Z951 Presence of aortocoronary bypass graft: Secondary | ICD-10-CM

## 2019-02-18 NOTE — Progress Notes (Signed)
Cardiology Office Note   Date:  02/19/2019   ID:  Brian Terry, DOB [birth year], MRN [id number]  PCP:  Brian Fess, MD  Cardiologist:   Minus Breeding, MD   No chief complaint on file.     History of Present Illness: Brian Terry is a 78 y.o. male who presents follow up of CAD/CABG.    we were called recently by rehab because he was having some chest discomfort and a thought some difficulty breathing on the treadmill.  He said he felt fine.  He was his first rehab.  Maybe he was a little short of breath because he was wearing a mask.  He says otherwise he is felt okay.  Has had twinges of some discomfort in his chest but he never really had chest pain before his bypass and is not having any substernal discomfort, neck or arm discomfort.  He is not having any resting shortness of breath, PND or orthopnea.  He rarely takes the Lasix if he has some ankle swelling.   Past Medical History:  Diagnosis Date  . GERD (gastroesophageal reflux disease)   . Glaucoma   . Hemorrhoids   . Hyperlipidemia     Past Surgical History:  Procedure Laterality Date  . BALLOON DILATION N/A 03/01/2015   Procedure: BALLOON DILATION;  Surgeon: [name], MD;  Location: [facility];  Service: Endoscopy;  Laterality: N/A;  . CATARACT EXTRACTION    . CORONARY ARTERY BYPASS GRAFT N/A 07/16/2018   Procedure: CORONARY ARTERY BYPASS GRAFTING (CABG), ON PUMP, TIMES THREE, USING LEFT INTERNAL MAMMARY ARTERY AND LEFT GREATER SAHENOUS VEIN HARVESTED ENDOSCOPICALLY;  Surgeon: [name], MD;  Location: [facility];  Service: Open Heart Surgery;  Laterality: N/A;  . ESOPHAGOGASTRODUODENOSCOPY N/A 03/01/2015   Procedure: ESOPHAGOGASTRODUODENOSCOPY (EGD);  Surgeon: [name], MD;  Location: [facility];  Service: Endoscopy;  Laterality: N/A;  . HERNIA REPAIR Left 2010   inguinal   . LEFT HEART CATH AND CORONARY ANGIOGRAPHY N/A 07/06/2018   Procedure: LEFT HEART CATH AND CORONARY ANGIOGRAPHY;  Surgeon: [name], MD;  Location: [facility];  Service: Cardiovascular;  Laterality: N/A;  . RETINAL DETACHMENT SURGERY Right 2000  . TEE WITHOUT CARDIOVERSION N/A 07/16/2018   Procedure: TRANSESOPHAGEAL ECHOCARDIOGRAM (TEE);  Surgeon: [name], MD;  Location: [facility];  Service: Open Heart Surgery;  Laterality: N/A;  . vein removal Right    R leg     Current Outpatient Medications  Medication Sig Dispense Refill  . acetaminophen (TYLENOL) 650 MG CR tablet Take 650 mg by mouth daily.     . Ascorbic Acid (VITAMIN C WITH ROSE HIPS) 1000 MG tablet Take 1,000 mg by mouth daily.    Marland Kitchen aspirin EC 81 MG tablet Take 81 mg by mouth daily.    . carbamide peroxide (DEBROX) 6.5 % OTIC solution Place 5 drops into the right ear daily as needed (for earwax build up).    . furosemide (LASIX) 20 MG tablet Take 20 mg by mouth daily. Only for 3 days with fluid build up in left ankle    . midodrine (PROAMATINE) 10 MG tablet TAKE  (1)  TABLET  THREE TIMES DAILY AFTER MEALS. 90 tablet 5  . Multiple Vitamin (MULTIVITAMIN WITH MINERALS) TABS tablet Take 1 tablet by mouth daily.     . nitroGLYCERIN (NITROSTAT) 0.4 MG SL tablet Place 1 tablet (0.4 mg total) under the tongue every 5 (five) minutes as needed for chest pain. 30 tablet  prn  . Psyllium (METAMUCIL PO) Take 2-4 capsules by mouth daily.     . rosuvastatin (CRESTOR) 20 MG tablet Take 20 mg by mouth daily after supper.    . tamsulosin (FLOMAX) 0.4 MG CAPS capsule Take 0.4 mg by mouth daily after supper. 30 minutes after supper.    . timolol (TIMOPTIC) 0.5 % ophthalmic solution Place 1 drop into both eyes 2 (two) times daily.   98   No current facility-administered medications for this visit.     Allergies:   Sulfa antibiotics    ROS:  Please see the history of present illness.   Otherwise, review of systems are positive for none.   All other systems are reviewed and negative.    PHYSICAL EXAM: VS:  BP (!) 102/58   Pulse (!) 49   Temp (!) 97 F  (36.1 C) (Temporal)   Ht 5\' 11"  (1.803 m)   Wt 152 lb (68.9 kg)   SpO2 98%   BMI 21.20 kg/m  , BMI Body mass index is 21.2 kg/m. GENERAL:  Well appearing NECK:  No jugular venous distention, waveform within normal limits, carotid upstroke brisk and symmetric, no bruits, no thyromegaly LUNGS:  Clear to auscultation bilaterally CHEST:  Well healed sternotomy scar. HEART:  PMI not displaced or sustained,S1 and S2 within normal limits, no S3, no S4, no clicks, no rubs, no murmurs ABD:  Flat, positive bowel sounds normal in frequency in pitch, no bruits, no rebound, no guarding, no midline pulsatile mass, no hepatomegaly, no splenomegaly EXT:  2 plus pulses throughout, no edema, no cyanosis no clubbing    EKG:  EKG is ordered today. The ekg ordered today demonstrates sinus bradycardia, rate 50, left axis deviation, no acute ST-T wave changes, borderline interventricular conduction delay.   Recent Labs: 06/26/2018: TSH 2.410 07/14/2018: ALT 18 07/17/2018: Magnesium 2.3 07/24/2018: BUN 17; Creatinine, Ser 0.99; Hemoglobin 8.8; Platelets 328; Potassium 4.1; Sodium 135    Lipid Panel    Component Value Date/Time   CHOL 260 (HH) 10/24/2006 1158   TRIG 78 10/24/2006 1158   HDL 72.7 10/24/2006 1158   CHOLHDL 3.6 CALC 10/24/2006 1158   VLDL 16 10/24/2006 1158   LDLDIRECT 164.4 10/24/2006 1158      Wt Readings from Last 3 Encounters:  02/19/19 152 lb (68.9 kg)  02/11/19 155 lb 13.8 oz (70.7 kg)  09/23/18 144 lb (65.3 kg)      Other studies Reviewed: Additional studies/ records that were reviewed today include: None. Review of the above records demonstrates:  NA   ASSESSMENT AND PLAN:  Chronic diastolic heart failure:    He seems to be euvolemic.  We talked about PRN dosing of his Lasix and how little he should use given his orthostasis.  He has good instructions on this now.  CADs/pCABG:    His chest pain is atypical.  He had no acute findings on his EKG.  He has no  contraindication to returning to rehab.  Hyperlipidemia:    I will check a fasting lipid profile liver enzymes when he returns.  Orthostatic hypotension:  Again he is to use his diuretic sparingly.   Current medicines are reviewed at length with the patient today.  The patient does not have concerns regarding medicines.  The following changes have been made:  no change  Labs/ tests ordered today include:  No orders of the defined types were placed in this encounter.    Disposition:   FU with me in six months.  Signed, Minus Breeding, MD  02/19/2019 11:12 AM    [place]

## 2019-02-18 NOTE — Progress Notes (Signed)
Cardiac Individual Treatment Plan  Patient Details  Name: Brian Terry MRN: [id number] Date of Birth: 18-Feb-1941 Referring Provider:     CARDIAC REHAB [facility] from 02/11/2019 in [place]  Referring Provider  Dr. [name]       Initial Encounter Date:    CARDIAC REHAB PHASE II ORIENTATION from 02/11/2019 in [place] [facility]  Date  02/11/19      Visit Diagnosis: S/P CABG x 3  Patient's Home Medications on Admission:  Current Outpatient Medications:  .  acetaminophen (TYLENOL) 650 MG CR tablet, Take 650 mg by mouth daily. , Disp: , Rfl:  .  Ascorbic Acid (VITAMIN C WITH ROSE HIPS) 1000 MG tablet, Take 1,000 mg by mouth daily., Disp: , Rfl:  .  aspirin EC 81 MG tablet, Take 81 mg by mouth daily., Disp: , Rfl:  .  carbamide peroxide (DEBROX) 6.5 % OTIC solution, Place 5 drops into the right ear daily as needed (for earwax build up)., Disp: , Rfl:  .  famotidine ([employer] ACID CONTROLLER MAX ST) 20 MG tablet, Take 20 mg by mouth daily., Disp: , Rfl:  .  furosemide (LASIX) 20 MG tablet, Take 20 mg by mouth daily. Only for 3 days with fluid build up in left ankle, Disp: , Rfl:  .  midodrine (PROAMATINE) 10 MG tablet, TAKE  (1)  TABLET  THREE TIMES DAILY AFTER MEALS., Disp: 90 tablet, Rfl: 5 .  Multiple Vitamin (MULTIVITAMIN WITH MINERALS) TABS tablet, Take 1 tablet by mouth daily. , Disp: , Rfl:  .  nitroGLYCERIN (NITROSTAT) 0.4 MG SL tablet, Place 1 tablet (0.4 mg total) under the tongue every 5 (five) minutes as needed for chest pain., Disp: 30 tablet, Rfl: prn .  Psyllium (METAMUCIL PO), Take 2-4 capsules by mouth daily. , Disp: , Rfl:  .  rosuvastatin (CRESTOR) 20 MG tablet, Take 1 tablet (20 mg total) by mouth daily., Disp: 90 tablet, Rfl: 3 .  rosuvastatin (CRESTOR) 20 MG tablet, Take 20 mg by mouth daily after supper., Disp: , Rfl:  .  tamsulosin (FLOMAX) 0.4 MG CAPS capsule, Take 0.4 mg by mouth daily after supper. 30  minutes after supper., Disp: , Rfl:  .  timolol (TIMOPTIC) 0.5 % ophthalmic solution, Place 1 drop into both eyes 2 (two) times daily. , Disp: , Rfl: 14  Past Medical History: Past Medical History:  Diagnosis Date  . GERD (gastroesophageal reflux disease)   . Glaucoma   . Hemorrhoids   . Hyperlipidemia     Tobacco Use: Social History   Tobacco Use  Smoking Status Never Smoker  Smokeless Tobacco Never Used    Labs: Recent Review Flowsheet Data    Labs for ITP Cardiac and Pulmonary Rehab Latest Ref Rng & Units 07/16/2018 07/16/2018 07/16/2018 07/16/2018 07/16/2018   Cholestrol 0 - 200 mg/dL - - - - -   LDLDIRECT mg/dL - - - - -   HDL >39.0 mg/dL - - - - -   Trlycerides 0 - 149 mg/dL - - - - -   Hemoglobin A1c 4.8 - 5.6 % - - - - -   PHART 7.350 - 7.450 7.471(H) 7.463(H) 7.483(H) 7.352 7.335(L)   PCO2ART 32.0 - 48.0 mmHg 40.7 35.3 31.6(L) 44.6 42.8   HCO3 20.0 - 28.0 mmol/L 29.7(H) 25.3 24.1 24.7 22.7   TCO2 22 - 32 mmol/L 31 26 25 26 24    ACIDBASEDEF 0.0 - 2.0 mmol/L - - - 1.0 3.0(H)  O2SAT % 100.0 100.0 99.0 99.0 98.0      Capillary Blood Glucose: Lab Results  Component Value Date   GLUCAP 90 07/17/2018   GLUCAP 59 (L) 07/17/2018   GLUCAP <10 (LL) 07/17/2018   GLUCAP 111 (H) 07/17/2018   GLUCAP 118 (H) 07/17/2018     Exercise Target Goals: Exercise Program Goal: Individual exercise prescription set using results from initial 6 min walk test and THRR while considering  patient's activity barriers and safety.   Exercise Prescription Goal: Starting with aerobic activity 30 plus minutes a day, 3 days per week for initial exercise prescription. Provide home exercise prescription and guidelines that participant acknowledges understanding prior to discharge.  Activity Barriers & Risk Stratification: Activity Barriers & Cardiac Risk Stratification - 02/11/19 1451      Activity Barriers & Cardiac Risk Stratification   Activity Barriers  None    Cardiac Risk  Stratification  High       6 Minute Walk: 6 Minute Walk    Row Name 02/11/19 1450         6 Minute Walk   Phase  Initial     Distance  1676 feet     Walk Time  6 minutes     # of Rest Breaks  0     MPH  3.1     METS  3.6     RPE  10     Perceived Dyspnea   0     VO2 Peak  12.6     Symptoms  No     Resting HR  60 bpm     Resting BP  118/70     Resting Oxygen Saturation   98 %     Exercise Oxygen Saturation  during 6 min walk  98 %     Max Ex. HR  98 bpm     Max Ex. BP  132/76     2 Minute Post BP  104/68        Oxygen Initial Assessment:   Oxygen Re-Evaluation:   Oxygen Discharge (Final Oxygen Re-Evaluation):   Initial Exercise Prescription: Initial Exercise Prescription - 02/11/19 1400      Date of Initial Exercise RX and Referring Provider   Date  02/11/19    Referring Provider  Dr. [name]     Expected Discharge Date  03/26/19      Treadmill   MPH  2.6    Grade  1    Minutes  15      NuStep   Level  3    SPM  85    Minutes  15    METs  2.9      Prescription Details   Frequency (times per week)  3    Duration  Progress to 30 minutes of continuous aerobic without signs/symptoms of physical distress      Intensity   THRR 40-80% of Max Heartrate  57-114    Ratings of Perceived Exertion  11-13      Progression   Progression  Continue to progress workloads to maintain intensity without signs/symptoms of physical distress.      Resistance Training   Training Prescription  Yes    Weight  4 lbs.     Reps  10-15       Perform Capillary Blood Glucose checks as needed.  Exercise Prescription Changes: Exercise Prescription Changes    Row Name 02/15/19 1600  Response to Exercise   Blood Pressure (Admit)  108/64       Blood Pressure (Exercise)  104/60       Blood Pressure (Exit)  106/62       Heart Rate (Admit)  71 bpm       Heart Rate (Exercise)  106 bpm       Heart Rate (Exit)  56 bpm       Rating of Perceived Exertion  (Exercise)  13       Symptoms  chest discomfort. RN notified       Comments  Pt first day of CR program.        Duration  Continue with 30 min of aerobic exercise without signs/symptoms of physical distress.       Intensity  THRR unchanged         Progression   Progression  Continue to progress workloads to maintain intensity without signs/symptoms of physical distress.       Average METs  2.9         Resistance Training   Training Prescription  Yes       Weight  4 lbs.        Reps  10-15       Time  10 Minutes         Treadmill   MPH  2.6       Grade  1       Minutes  10         NuStep   Level  2       SPM  85       Minutes  15       METs  2.5          Exercise Comments: Exercise Comments    Row Name 02/15/19 1624           Exercise Comments  Pt first day of CR program.          Exercise Goals and Review: Exercise Goals    [place] Name 02/11/19 1455             Exercise Goals   Increase Physical Activity  Yes       Intervention  Provide advice, education, support and counseling about physical activity/exercise needs.;Develop an individualized exercise prescription for aerobic and resistive training based on initial evaluation findings, risk stratification, comorbidities and participant's personal goals.       Expected Outcomes  Short Term: Attend rehab on a regular basis to increase amount of physical activity.;Long Term: Add in home exercise to make exercise part of routine and to increase amount of physical activity.;Long Term: Exercising regularly at least 3-5 days a week.       Increase Strength and Stamina  Yes       Intervention  Provide advice, education, support and counseling about physical activity/exercise needs.;Develop an individualized exercise prescription for aerobic and resistive training based on initial evaluation findings, risk stratification, comorbidities and participant's personal goals.       Expected Outcomes  Short Term: Increase workloads  from initial exercise prescription for resistance, speed, and METs.;Short Term: Perform resistance training exercises routinely during rehab and add in resistance training at home;Long Term: Improve cardiorespiratory fitness, muscular endurance and strength as measured by increased METs and functional capacity (6MWT)       Able to understand and use rate of perceived exertion (RPE) scale  Yes       Intervention  Provide education and  explanation on how to use RPE scale       Expected Outcomes  Short Term: Able to use RPE daily in rehab to express subjective intensity level;Long Term:  Able to use RPE to guide intensity level when exercising independently       Knowledge and understanding of Target Heart Rate Range (THRR)  Yes       Intervention  Provide education and explanation of THRR including how the numbers were predicted and where they are located for reference       Expected Outcomes  Short Term: Able to state/look up THRR;Long Term: Able to use THRR to govern intensity when exercising independently;Short Term: Able to use daily as guideline for intensity in rehab       Able to check pulse independently  Yes       Intervention  Provide education and demonstration on how to check pulse in carotid and radial arteries.;Review the importance of being able to check your own pulse for safety during independent exercise       Expected Outcomes  Short Term: Able to explain why pulse checking is important during independent exercise;Long Term: Able to check pulse independently and accurately       Understanding of Exercise Prescription  Yes       Intervention  Provide education, explanation, and written materials on patient's individual exercise prescription       Expected Outcomes  Short Term: Able to explain program exercise prescription;Long Term: Able to explain home exercise prescription to exercise independently          Exercise Goals Re-Evaluation : Exercise Goals Re-Evaluation    [place] Name  02/15/19 1622             Exercise Goal Re-Evaluation   Exercise Goals Review  Increase Physical Activity;Increase Strength and Stamina;Able to understand and use rate of perceived exertion (RPE) scale;Knowledge and understanding of Target Heart Rate Range (THRR);Understanding of Exercise Prescription       Comments  Pt first day of CR program. Pt experienced chest discomfort while walking on the treadmill. RN notified. Pt tolerated Nustep well. Pt notified us of CP after exercise was complete.       Expected Outcomes  Will continue to monitor and progress Pt as tolerated.           Discharge Exercise Prescription (Final Exercise Prescription Changes): Exercise Prescription Changes - 02/15/19 1600      Response to Exercise   Blood Pressure (Admit)  108/64    Blood Pressure (Exercise)  104/60    Blood Pressure (Exit)  106/62    Heart Rate (Admit)  71 bpm    Heart Rate (Exercise)  106 bpm    Heart Rate (Exit)  56 bpm    Rating of Perceived Exertion (Exercise)  13    Symptoms  chest discomfort. RN notified    Comments  Pt first day of CR program.     Duration  Continue with 30 min of aerobic exercise without signs/symptoms of physical distress.    Intensity  THRR unchanged      Progression   Progression  Continue to progress workloads to maintain intensity without signs/symptoms of physical distress.    Average METs  2.9      Resistance Training   Training Prescription  Yes    Weight  4 lbs.     Reps  10-15    Time  10 Minutes      Treadmill   MPH  2.6    Grade  1    Minutes  10      NuStep   Level  2    SPM  85    Minutes  15    METs  2.5       Nutrition:  Target Goals: Understanding of nutrition guidelines, daily intake of sodium 1500mg , cholesterol 200mg , calories 30% from fat and 7% or less from saturated fats, daily to have 5 or more servings of fruits and vegetables.  Biometrics: Pre Biometrics - 02/11/19 1455      Pre Biometrics   Height  5\' 11"   (1.803 m)    Weight  155 lb 13.8 oz (70.7 kg)    Waist Circumference  34 inches    Hip Circumference  39 inches    Waist to Hip Ratio  0.87 %    BMI (Calculated)  21.75    Triceps Skinfold  15 mm    % Body Fat  22.7 %    Grip Strength  31 kg    Flexibility  0 in    Single Leg Stand  3.87 seconds        Nutrition Therapy Plan and Nutrition Goals:   Nutrition Assessments:   Nutrition Goals Re-Evaluation:   Nutrition Goals Discharge (Final Nutrition Goals Re-Evaluation):   Psychosocial: Target Goals: Acknowledge presence or absence of significant depression and/or stress, maximize coping skills, provide positive support system. Participant is able to verbalize types and ability to use techniques and skills needed for reducing stress and depression.  Initial Review & Psychosocial Screening: Initial Psych Review & Screening - 02/11/19 1557      Initial Review   Current issues with  Current Stress Concerns    Comments  low stress related to social isolation from COVID-19 pandemic restrictions.      Family Dynamics   Good Support System?  Yes      Barriers   Psychosocial barriers to participate in program  There are no identifiable barriers or psychosocial needs.      Screening Interventions   Interventions  Encouraged to exercise       Quality of Life Scores: Quality of Life - 02/11/19 1456      Quality of Life   Select  Quality of Life      Quality of Life Scores   Health/Function Pre  25.13 %    Socioeconomic Pre  22.29 %    Psych/Spiritual Pre  23.29 %    Family Pre  24.7 %    GLOBAL Pre  24.1 %      Scores of 19 and below usually indicate a poorer quality of life in these areas.  A difference of  2-3 points is a clinically meaningful difference.  A difference of 2-3 points in the total score of the Quality of Life Index has been associated with significant improvement in overall quality of life, self-image, physical symptoms, and general health in studies  assessing change in quality of life.  PHQ-9: Recent Review Flowsheet Data    Depression screen [facility] 2/9 02/11/2019   Decreased Interest 0   Down, Depressed, Hopeless 1    PHQ - 2 Score 1     Interpretation of Total Score  Total Score Depression Severity:  1-4 = Minimal depression, 5-9 = Mild depression, 10-14 = Moderate depression, 15-19 = Moderately severe depression, 20-27 = Severe depression   Psychosocial Evaluation and Intervention:   Psychosocial Re-Evaluation: Psychosocial Re-Evaluation    [place] Name 02/18/19 [phone]  Psychosocial Re-Evaluation   Current issues with  None Identified       Interventions  Encouraged to attend Cardiac Rehabilitation for the exercise       Continue Psychosocial Services   No Follow up required          Psychosocial Discharge (Final Psychosocial Re-Evaluation): Psychosocial Re-Evaluation - 02/18/19 1619      Psychosocial Re-Evaluation   Current issues with  None Identified    Interventions  Encouraged to attend Cardiac Rehabilitation for the exercise    Continue Psychosocial Services   No Follow up required       Vocational Rehabilitation: Provide vocational rehab assistance to qualifying candidates.   Vocational Rehab Evaluation & Intervention: Vocational Rehab - 02/11/19 1601      Initial Vocational Rehab Evaluation & Intervention   Assessment shows need for Vocational Rehabilitation  No       Education: Education Goals: Education classes will be provided on a weekly basis, covering required topics. Participant will state understanding/return demonstration of topics presented.  Learning Barriers/Preferences: Learning Barriers/Preferences - 02/11/19 1457      Learning Barriers/Preferences   Learning Barriers  None    Learning Preferences  Individual Instruction;Skilled Demonstration       Education Topics: Hypertension, Hypertension Reduction -Define heart disease and high blood pressure. Discus how high blood  pressure affects the body and ways to reduce high blood pressure.   Exercise and Your Heart -Discuss why it is important to exercise, the FITT principles of exercise, normal and abnormal responses to exercise, and how to exercise safely.   Angina -Discuss definition of angina, causes of angina, treatment of angina, and how to decrease risk of having angina.   Cardiac Medications -Review what the following cardiac medications are used for, how they affect the body, and side effects that may occur when taking the medications.  Medications include Aspirin, Beta blockers, calcium channel blockers, ACE Inhibitors, angiotensin receptor blockers, diuretics, digoxin, and antihyperlipidemics.   Congestive Heart Failure -Discuss the definition of CHF, how to live with CHF, the signs and symptoms of CHF, and how keep track of weight and sodium intake.   Heart Disease and Intimacy -Discus the effect sexual activity has on the heart, how changes occur during intimacy as we age, and safety during sexual activity.   Smoking Cessation / COPD -Discuss different methods to quit smoking, the health benefits of quitting smoking, and the definition of COPD.   Nutrition I: Fats -Discuss the types of cholesterol, what cholesterol does to the heart, and how cholesterol levels can be controlled.   Nutrition II: Labels -Discuss the different components of food labels and how to read food label   Heart Parts/Heart Disease and PAD -Discuss the anatomy of the heart, the pathway of blood circulation through the heart, and these are affected by heart disease.   Stress I: Signs and Symptoms -Discuss the causes of stress, how stress may lead to anxiety and depression, and ways to limit stress.   Stress II: Relaxation -Discuss different types of relaxation techniques to limit stress.   Warning Signs of Stroke / TIA -Discuss definition of a stroke, what the signs and symptoms are of a stroke, and how to  identify when someone is having stroke.   Knowledge Questionnaire Score: Knowledge Questionnaire Score - 02/11/19 1457      Knowledge Questionnaire Score   Pre Score  21/24       Core Components/Risk Factors/Patient Goals at Admission: Personal Goals and Risk Factors  at Admission - 02/11/19 1458      Core Components/Risk Factors/Patient Goals on Admission    Weight Management  Yes;Weight Maintenance    Admit Weight  155 lb 13.8 oz (70.7 kg)    Lipids  Yes    Intervention  Provide education and support for participant on nutrition & aerobic/resistive exercise along with prescribed medications to achieve LDL 70mg , HDL >40mg .    Expected Outcomes  Short Term: Participant states understanding of desired cholesterol values and is compliant with medications prescribed. Participant is following exercise prescription and nutrition guidelines.;Long Term: Cholesterol controlled with medications as prescribed, with individualized exercise RX and with personalized nutrition plan. Value goals: LDL < 70mg , HDL > 40 mg.       Core Components/Risk Factors/Patient Goals Review:  Goals and Risk Factor Review    Row Name 02/18/19 1619             Core Components/Risk Factors/Patient Goals Review   Personal Goals Review  Weight Management/Obesity;Lipids       Review  [facility] is on hold. Pending clearance from Dr [name]       Expected Outcomes  Patient will continue to participate in phase 2 cardiac rehab for exercise nutrition and lifestyle modifications.          Core Components/Risk Factors/Patient Goals at Discharge (Final Review):  Goals and Risk Factor Review - 02/18/19 1619      Core Components/Risk Factors/Patient Goals Review   Personal Goals Review  Weight Management/Obesity;Lipids    Review  [facility] re[facility] is on hold. Pending clearance from Dr [name]    Expected Outcomes  Patient will continue to participate in phase 2 cardiac rehab for exercise nutrition and  lifestyle modifications.       ITP Comments: ITP Comments    Row Name 02/11/19 1436 02/18/19 1615         ITP Comments  Dr. Fransico Him, Medical Director  30 Day ITP Review. Patient's exercise is currently on hold pending clearnace on return to exercise per Dr [name]         Comments: See ITP comments.Barnet Pall, RN,BSN 02/18/2019 4:24 PM

## 2019-02-19 ENCOUNTER — Other Ambulatory Visit: Payer: Self-pay

## 2019-02-19 ENCOUNTER — Encounter (HOSPITAL_COMMUNITY)
Admission: RE | Admit: 2019-02-19 | Discharge: 2019-02-19 | Disposition: A | Discharge status: Home / Self Care | Payer: Medicare Other | Source: Ambulatory Visit | Attending: Cardiology | Admitting: Cardiology

## 2019-02-19 ENCOUNTER — Telehealth (HOSPITAL_COMMUNITY): Payer: Self-pay | Admitting: *Deleted

## 2019-02-19 ENCOUNTER — Encounter: Payer: Self-pay | Admitting: Cardiology

## 2019-02-19 ENCOUNTER — Ambulatory Visit (INDEPENDENT_AMBULATORY_CARE_PROVIDER_SITE_OTHER): Payer: Medicare Other | Admitting: Cardiology

## 2019-02-19 VITALS — BP 102/58 | HR 49 | Temp 97.0°F | Ht 71.0 in | Wt 152.0 lb

## 2019-02-19 DIAGNOSIS — Z951 Presence of aortocoronary bypass graft: Secondary | ICD-10-CM

## 2019-02-19 DIAGNOSIS — E785 Hyperlipidemia, unspecified: Secondary | ICD-10-CM

## 2019-02-19 DIAGNOSIS — I951 Orthostatic hypotension: Secondary | ICD-10-CM | POA: Diagnosis not present

## 2019-02-19 DIAGNOSIS — Z7901 Long term (current) use of anticoagulants: Secondary | ICD-10-CM | POA: Diagnosis not present

## 2019-02-19 DIAGNOSIS — I2510 Atherosclerotic heart disease of native coronary artery without angina pectoris: Secondary | ICD-10-CM

## 2019-02-19 DIAGNOSIS — Z79899 Other long term (current) drug therapy: Secondary | ICD-10-CM | POA: Diagnosis not present

## 2019-02-19 DIAGNOSIS — I5032 Chronic diastolic (congestive) heart failure: Secondary | ICD-10-CM

## 2019-02-19 DIAGNOSIS — H409 Unspecified glaucoma: Secondary | ICD-10-CM | POA: Diagnosis not present

## 2019-02-19 DIAGNOSIS — Z7982 Long term (current) use of aspirin: Secondary | ICD-10-CM | POA: Diagnosis not present

## 2019-02-19 NOTE — Progress Notes (Signed)
Patient returned to exercise at cardiac rehab this afternoon per Dr [name]. No complaints with exercise or chest pain. Will continue to monitor the patient throughout  the program.Nellene Courtois Venetia Maxon, RN,BSN 02/19/2019 3:14 PM

## 2019-02-19 NOTE — Patient Instructions (Signed)
Medication Instructions:  Your physician recommends that you continue on your current medications as directed. Please refer to the Current Medication list given to you today.  If you need a refill on your cardiac medications before your next appointment, please call your pharmacy.   Lab work: Your provider would like for you to return next week to have the following labs drawn: FASTING lipid and CMET. You do not need an appointment for the lab. Once in our office lobby there is a podium where you can sign in and ring the doorbell to alert [place] that you are here. The lab is open from 8:00 am to 4:30 pm; closed for lunch from 12:45pm-1:45pm.  If you have labs (blood work) drawn today and your tests are completely normal, you will receive your results only by: [place] (if you have MyChart) OR A paper copy in the mail If you have any lab test that is abnormal or we need to change your treatment, we will call you to review the results.  Testing/Procedures: None ordered  Follow-Up: At [facility], you and your health needs are our priority.  As part of our continuing mission to provide you with exceptional heart care, we have created designated Provider Care Teams.  These Care Teams include your primary Cardiologist (physician) and Advanced Practice Providers (APPs -  Physician Assistants and Nurse Practitioners) who all work together to provide you with the care you need, when you need it. You will need a follow up appointment in 6 months.  Please call our office 2 months in advance to schedule this appointment.  You may see Minus Breeding, MD or one of the following Advanced Practice Providers on your designated Care Team:   Rosaria Ferries, PA-C Jory Sims, DNP, ANP

## 2019-02-22 ENCOUNTER — Encounter (HOSPITAL_COMMUNITY)
Admission: RE | Admit: 2019-02-22 | Discharge: 2019-02-22 | Disposition: A | Discharge status: Home / Self Care | Payer: Medicare Other | Source: Ambulatory Visit | Attending: Cardiology | Admitting: Cardiology

## 2019-02-22 ENCOUNTER — Other Ambulatory Visit: Payer: Self-pay

## 2019-02-22 DIAGNOSIS — Z7901 Long term (current) use of anticoagulants: Secondary | ICD-10-CM | POA: Diagnosis not present

## 2019-02-22 DIAGNOSIS — Z7982 Long term (current) use of aspirin: Secondary | ICD-10-CM | POA: Diagnosis not present

## 2019-02-22 DIAGNOSIS — Z951 Presence of aortocoronary bypass graft: Secondary | ICD-10-CM

## 2019-02-22 DIAGNOSIS — Z79899 Other long term (current) drug therapy: Secondary | ICD-10-CM | POA: Diagnosis not present

## 2019-02-22 DIAGNOSIS — H409 Unspecified glaucoma: Secondary | ICD-10-CM | POA: Diagnosis not present

## 2019-02-22 DIAGNOSIS — E785 Hyperlipidemia, unspecified: Secondary | ICD-10-CM | POA: Diagnosis not present

## 2019-02-24 ENCOUNTER — Encounter (HOSPITAL_COMMUNITY)
Admission: RE | Admit: 2019-02-24 | Discharge: 2019-02-24 | Disposition: A | Discharge status: Home / Self Care | Payer: Medicare Other | Source: Ambulatory Visit | Attending: Cardiology | Admitting: Cardiology

## 2019-02-24 ENCOUNTER — Other Ambulatory Visit: Payer: Self-pay

## 2019-02-24 DIAGNOSIS — Z7901 Long term (current) use of anticoagulants: Secondary | ICD-10-CM | POA: Diagnosis not present

## 2019-02-24 DIAGNOSIS — E785 Hyperlipidemia, unspecified: Secondary | ICD-10-CM | POA: Diagnosis not present

## 2019-02-24 DIAGNOSIS — Z951 Presence of aortocoronary bypass graft: Secondary | ICD-10-CM

## 2019-02-24 DIAGNOSIS — H409 Unspecified glaucoma: Secondary | ICD-10-CM | POA: Diagnosis not present

## 2019-02-24 DIAGNOSIS — Z7982 Long term (current) use of aspirin: Secondary | ICD-10-CM | POA: Diagnosis not present

## 2019-02-24 DIAGNOSIS — Z79899 Other long term (current) drug therapy: Secondary | ICD-10-CM | POA: Diagnosis not present

## 2019-02-25 DIAGNOSIS — E785 Hyperlipidemia, unspecified: Secondary | ICD-10-CM | POA: Diagnosis not present

## 2019-02-25 DIAGNOSIS — I5032 Chronic diastolic (congestive) heart failure: Secondary | ICD-10-CM | POA: Diagnosis not present

## 2019-02-25 LAB — COMPREHENSIVE METABOLIC PANEL
ALT: 23 IU/L (ref 0–44)
AST: 26 IU/L (ref 0–40)
Albumin/Globulin Ratio: 2 (ref 1.2–2.2)
Albumin: 4.2 g/dL (ref 3.7–4.7)
Alkaline Phosphatase: 81 IU/L (ref 39–117)
BUN/Creatinine Ratio: 13 (ref 10–24)
BUN: 11 mg/dL (ref 8–27)
Bilirubin Total: 1.6 mg/dL — ABNORMAL HIGH (ref 0.0–1.2)
CO2: 26 mmol/L (ref 20–29)
Calcium: 9.6 mg/dL (ref 8.6–10.2)
Chloride: 103 mmol/L (ref 96–106)
Creatinine, Ser: 0.87 mg/dL (ref 0.76–1.27)
GFR calc Af Amer: 96 mL/min/{1.73_m2} (ref >59)
GFR calc non Af Amer: 83 mL/min/{1.73_m2} (ref >59)
Globulin, Total: 2.1 g/dL (ref 1.5–4.5)
Glucose: 95 mg/dL (ref 65–99)
Potassium: 4.8 mmol/L (ref 3.5–5.2)
Sodium: 141 mmol/L (ref 134–144)
Total Protein: 6.3 g/dL (ref 6.0–8.5)

## 2019-02-25 LAB — LIPID PANEL
Chol/HDL Ratio: 2.1 ratio (ref 0.0–5.0)
Cholesterol, Total: 171 mg/dL (ref 100–199)
HDL: 83 mg/dL (ref >39)
LDL Chol Calc (NIH): 77 mg/dL (ref 0–99)
Triglycerides: 57 mg/dL (ref 0–149)
VLDL Cholesterol Cal: 11 mg/dL (ref 5–40)

## 2019-02-26 ENCOUNTER — Other Ambulatory Visit: Payer: Self-pay

## 2019-02-26 ENCOUNTER — Encounter (HOSPITAL_COMMUNITY)
Admission: RE | Admit: 2019-02-26 | Discharge: 2019-02-26 | Disposition: A | Discharge status: Home / Self Care | Payer: Medicare Other | Source: Ambulatory Visit | Attending: Cardiology | Admitting: Cardiology

## 2019-02-26 DIAGNOSIS — H409 Unspecified glaucoma: Secondary | ICD-10-CM | POA: Diagnosis not present

## 2019-02-26 DIAGNOSIS — Z951 Presence of aortocoronary bypass graft: Secondary | ICD-10-CM

## 2019-02-26 DIAGNOSIS — Z7901 Long term (current) use of anticoagulants: Secondary | ICD-10-CM | POA: Diagnosis not present

## 2019-02-26 DIAGNOSIS — Z7982 Long term (current) use of aspirin: Secondary | ICD-10-CM | POA: Diagnosis not present

## 2019-02-26 DIAGNOSIS — E785 Hyperlipidemia, unspecified: Secondary | ICD-10-CM | POA: Diagnosis not present

## 2019-02-26 DIAGNOSIS — Z79899 Other long term (current) drug therapy: Secondary | ICD-10-CM | POA: Diagnosis not present

## 2019-02-26 IMAGING — CR DG CHEST 2V
2 series · 2 of 2 positions shown · non-contrast
Comparison: 07/19/2018

CLINICAL DATA: Soreness post CABG

EXAM:
CHEST - 2 VIEW

[chest pa]
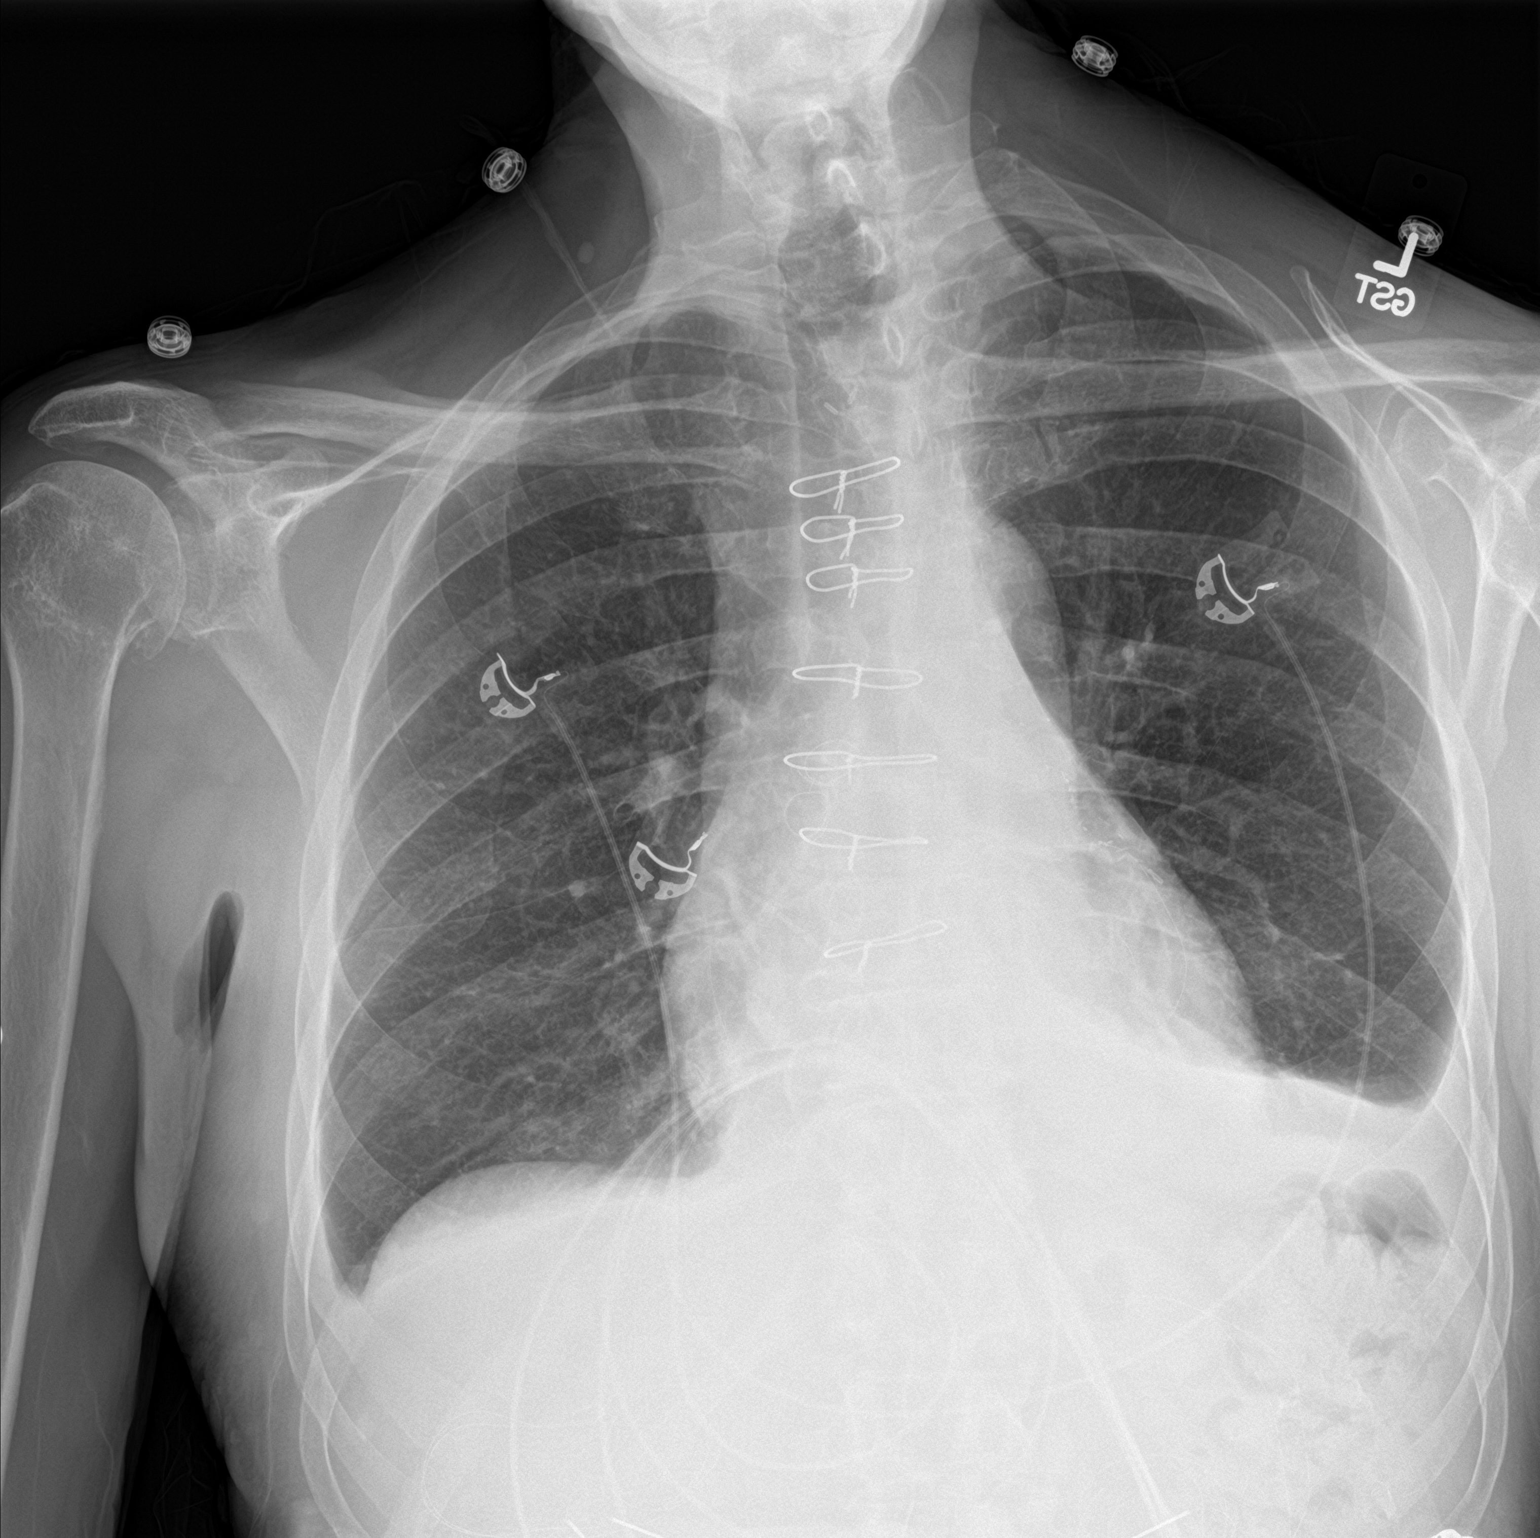

[chest lat]
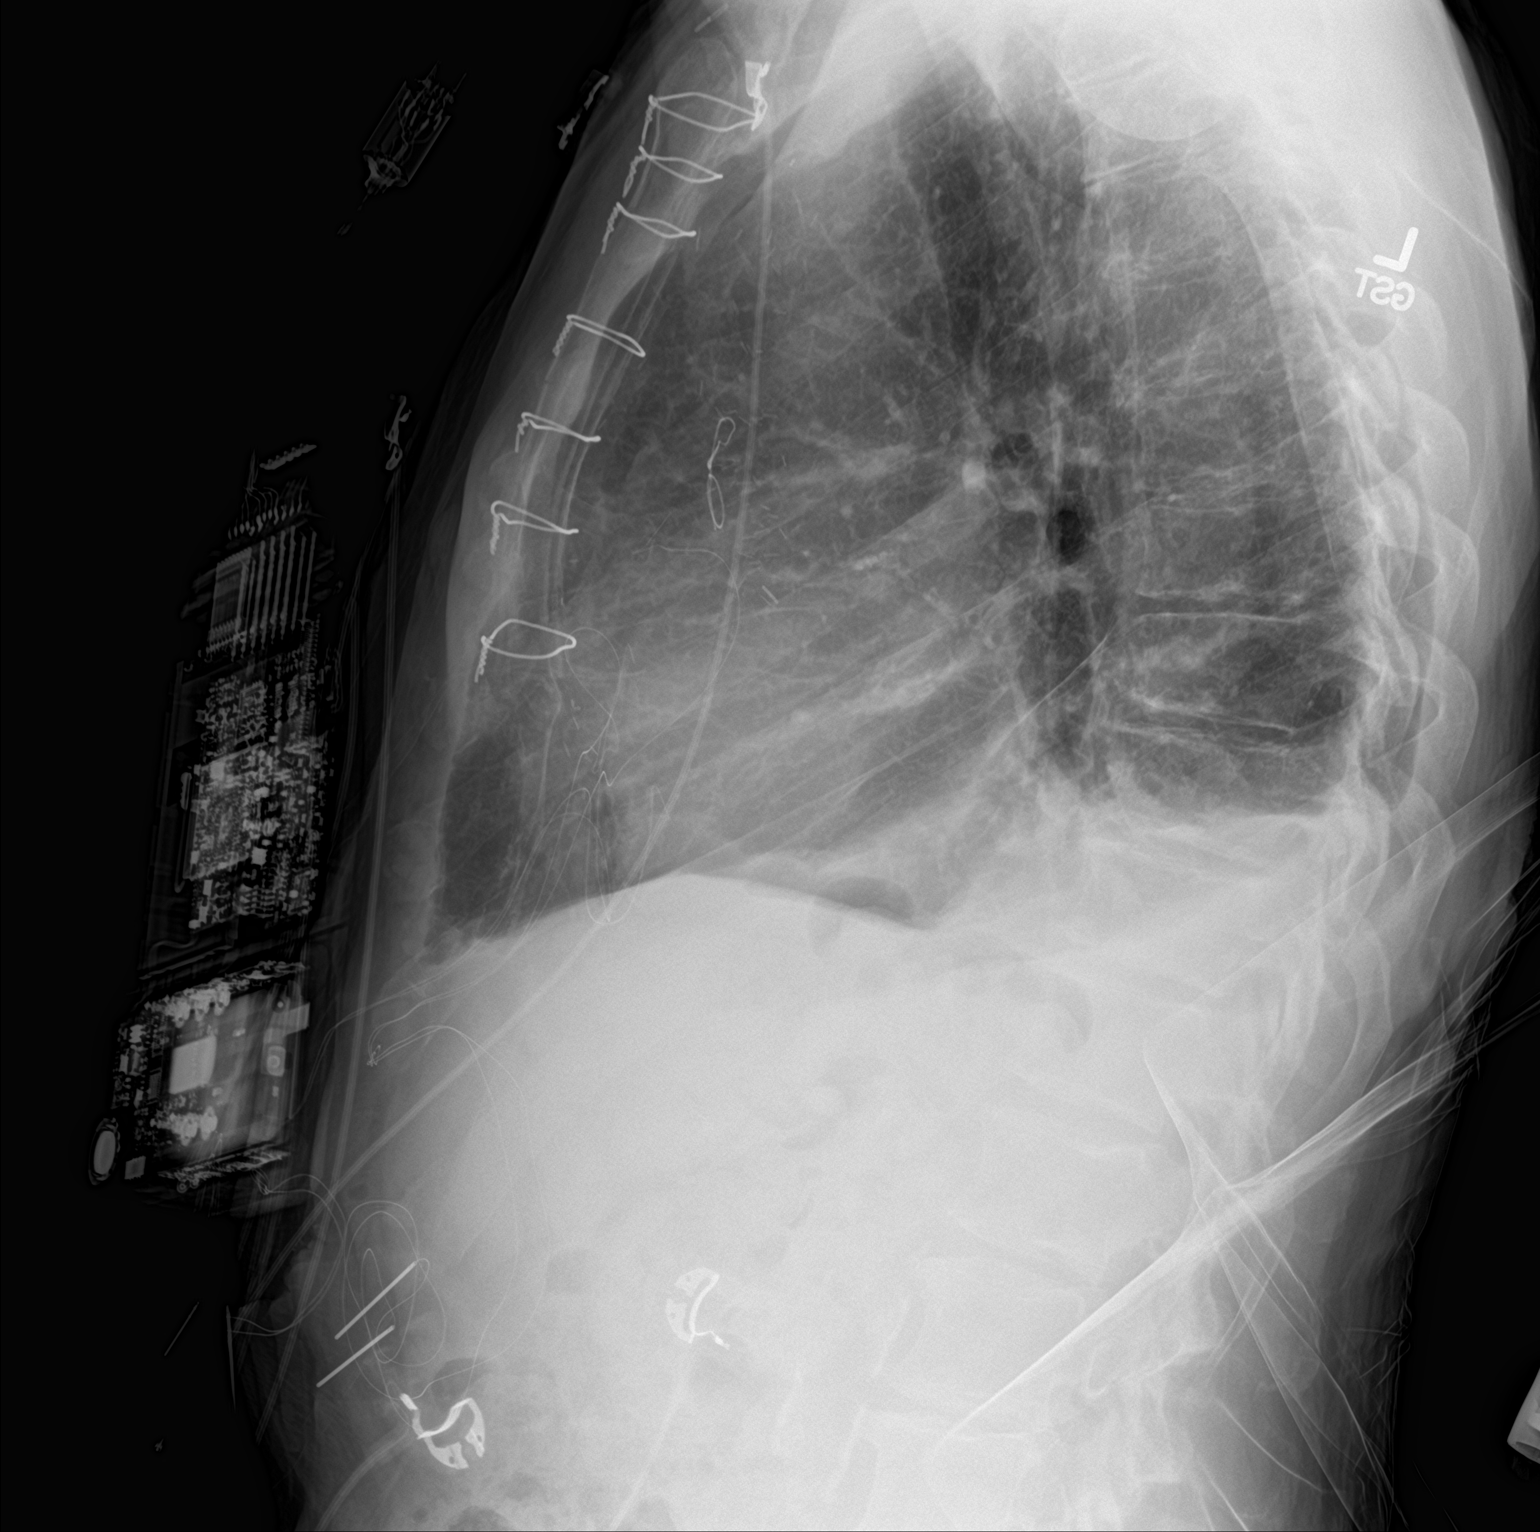

[2 of 2 positions shown; findings below may reference images not displayed]

FINDINGS: Upper normal heart size post CABG.

Epicardial pacing wires present.

Mediastinal contours and pulmonary vascularity normal.

Decrease in small bibasilar effusions and atelectasis, greater on
LEFT.

Remaining lungs clear.

No acute infiltrate or pneumothorax.

Bones unremarkable.
IMPRESSION: Small bibasilar pleural effusions and atelectasis LEFT greater than
RIGHT, slightly improved on LEFT since previous exam.

## 2019-03-01 ENCOUNTER — Encounter (HOSPITAL_COMMUNITY)
Admission: RE | Admit: 2019-03-01 | Discharge: 2019-03-01 | Disposition: A | Discharge status: Home / Self Care | Payer: Medicare Other | Source: Ambulatory Visit | Attending: Cardiology | Admitting: Cardiology

## 2019-03-01 ENCOUNTER — Other Ambulatory Visit: Payer: Self-pay

## 2019-03-01 DIAGNOSIS — E785 Hyperlipidemia, unspecified: Secondary | ICD-10-CM | POA: Diagnosis not present

## 2019-03-01 DIAGNOSIS — H409 Unspecified glaucoma: Secondary | ICD-10-CM | POA: Diagnosis not present

## 2019-03-01 DIAGNOSIS — Z7982 Long term (current) use of aspirin: Secondary | ICD-10-CM | POA: Diagnosis not present

## 2019-03-01 DIAGNOSIS — L820 Inflamed seborrheic keratosis: Secondary | ICD-10-CM | POA: Diagnosis not present

## 2019-03-01 DIAGNOSIS — Z951 Presence of aortocoronary bypass graft: Secondary | ICD-10-CM

## 2019-03-01 DIAGNOSIS — Z7901 Long term (current) use of anticoagulants: Secondary | ICD-10-CM | POA: Diagnosis not present

## 2019-03-01 DIAGNOSIS — Z79899 Other long term (current) drug therapy: Secondary | ICD-10-CM | POA: Diagnosis not present

## 2019-03-03 ENCOUNTER — Other Ambulatory Visit: Payer: Self-pay

## 2019-03-03 ENCOUNTER — Encounter (HOSPITAL_COMMUNITY)
Admission: RE | Admit: 2019-03-03 | Discharge: 2019-03-03 | Disposition: A | Discharge status: Home / Self Care | Payer: Medicare Other | Source: Ambulatory Visit | Attending: Cardiology | Admitting: Cardiology

## 2019-03-03 DIAGNOSIS — Z951 Presence of aortocoronary bypass graft: Secondary | ICD-10-CM

## 2019-03-03 DIAGNOSIS — Z79899 Other long term (current) drug therapy: Secondary | ICD-10-CM | POA: Diagnosis not present

## 2019-03-03 DIAGNOSIS — Z7982 Long term (current) use of aspirin: Secondary | ICD-10-CM | POA: Diagnosis not present

## 2019-03-03 DIAGNOSIS — H409 Unspecified glaucoma: Secondary | ICD-10-CM | POA: Diagnosis not present

## 2019-03-03 DIAGNOSIS — Z7901 Long term (current) use of anticoagulants: Secondary | ICD-10-CM | POA: Diagnosis not present

## 2019-03-03 DIAGNOSIS — E785 Hyperlipidemia, unspecified: Secondary | ICD-10-CM | POA: Diagnosis not present

## 2019-03-03 NOTE — Progress Notes (Signed)
I have reviewed a Home Exercise Prescription with Brian Terry . Brian Terry is currently exercising at home by walking 30 minutes.  The patient was advised to walk 5-7 days a week for 30-45 minutes.  Eddie Dibbles and I discussed how to progress their exercise prescription.  The patient stated that their goals were to continue walking 30-45 minutes on the days he does not come to CR.  The patient stated that they understand the exercise prescription.  We reviewed exercise guidelines, target heart rate during exercise, RPE Scale, weather conditions, NTG use, endpoints for exercise, warmup and cool down.  Patient is encouraged to come to me with any questions. I will continue to follow up with the patient to assist them with progression and safety.    Deitra Mayo BS, ACSM CEP 03/03/2019 3:21 PM

## 2019-03-05 ENCOUNTER — Encounter (HOSPITAL_COMMUNITY)
Admission: RE | Admit: 2019-03-05 | Discharge: 2019-03-05 | Disposition: A | Discharge status: Home / Self Care | Payer: Medicare Other | Source: Ambulatory Visit | Attending: Cardiology | Admitting: Cardiology

## 2019-03-05 ENCOUNTER — Other Ambulatory Visit: Payer: Self-pay

## 2019-03-05 DIAGNOSIS — Z79899 Other long term (current) drug therapy: Secondary | ICD-10-CM | POA: Diagnosis not present

## 2019-03-05 DIAGNOSIS — E785 Hyperlipidemia, unspecified: Secondary | ICD-10-CM | POA: Diagnosis not present

## 2019-03-05 DIAGNOSIS — H409 Unspecified glaucoma: Secondary | ICD-10-CM | POA: Diagnosis not present

## 2019-03-05 DIAGNOSIS — Z7982 Long term (current) use of aspirin: Secondary | ICD-10-CM | POA: Insufficient documentation

## 2019-03-05 DIAGNOSIS — Z7901 Long term (current) use of anticoagulants: Secondary | ICD-10-CM | POA: Insufficient documentation

## 2019-03-05 DIAGNOSIS — Z951 Presence of aortocoronary bypass graft: Secondary | ICD-10-CM

## 2019-03-08 ENCOUNTER — Encounter (HOSPITAL_COMMUNITY)
Admission: RE | Admit: 2019-03-08 | Discharge: 2019-03-08 | Disposition: A | Discharge status: Home / Self Care | Payer: Medicare Other | Source: Ambulatory Visit | Attending: Cardiology | Admitting: Cardiology

## 2019-03-08 ENCOUNTER — Other Ambulatory Visit: Payer: Self-pay

## 2019-03-08 DIAGNOSIS — Z7982 Long term (current) use of aspirin: Secondary | ICD-10-CM | POA: Diagnosis not present

## 2019-03-08 DIAGNOSIS — Z7901 Long term (current) use of anticoagulants: Secondary | ICD-10-CM | POA: Diagnosis not present

## 2019-03-08 DIAGNOSIS — E785 Hyperlipidemia, unspecified: Secondary | ICD-10-CM | POA: Diagnosis not present

## 2019-03-08 DIAGNOSIS — Z951 Presence of aortocoronary bypass graft: Secondary | ICD-10-CM | POA: Diagnosis not present

## 2019-03-08 DIAGNOSIS — Z79899 Other long term (current) drug therapy: Secondary | ICD-10-CM | POA: Diagnosis not present

## 2019-03-08 DIAGNOSIS — H409 Unspecified glaucoma: Secondary | ICD-10-CM | POA: Diagnosis not present

## 2019-03-10 ENCOUNTER — Encounter (HOSPITAL_COMMUNITY)
Admission: RE | Admit: 2019-03-10 | Discharge: 2019-03-10 | Disposition: A | Discharge status: Home / Self Care | Payer: Medicare Other | Source: Ambulatory Visit | Attending: Cardiology | Admitting: Cardiology

## 2019-03-10 ENCOUNTER — Other Ambulatory Visit: Payer: Self-pay

## 2019-03-10 DIAGNOSIS — Z7901 Long term (current) use of anticoagulants: Secondary | ICD-10-CM | POA: Diagnosis not present

## 2019-03-10 DIAGNOSIS — Z951 Presence of aortocoronary bypass graft: Secondary | ICD-10-CM

## 2019-03-10 DIAGNOSIS — H409 Unspecified glaucoma: Secondary | ICD-10-CM | POA: Diagnosis not present

## 2019-03-10 DIAGNOSIS — Z79899 Other long term (current) drug therapy: Secondary | ICD-10-CM | POA: Diagnosis not present

## 2019-03-10 DIAGNOSIS — E785 Hyperlipidemia, unspecified: Secondary | ICD-10-CM | POA: Diagnosis not present

## 2019-03-10 DIAGNOSIS — Z7982 Long term (current) use of aspirin: Secondary | ICD-10-CM | POA: Diagnosis not present

## 2019-03-12 ENCOUNTER — Other Ambulatory Visit: Payer: Self-pay

## 2019-03-12 ENCOUNTER — Encounter (HOSPITAL_COMMUNITY)
Admission: RE | Admit: 2019-03-12 | Discharge: 2019-03-12 | Disposition: A | Discharge status: Home / Self Care | Payer: Medicare Other | Source: Ambulatory Visit | Attending: Cardiology | Admitting: Cardiology

## 2019-03-12 DIAGNOSIS — Z7982 Long term (current) use of aspirin: Secondary | ICD-10-CM | POA: Diagnosis not present

## 2019-03-12 DIAGNOSIS — E785 Hyperlipidemia, unspecified: Secondary | ICD-10-CM | POA: Diagnosis not present

## 2019-03-12 DIAGNOSIS — H409 Unspecified glaucoma: Secondary | ICD-10-CM | POA: Diagnosis not present

## 2019-03-12 DIAGNOSIS — Z951 Presence of aortocoronary bypass graft: Secondary | ICD-10-CM | POA: Diagnosis not present

## 2019-03-12 DIAGNOSIS — Z79899 Other long term (current) drug therapy: Secondary | ICD-10-CM | POA: Diagnosis not present

## 2019-03-12 DIAGNOSIS — Z7901 Long term (current) use of anticoagulants: Secondary | ICD-10-CM | POA: Diagnosis not present

## 2019-03-15 ENCOUNTER — Encounter (HOSPITAL_COMMUNITY)
Admission: RE | Admit: 2019-03-15 | Discharge: 2019-03-15 | Disposition: A | Discharge status: Home / Self Care | Payer: Medicare Other | Source: Ambulatory Visit | Attending: Cardiology | Admitting: Cardiology

## 2019-03-15 ENCOUNTER — Other Ambulatory Visit: Payer: Self-pay

## 2019-03-15 DIAGNOSIS — Z79899 Other long term (current) drug therapy: Secondary | ICD-10-CM | POA: Diagnosis not present

## 2019-03-15 DIAGNOSIS — Z7982 Long term (current) use of aspirin: Secondary | ICD-10-CM | POA: Diagnosis not present

## 2019-03-15 DIAGNOSIS — Z951 Presence of aortocoronary bypass graft: Secondary | ICD-10-CM | POA: Diagnosis not present

## 2019-03-15 DIAGNOSIS — Z7901 Long term (current) use of anticoagulants: Secondary | ICD-10-CM | POA: Diagnosis not present

## 2019-03-15 DIAGNOSIS — E785 Hyperlipidemia, unspecified: Secondary | ICD-10-CM | POA: Diagnosis not present

## 2019-03-15 DIAGNOSIS — H409 Unspecified glaucoma: Secondary | ICD-10-CM | POA: Diagnosis not present

## 2019-03-17 ENCOUNTER — Other Ambulatory Visit: Payer: Self-pay

## 2019-03-17 ENCOUNTER — Encounter (HOSPITAL_COMMUNITY)
Admission: RE | Admit: 2019-03-17 | Discharge: 2019-03-17 | Disposition: A | Discharge status: Home / Self Care | Payer: Medicare Other | Source: Ambulatory Visit | Attending: Cardiology | Admitting: Cardiology

## 2019-03-17 VITALS — Ht 71.0 in | Wt 155.9 lb

## 2019-03-17 DIAGNOSIS — Z79899 Other long term (current) drug therapy: Secondary | ICD-10-CM | POA: Diagnosis not present

## 2019-03-17 DIAGNOSIS — H409 Unspecified glaucoma: Secondary | ICD-10-CM | POA: Diagnosis not present

## 2019-03-17 DIAGNOSIS — E785 Hyperlipidemia, unspecified: Secondary | ICD-10-CM | POA: Diagnosis not present

## 2019-03-17 DIAGNOSIS — Z951 Presence of aortocoronary bypass graft: Secondary | ICD-10-CM

## 2019-03-17 DIAGNOSIS — Z7982 Long term (current) use of aspirin: Secondary | ICD-10-CM | POA: Diagnosis not present

## 2019-03-17 DIAGNOSIS — Z7901 Long term (current) use of anticoagulants: Secondary | ICD-10-CM | POA: Diagnosis not present

## 2019-03-18 NOTE — Progress Notes (Signed)
Cardiac Individual Treatment Plan  Patient Details  Name: Brian Terry MRN: [id number] Date of Birth: 09-Oct-1940 Referring Provider:     CARDIAC REHAB [name] from 02/11/2019 in [facility]  Referring Provider  Dr. [name]       Initial Encounter Date:    CARDIAC REHAB PHASE II ORIENTATION from 02/11/2019 in [facility]  Date  02/11/19      Visit Diagnosis: S/P CABG x 3  Patient's Home Medications on Admission:  Current Outpatient Medications:  .  acetaminophen (TYLENOL) 650 MG CR tablet, Take 650 mg by mouth daily. , Disp: , Rfl:  .  Ascorbic Acid (VITAMIN C WITH ROSE HIPS) 1000 MG tablet, Take 1,000 mg by mouth daily., Disp: , Rfl:  .  aspirin EC 81 MG tablet, Take 81 mg by mouth daily., Disp: , Rfl:  .  carbamide peroxide (DEBROX) 6.5 % OTIC solution, Place 5 drops into the right ear daily as needed (for earwax build up)., Disp: , Rfl:  .  furosemide (LASIX) 20 MG tablet, Take 20 mg by mouth daily. Only for 3 days with fluid build up in left ankle, Disp: , Rfl:  .  midodrine (PROAMATINE) 10 MG tablet, TAKE  (1)  TABLET  THREE TIMES DAILY AFTER MEALS., Disp: 90 tablet, Rfl: 5 .  Multiple Vitamin (MULTIVITAMIN WITH MINERALS) TABS tablet, Take 1 tablet by mouth daily. , Disp: , Rfl:  .  nitroGLYCERIN (NITROSTAT) 0.4 MG SL tablet, Place 1 tablet (0.4 mg total) under the tongue every 5 (five) minutes as needed for chest pain., Disp: 30 tablet, Rfl: prn .  Psyllium (METAMUCIL PO), Take 2-4 capsules by mouth daily. , Disp: , Rfl:  .  rosuvastatin (CRESTOR) 20 MG tablet, Take 20 mg by mouth daily after supper., Disp: , Rfl:  .  tamsulosin (FLOMAX) 0.4 MG CAPS capsule, Take 0.4 mg by mouth daily after supper. 30 minutes after supper., Disp: , Rfl:  .  timolol (TIMOPTIC) 0.5 % ophthalmic solution, Place 1 drop into both eyes 2 (two) times daily. , Disp: , Rfl: 77  Past Medical History: Past Medical History:   Diagnosis Date  . GERD (gastroesophageal reflux disease)   . Glaucoma   . Hemorrhoids   . Hyperlipidemia     Tobacco Use: Social History   Tobacco Use  Smoking Status Never Smoker  Smokeless Tobacco Never Used    Labs: Recent Review Flowsheet Data    Labs for ITP Cardiac and Pulmonary Rehab Latest Ref Rng & Units 07/16/2018 07/16/2018 07/16/2018 07/16/2018 02/25/2019   Cholestrol 100 - 199 mg/dL - - - - 171   LDLCALC 0 - 99 mg/dL - - - - 77   LDLDIRECT mg/dL - - - - -   HDL >39 mg/dL - - - - 83   Trlycerides 0 - 149 mg/dL - - - - 57   Hemoglobin A1c 4.8 - 5.6 % - - - - -   PHART 7.350 - 7.450 7.463(H) 7.483(H) 7.352 7.335(L) -   PCO2ART 32.0 - 48.0 mmHg 35.3 31.6(L) 44.6 42.8 -   HCO3 20.0 - 28.0 mmol/L 25.3 24.1 24.7 22.7 -   TCO2 22 - 32 mmol/L '26 25 26 24 '$ -   ACIDBASEDEF 0.0 - 2.0 mmol/L - - 1.0 3.0(H) -   O2SAT % 100.0 99.0 99.0 98.0 -      Capillary Blood Glucose: Lab Results  Component Value Date   GLUCAP 90 07/17/2018  GLUCAP 59 (L) 07/17/2018   GLUCAP <10 (LL) 07/17/2018   GLUCAP 111 (H) 07/17/2018   GLUCAP 118 (H) 07/17/2018     Exercise Target Goals: Exercise Program Goal: Individual exercise prescription set using results from initial 6 min walk test and THRR while considering  patient's activity barriers and safety.   Exercise Prescription Goal: Starting with aerobic activity 30 plus minutes a day, 3 days per week for initial exercise prescription. Provide home exercise prescription and guidelines that participant acknowledges understanding prior to discharge.  Activity Barriers & Risk Stratification: Activity Barriers & Cardiac Risk Stratification - 02/11/19 1451      Activity Barriers & Cardiac Risk Stratification   Activity Barriers  None    Cardiac Risk Stratification  High       6 Minute Walk: 6 Minute Walk    Row Name 02/11/19 1450 03/17/19 1509       6 Minute Walk   Phase  Initial  Discharge    Distance  1676 feet  2078 feet     Distance % Change  -  23.99 %    Distance Feet Change  -  402 ft    Walk Time  6 minutes  6 minutes    # of Rest Breaks  0  0    MPH  3.1  3.9    METS  3.6  4.7    RPE  10  13    Perceived Dyspnea   0  0    VO2 Peak  12.6  16.59    Symptoms  No  No    Resting HR  60 bpm  63 bpm    Resting BP  118/70  120/62    Resting Oxygen Saturation   98 %  -    Exercise Oxygen Saturation  during 6 min walk  98 %  -    Max Ex. HR  98 bpm  124 bpm    Max Ex. BP  132/76  152/78    2 Minute Post BP  104/68  102/60       Oxygen Initial Assessment:   Oxygen Re-Evaluation:   Oxygen Discharge (Final Oxygen Re-Evaluation):   Initial Exercise Prescription: Initial Exercise Prescription - 02/11/19 1400      Date of Initial Exercise RX and Referring Provider   Date  02/11/19    Referring Provider  Dr. [name]     Expected Discharge Date  03/26/19      Treadmill   MPH  2.6    Grade  1    Minutes  15      NuStep   Level  3    SPM  85    Minutes  15    METs  2.9      Prescription Details   Frequency (times per week)  3    Duration  Progress to 30 minutes of continuous aerobic without signs/symptoms of physical distress      Intensity   THRR 40-80% of Max Heartrate  57-114    Ratings of Perceived Exertion  11-13      Progression   Progression  Continue to progress workloads to maintain intensity without signs/symptoms of physical distress.      Resistance Training   Training Prescription  Yes    Weight  4 lbs.     Reps  10-15       Perform Capillary Blood Glucose checks as needed.  Exercise Prescription Changes: Exercise Prescription Changes    Row  Name 02/15/19 1600 03/03/19 1500 03/15/19 1345         Response to Exercise   Blood Pressure (Admit)  108/64  104/60  108/50     Blood Pressure (Exercise)  104/60  98/70  120/64     Blood Pressure (Exit)  106/62  98/64  96/60     Heart Rate (Admit)  71 bpm  73 bpm  80 bpm     Heart Rate (Exercise)  106 bpm  91 bpm  87 bpm      Heart Rate (Exit)  56 bpm  66 bpm  52 bpm     Rating of Perceived Exertion (Exercise)  '13  13  13     '$ Symptoms  chest discomfort. RN notified  none  none     Comments  Pt first day of CR program.   -  -     Duration  Continue with 30 min of aerobic exercise without signs/symptoms of physical distress.  Continue with 30 min of aerobic exercise without signs/symptoms of physical distress.  Continue with 30 min of aerobic exercise without signs/symptoms of physical distress.     Intensity  THRR unchanged  THRR unchanged  THRR unchanged       Progression   Progression  Continue to progress workloads to maintain intensity without signs/symptoms of physical distress.  Continue to progress workloads to maintain intensity without signs/symptoms of physical distress.  Continue to progress workloads to maintain intensity without signs/symptoms of physical distress.     Average METs  2.9  3  3.8       Resistance Training   Training Prescription  Yes  No  Yes     Weight  4 lbs.   -  4 lbs.      Reps  10-15  -  10-15     Time  10 Minutes  -  10 Minutes       Interval Training   Interval Training  -  No  No       Treadmill   MPH  2.6  -  -     Grade  1  -  -     Minutes  10  -  -       Recumbant Bike   Level  -  -  2     Watts  -  -  60     Minutes  -  -  15     METs  -  -  4.7       NuStep   Level  '2  4  4     '$ SPM  85  85  85     Minutes  '15  15  15     '$ METs  2.'5  3  3       '$ Arm Ergometer   Level  -  3  -     Minutes  -  15  -     METs  -  3  -       Home Exercise Plan   Plans to continue exercise at  -  Home (comment)  Home (comment)     Frequency  -  Add 4 additional days to program exercise sessions.  Add 4 additional days to program exercise sessions.     Initial Home Exercises Provided  -  03/03/19  03/03/19        Exercise Comments: Exercise Comments    Row Name 02/15/19  1624 03/03/19 1527 03/18/19 1116       Exercise Comments  Pt first day of CR program.  Reviewed  HEP with Pt. Pt expressed understanding of goals.  Reviewed METs and goals with Pt. Pt is progressing well.        Exercise Goals and Review: Exercise Goals    Row Name 02/11/19 1455             Exercise Goals   Increase Physical Activity  Yes       Intervention  Provide advice, education, support and counseling about physical activity/exercise needs.;Develop an individualized exercise prescription for aerobic and resistive training based on initial evaluation findings, risk stratification, comorbidities and participant's personal goals.       Expected Outcomes  Short Term: Attend rehab on a regular basis to increase amount of physical activity.;Long Term: Add in home exercise to make exercise part of routine and to increase amount of physical activity.;Long Term: Exercising regularly at least 3-5 days a week.       Increase Strength and Stamina  Yes       Intervention  Provide advice, education, support and counseling about physical activity/exercise needs.;Develop an individualized exercise prescription for aerobic and resistive training based on initial evaluation findings, risk stratification, comorbidities and participant's personal goals.       Expected Outcomes  Short Term: Increase workloads from initial exercise prescription for resistance, speed, and METs.;Short Term: Perform resistance training exercises routinely during rehab and add in resistance training at home;Long Term: Improve cardiorespiratory fitness, muscular endurance and strength as measured by increased METs and functional capacity (6MWT)       Able to understand and use rate of perceived exertion (RPE) scale  Yes       Intervention  Provide education and explanation on how to use RPE scale       Expected Outcomes  Short Term: Able to use RPE daily in rehab to express subjective intensity level;Long Term:  Able to use RPE to guide intensity level when exercising independently       Knowledge and understanding of Target  Heart Rate Range (THRR)  Yes       Intervention  Provide education and explanation of THRR including how the numbers were predicted and where they are located for reference       Expected Outcomes  Short Term: Able to state/look up THRR;Long Term: Able to use THRR to govern intensity when exercising independently;Short Term: Able to use daily as guideline for intensity in rehab       Able to check pulse independently  Yes       Intervention  Provide education and demonstration on how to check pulse in carotid and radial arteries.;Review the importance of being able to check your own pulse for safety during independent exercise       Expected Outcomes  Short Term: Able to explain why pulse checking is important during independent exercise;Long Term: Able to check pulse independently and accurately       Understanding of Exercise Prescription  Yes       Intervention  Provide education, explanation, and written materials on patient's individual exercise prescription       Expected Outcomes  Short Term: Able to explain program exercise prescription;Long Term: Able to explain home exercise prescription to exercise independently          Exercise Goals Re-Evaluation : Exercise Goals Re-Evaluation    Row Name 02/15/19 1622 03/03/19 1524 03/17/19 1345  Exercise Goal Re-Evaluation   Exercise Goals Review  Increase Physical Activity;Increase Strength and Stamina;Able to understand and use rate of perceived exertion (RPE) scale;Knowledge and understanding of Target Heart Rate Range (THRR);Understanding of Exercise Prescription  Increase Physical Activity;Increase Strength and Stamina;Able to understand and use rate of perceived exertion (RPE) scale;Knowledge and understanding of Target Heart Rate Range (THRR);Able to check pulse independently;Understanding of Exercise Prescription  Increase Physical Activity;Increase Strength and Stamina;Able to understand and use rate of perceived exertion (RPE)  scale;Knowledge and understanding of Target Heart Rate Range (THRR);Able to check pulse independently;Understanding of Exercise Prescription     Comments  Pt first day of CR program. Pt experienced chest discomfort while walking on the treadmill. RN notified. Pt tolerated Nustep well. Pt notified us of CP after exercise was complete.  Reviewed HEP with Pt. Pt expressed understanding of goals, THRR, RPE scale, weather precautions, end points of exercise, warm up and cool down stretches, NTG use, and exercise Rx. Pt is currently walking at home for 30 minutes in addition to CR program.  Reviewed HEP with Pt. Pt is progressing well and has a MET level of 3.8. Pt continues to increase his MET levels and workloads each session. Pt continues to walk at home 2-4 days per week for 30-45 minutes in addition to CR program.     Expected Outcomes  Will continue to monitor and progress Pt as tolerated.  Will continue to monitor and progress Pt as tolerated.  Will continue to monitor and progress Pt as tolerated.         Discharge Exercise Prescription (Final Exercise Prescription Changes): Exercise Prescription Changes - 03/15/19 1345      Response to Exercise   Blood Pressure (Admit)  108/50    Blood Pressure (Exercise)  120/64    Blood Pressure (Exit)  96/60    Heart Rate (Admit)  80 bpm    Heart Rate (Exercise)  87 bpm    Heart Rate (Exit)  52 bpm    Rating of Perceived Exertion (Exercise)  13    Symptoms  none    Duration  Continue with 30 min of aerobic exercise without signs/symptoms of physical distress.    Intensity  THRR unchanged      Progression   Progression  Continue to progress workloads to maintain intensity without signs/symptoms of physical distress.    Average METs  3.8      Resistance Training   Training Prescription  Yes    Weight  4 lbs.     Reps  10-15    Time  10 Minutes      Interval Training   Interval Training  No      Recumbant Bike   Level  2    Watts  60     Minutes  15    METs  4.7      NuStep   Level  4    SPM  85    Minutes  15    METs  3      Home Exercise Plan   Plans to continue exercise at  Home (comment)    Frequency  Add 4 additional days to program exercise sessions.    Initial Home Exercises Provided  03/03/19       Nutrition:  Target Goals: Understanding of nutrition guidelines, daily intake of sodium '1500mg'$ , cholesterol '200mg'$ , calories 30% from fat and 7% or less from saturated fats, daily to have 5 or more servings of fruits and vegetables.  Biometrics: Pre Biometrics - 02/11/19 1455      Pre Biometrics   Height  '5\' 11"'$  (1.803 m)    Weight  70.7 kg    Waist Circumference  34 inches    Hip Circumference  39 inches    Waist to Hip Ratio  0.87 %    BMI (Calculated)  21.75    Triceps Skinfold  15 mm    % Body Fat  22.7 %    Grip Strength  31 kg    Flexibility  0 in    Single Leg Stand  3.87 seconds      Post Biometrics - 03/17/19 1510       Post  Biometrics   Height  '5\' 11"'$  (1.803 m)    Weight  70.7 kg    Waist Circumference  33 inches    Hip Circumference  38.5 inches    Waist to Hip Ratio  0.86 %    BMI (Calculated)  21.75    Triceps Skinfold  15 mm    % Body Fat  22.2 %    Grip Strength  33 kg    Flexibility  9.5 in    Single Leg Stand  6.81 seconds       Nutrition Therapy Plan and Nutrition Goals:   Nutrition Assessments:   Nutrition Goals Re-Evaluation:   Nutrition Goals Discharge (Final Nutrition Goals Re-Evaluation):   Psychosocial: Target Goals: Acknowledge presence or absence of significant depression and/or stress, maximize coping skills, provide positive support system. Participant is able to verbalize types and ability to use techniques and skills needed for reducing stress and depression.  Initial Review & Psychosocial Screening: Initial Psych Review & Screening - 02/11/19 1557      Initial Review   Current issues with  Current Stress Concerns    Comments  low stress  related to social isolation from COVID-19 pandemic restrictions.      Family Dynamics   Good Support System?  Yes      Barriers   Psychosocial barriers to participate in program  There are no identifiable barriers or psychosocial needs.      Screening Interventions   Interventions  Encouraged to exercise       Quality of Life Scores: Quality of Life - 02/11/19 1456      Quality of Life   Select  Quality of Life      Quality of Life Scores   Health/Function Pre  25.13 %    Socioeconomic Pre  22.29 %    Psych/Spiritual Pre  23.29 %    Family Pre  24.7 %    GLOBAL Pre  24.1 %      Scores of 19 and below usually indicate a poorer quality of life in these areas.  A difference of  2-3 points is a clinically meaningful difference.  A difference of 2-3 points in the total score of the Quality of Life Index has been associated with significant improvement in overall quality of life, self-image, physical symptoms, and general health in studies assessing change in quality of life.  PHQ-9: Recent Review Flowsheet Data    Depression screen [facility] 2/9 02/11/2019   Decreased Interest 0   Down, Depressed, Hopeless 1    PHQ - 2 Score 1     Interpretation of Total Score  Total Score Depression Severity:  1-4 = Minimal depression, 5-9 = Mild depression, 10-14 = Moderate depression, 15-19 = Moderately severe depression, 20-27 = Severe depression   Psychosocial  Evaluation and Intervention:   Psychosocial Re-Evaluation: Psychosocial Re-Evaluation    [place] Name 02/18/19 1619 03/17/19 1656           Psychosocial Re-Evaluation   Current issues with  None Identified  None Identified      Comments  -  Patient continues to utilize support from family and friends. Maintains a positive attitude and outlook.      Expected Outcomes  -  Patient will continue to maintain a positive outlook and utilize support system including family, friends, and health care team if psychosocial needs arise.       Interventions  Encouraged to attend Cardiac Rehabilitation for the exercise  Encouraged to attend Cardiac Rehabilitation for the exercise      Continue Psychosocial Services   No Follow up required  No Follow up required         Psychosocial Discharge (Final Psychosocial Re-Evaluation): Psychosocial Re-Evaluation - 03/17/19 1656      Psychosocial Re-Evaluation   Current issues with  None Identified    Comments  Patient continues to utilize support from family and friends. Maintains a positive attitude and outlook.    Expected Outcomes  Patient will continue to maintain a positive outlook and utilize support system including family, friends, and health care team if psychosocial needs arise.    Interventions  Encouraged to attend Cardiac Rehabilitation for the exercise    Continue Psychosocial Services   No Follow up required       Vocational Rehabilitation: Provide vocational rehab assistance to qualifying candidates.   Vocational Rehab Evaluation & Intervention: Vocational Rehab - 02/11/19 1601      Initial Vocational Rehab Evaluation & Intervention   Assessment shows need for Vocational Rehabilitation  No       Education: Education Goals: Education classes will be provided on a weekly basis, covering required topics. Participant will state understanding/return demonstration of topics presented.  Learning Barriers/Preferences: Learning Barriers/Preferences - 02/11/19 1457      Learning Barriers/Preferences   Learning Barriers  None    Learning Preferences  Individual Instruction;Skilled Demonstration       Education Topics: Hypertension, Hypertension Reduction -Define heart disease and high blood pressure. Discus how high blood pressure affects the body and ways to reduce high blood pressure.   Exercise and Your Heart -Discuss why it is important to exercise, the FITT principles of exercise, normal and abnormal responses to exercise, and how to exercise safely.    Angina -Discuss definition of angina, causes of angina, treatment of angina, and how to decrease risk of having angina.   Cardiac Medications -Review what the following cardiac medications are used for, how they affect the body, and side effects that may occur when taking the medications.  Medications include Aspirin, Beta blockers, calcium channel blockers, ACE Inhibitors, angiotensin receptor blockers, diuretics, digoxin, and antihyperlipidemics.   Congestive Heart Failure -Discuss the definition of CHF, how to live with CHF, the signs and symptoms of CHF, and how keep track of weight and sodium intake.   Heart Disease and Intimacy -Discus the effect sexual activity has on the heart, how changes occur during intimacy as we age, and safety during sexual activity.   Smoking Cessation / COPD -Discuss different methods to quit smoking, the health benefits of quitting smoking, and the definition of COPD.   Nutrition I: Fats -Discuss the types of cholesterol, what cholesterol does to the heart, and how cholesterol levels can be controlled.   Nutrition II: Labels -Discuss the different components of  food labels and how to read food label   Heart Parts/Heart Disease and PAD -Discuss the anatomy of the heart, the pathway of blood circulation through the heart, and these are affected by heart disease.   Stress I: Signs and Symptoms -Discuss the causes of stress, how stress may lead to anxiety and depression, and ways to limit stress.   Stress II: Relaxation -Discuss different types of relaxation techniques to limit stress.   Warning Signs of Stroke / TIA -Discuss definition of a stroke, what the signs and symptoms are of a stroke, and how to identify when someone is having stroke.   Knowledge Questionnaire Score: Knowledge Questionnaire Score - 02/11/19 1457      Knowledge Questionnaire Score   Pre Score  21/24       Core Components/Risk Factors/Patient Goals at  Admission: Personal Goals and Risk Factors at Admission - 02/11/19 1458      Core Components/Risk Factors/Patient Goals on Admission    Weight Management  Yes;Weight Maintenance    Admit Weight  155 lb 13.8 oz (70.7 kg)    Lipids  Yes    Intervention  Provide education and support for participant on nutrition & aerobic/resistive exercise along with prescribed medications to achieve LDL '70mg'$ , HDL >'40mg'$ .    Expected Outcomes  Short Term: Participant states understanding of desired cholesterol values and is compliant with medications prescribed. Participant is following exercise prescription and nutrition guidelines.;Long Term: Cholesterol controlled with medications as prescribed, with individualized exercise RX and with personalized nutrition plan. Value goals: LDL < '70mg'$ , HDL > 40 mg.       Core Components/Risk Factors/Patient Goals Review:  Goals and Risk Factor Review    Row Name 02/18/19 1619 03/17/19 1658           Core Components/Risk Factors/Patient Goals Review   Personal Goals Review  Weight Management/Obesity;Lipids  Weight Management/Obesity;Lipids      Review  [facility] is on hold. Pending clearance from Dr [name]  Patient continues to progress in cardiac rehab. His weight remains stable and his lipids are controlled with diet and medication. He is tolerating workload increases and feels his stamina and strength have improved.      Expected Outcomes  Patient will continue to participate in phase 2 cardiac rehab for exercise nutrition and lifestyle modifications.  Patient will continue to participate in phase 2 cardiac rehab for exercise nutrition and lifestyle modifications.         Core Components/Risk Factors/Patient Goals at Discharge (Final Review):  Goals and Risk Factor Review - 03/17/19 1658      Core Components/Risk Factors/Patient Goals Review   Personal Goals Review  Weight Management/Obesity;Lipids    Review  Patient continues to progress in cardiac  rehab. His weight remains stable and his lipids are controlled with diet and medication. He is tolerating workload increases and feels his stamina and strength have improved.    Expected Outcomes  Patient will continue to participate in phase 2 cardiac rehab for exercise nutrition and lifestyle modifications.       ITP Comments: ITP Comments    Row Name 02/11/19 1436 02/18/19 1615 03/17/19 1655       ITP Comments  Dr. Fransico Him, Medical Director  30 Day ITP Review. Patient's exercise is currently on hold pending clearnace on return to exercise per Dr [name]  30 Day ITP Review. Patient contines to do very well with participation and work load increases in CR. VSS. Denies complaint during exercise. He  is scheduled for graduation 03/26/2019. He feels he has gained stamina and strength.        Comments: See ITP comments

## 2019-03-19 ENCOUNTER — Encounter (HOSPITAL_COMMUNITY): Payer: Medicare Other

## 2019-03-22 ENCOUNTER — Other Ambulatory Visit: Payer: Self-pay

## 2019-03-22 ENCOUNTER — Encounter (HOSPITAL_COMMUNITY)
Admission: RE | Admit: 2019-03-22 | Discharge: 2019-03-22 | Disposition: A | Discharge status: Home / Self Care | Payer: Medicare Other | Source: Ambulatory Visit | Attending: Cardiology | Admitting: Cardiology

## 2019-03-22 DIAGNOSIS — Z951 Presence of aortocoronary bypass graft: Secondary | ICD-10-CM | POA: Diagnosis not present

## 2019-03-22 DIAGNOSIS — H409 Unspecified glaucoma: Secondary | ICD-10-CM | POA: Diagnosis not present

## 2019-03-22 DIAGNOSIS — Z7982 Long term (current) use of aspirin: Secondary | ICD-10-CM | POA: Diagnosis not present

## 2019-03-22 DIAGNOSIS — Z7901 Long term (current) use of anticoagulants: Secondary | ICD-10-CM | POA: Diagnosis not present

## 2019-03-22 DIAGNOSIS — E785 Hyperlipidemia, unspecified: Secondary | ICD-10-CM | POA: Diagnosis not present

## 2019-03-22 DIAGNOSIS — Z79899 Other long term (current) drug therapy: Secondary | ICD-10-CM | POA: Diagnosis not present

## 2019-03-24 ENCOUNTER — Other Ambulatory Visit: Payer: Self-pay

## 2019-03-24 ENCOUNTER — Encounter (HOSPITAL_COMMUNITY)
Admission: RE | Admit: 2019-03-24 | Discharge: 2019-03-24 | Disposition: A | Discharge status: Home / Self Care | Payer: Medicare Other | Source: Ambulatory Visit | Attending: Cardiology | Admitting: Cardiology

## 2019-03-24 ENCOUNTER — Other Ambulatory Visit: Payer: Self-pay | Admitting: Cardiology

## 2019-03-24 DIAGNOSIS — Z951 Presence of aortocoronary bypass graft: Secondary | ICD-10-CM

## 2019-03-24 DIAGNOSIS — E785 Hyperlipidemia, unspecified: Secondary | ICD-10-CM | POA: Diagnosis not present

## 2019-03-24 DIAGNOSIS — I2510 Atherosclerotic heart disease of native coronary artery without angina pectoris: Secondary | ICD-10-CM

## 2019-03-24 DIAGNOSIS — H409 Unspecified glaucoma: Secondary | ICD-10-CM | POA: Diagnosis not present

## 2019-03-24 DIAGNOSIS — Z7901 Long term (current) use of anticoagulants: Secondary | ICD-10-CM | POA: Diagnosis not present

## 2019-03-24 DIAGNOSIS — Z7982 Long term (current) use of aspirin: Secondary | ICD-10-CM | POA: Diagnosis not present

## 2019-03-24 DIAGNOSIS — Z79899 Other long term (current) drug therapy: Secondary | ICD-10-CM | POA: Diagnosis not present

## 2019-03-24 NOTE — Progress Notes (Signed)
Discharge Progress Report  Patient Details  Name: Brian Terry MRN: [id number] Date of Birth: [birth year] Referring Provider:     CARDIAC REHAB [facility] from 02/11/2019 in [place]  Referring Provider  Dr. [name]        Number of Visits: 16  Reason for Discharge:  Patient reached a stable level of exercise. Patient independent in their exercise. Patient has met program and personal goals.  Smoking History:  Social History   Tobacco Use  Smoking Status Never Smoker  Smokeless Tobacco Never Used    Diagnosis:  S/P CABG x 3  ADL UCSD:   Initial Exercise Prescription: Initial Exercise Prescription - 02/11/19 1400      Date of Initial Exercise RX and Referring Provider   Date  02/11/19    Referring Provider  Dr. [name]     Expected Discharge Date  03/26/19      Treadmill   MPH  2.6    Grade  1    Minutes  15      NuStep   Level  3    SPM  85    Minutes  15    METs  2.9      Prescription Details   Frequency (times per week)  3    Duration  Progress to 30 minutes of continuous aerobic without signs/symptoms of physical distress      Intensity   THRR 40-80% of Max Heartrate  57-114    Ratings of Perceived Exertion  11-13      Progression   Progression  Continue to progress workloads to maintain intensity without signs/symptoms of physical distress.      Resistance Training   Training Prescription  Yes    Weight  4 lbs.     Reps  10-15       Discharge Exercise Prescription (Final Exercise Prescription Changes): Exercise Prescription Changes - 03/24/19 1500      Response to Exercise   Blood Pressure (Admit)  108/70    Blood Pressure (Exercise)  108/64    Blood Pressure (Exit)  102/62    Heart Rate (Admit)  72 bpm    Heart Rate (Exercise)  92 bpm    Heart Rate (Exit)  56 bpm    Rating of Perceived Exertion (Exercise)  12    Symptoms  none    Comments  Pt graduated CR program today.     Duration   Continue with 30 min of aerobic exercise without signs/symptoms of physical distress.    Intensity  THRR unchanged      Progression   Progression  Continue to progress workloads to maintain intensity without signs/symptoms of physical distress.    Average METs  3.7      Resistance Training   Training Prescription  No      Interval Training   Interval Training  No      Recumbant Bike   Level  2.5    RPM  67    Watts  60    Minutes  15    METs  4.4      NuStep   Level  4    SPM  85    Minutes  15    METs  3      Home Exercise Plan   Plans to continue exercise at  Home (comment)    Frequency  Add 4 additional days to program exercise sessions.    Initial Home Exercises Provided  03/03/19       Functional Capacity: 6 Minute Walk    Row Name 02/11/19 1450 03/17/19 1509       6 Minute Walk   Phase  Initial  Discharge    Distance  1676 feet  2078 feet    Distance % Change  -  23.99 %    Distance Feet Change  -  402 ft    Walk Time  6 minutes  6 minutes    # of Rest Breaks  0  0    MPH  3.1  3.9    METS  3.6  4.7    RPE  10  13    Perceived Dyspnea   0  0    VO2 Peak  12.6  16.59    Symptoms  No  No    Resting HR  60 bpm  63 bpm    Resting BP  118/70  120/62    Resting Oxygen Saturation   98 %  -    Exercise Oxygen Saturation  during 6 min walk  98 %  -    Max Ex. HR  98 bpm  124 bpm    Max Ex. BP  132/76  152/78    2 Minute Post BP  104/68  102/60       Psychological, QOL, Others - Outcomes: PHQ 2/9: Depression screen [facility] 2/9 03/24/2019 02/11/2019  Decreased Interest 0 0  Down, Depressed, Hopeless 0 1  PHQ - 2 Score 0 1    Quality of Life: Quality of Life - 03/23/19 0745      Quality of Life   Select  Quality of Life      Quality of Life Scores   Health/Function Post  26.17 %    Socioeconomic Post  22.29 %    Psych/Spiritual Post  24.14 %    Family Post  27.3 %    GLOBAL Post  25.12 %       Personal Goals: Goals established at orientation  with interventions provided to work toward goal. Personal Goals and Risk Factors at Admission - 02/11/19 1458      Core Components/Risk Factors/Patient Goals on Admission    Weight Management  Yes;Weight Maintenance    Admit Weight  155 lb 13.8 oz (70.7 kg)    Lipids  Yes    Intervention  Provide education and support for participant on nutrition & aerobic/resistive exercise along with prescribed medications to achieve LDL <21m, HDL >410m    Expected Outcomes  Short Term: Participant states understanding of desired cholesterol values and is compliant with medications prescribed. Participant is following exercise prescription and nutrition guidelines.;Long Term: Cholesterol controlled with medications as prescribed, with individualized exercise RX and with personalized nutrition plan. Value goals: LDL < 7018mHDL > 40 mg.        Personal Goals Discharge: Goals and Risk Factor Review    Row Name 02/18/19 1619 03/17/19 1658 03/29/19 0735         Core Components/Risk Factors/Patient Goals Review   Personal Goals Review  Weight Management/Obesity;Lipids  Weight Management/Obesity;Lipids  Weight Management/Obesity;Lipids     Review  [facility] cardiac rehab is on hold. Pending clearance from Dr HocPercival Spanishatient continues to progress in cardiac rehab. His weight remains stable and his lipids are controlled with diet and medication. He is tolerating workload increases and feels his stamina and strength have improved.  [place] to continue his lifestyle modifications including exercising 5 days a week,  adhering to a low cholesterol, heart healthy diet. He continues to take all medications as prescribed and attend all medical appointment.     Expected Outcomes  Patient will continue to participate in phase 2 cardiac rehab for exercise nutrition and lifestyle modifications.  Patient will continue to participate in phase 2 cardiac rehab for exercise nutrition and lifestyle modifications.  Patient will  continue lifestyle modification and exercise post discharge to reduce his CAD risk factors.        Exercise Goals and Review: Exercise Goals    Row Name 02/11/19 1455             Exercise Goals   Increase Physical Activity  Yes       Intervention  Provide advice, education, support and counseling about physical activity/exercise needs.;Develop an individualized exercise prescription for aerobic and resistive training based on initial evaluation findings, risk stratification, comorbidities and participant's personal goals.       Expected Outcomes  Short Term: Attend rehab on a regular basis to increase amount of physical activity.;Long Term: Add in home exercise to make exercise part of routine and to increase amount of physical activity.;Long Term: Exercising regularly at least 3-5 days a week.       Increase Strength and Stamina  Yes       Intervention  Provide advice, education, support and counseling about physical activity/exercise needs.;Develop an individualized exercise prescription for aerobic and resistive training based on initial evaluation findings, risk stratification, comorbidities and participant's personal goals.       Expected Outcomes  Short Term: Increase workloads from initial exercise prescription for resistance, speed, and METs.;Short Term: Perform resistance training exercises routinely during rehab and add in resistance training at home;Long Term: Improve cardiorespiratory fitness, muscular endurance and strength as measured by increased METs and functional capacity (6MWT)       Able to understand and use rate of perceived exertion (RPE) scale  Yes       Intervention  Provide education and explanation on how to use RPE scale       Expected Outcomes  Short Term: Able to use RPE daily in rehab to express subjective intensity level;Long Term:  Able to use RPE to guide intensity level when exercising independently       Knowledge and understanding of Target Heart Rate Range  (THRR)  Yes       Intervention  Provide education and explanation of THRR including how the numbers were predicted and where they are located for reference       Expected Outcomes  Short Term: Able to state/look up THRR;Long Term: Able to use THRR to govern intensity when exercising independently;Short Term: Able to use daily as guideline for intensity in rehab       Able to check pulse independently  Yes       Intervention  Provide education and demonstration on how to check pulse in carotid and radial arteries.;Review the importance of being able to check your own pulse for safety during independent exercise       Expected Outcomes  Short Term: Able to explain why pulse checking is important during independent exercise;Long Term: Able to check pulse independently and accurately       Understanding of Exercise Prescription  Yes       Intervention  Provide education, explanation, and written materials on patient's individual exercise prescription       Expected Outcomes  Short Term: Able to explain program exercise prescription;Long Term: Able to  explain home exercise prescription to exercise independently          Exercise Goals Re-Evaluation: Exercise Goals Re-Evaluation    Row Name 02/15/19 1622 03/03/19 1524 03/17/19 1345 03/24/19 1501       Exercise Goal Re-Evaluation   Exercise Goals Review  Increase Physical Activity;Increase Strength and Stamina;Able to understand and use rate of perceived exertion (RPE) scale;Knowledge and understanding of Target Heart Rate Range (THRR);Understanding of Exercise Prescription  Increase Physical Activity;Increase Strength and Stamina;Able to understand and use rate of perceived exertion (RPE) scale;Knowledge and understanding of Target Heart Rate Range (THRR);Able to check pulse independently;Understanding of Exercise Prescription  Increase Physical Activity;Increase Strength and Stamina;Able to understand and use rate of perceived exertion (RPE)  scale;Knowledge and understanding of Target Heart Rate Range (THRR);Able to check pulse independently;Understanding of Exercise Prescription  Increase Physical Activity;Increase Strength and Stamina;Able to understand and use rate of perceived exertion (RPE) scale;Knowledge and understanding of Target Heart Rate Range (THRR);Able to check pulse independently;Understanding of Exercise Prescription    Comments  Pt first day of CR program. Pt experienced chest discomfort while walking on the treadmill. RN notified. Pt tolerated Nustep well. Pt notified us of CP after exercise was complete.  Reviewed HEP with Pt. Pt expressed understanding of goals, THRR, RPE scale, weather precautions, end points of exercise, warm up and cool down stretches, NTG use, and exercise Rx. Pt is currently walking at home for 30 minutes in addition to CR program.  Reviewed HEP with Pt. Pt is progressing well and has a MET level of 3.8. Pt continues to increase his MET levels and workloads each session. Pt continues to walk at home 2-4 days per week for 30-45 minutes in addition to CR program.  Pt graduated CR program today. Pt progress well and ended with a MET level of 3.7. Pt increased workloads and MET level gradually. Pt stated he will continue exercising at home by walking, golfing, and using hand weights 5-7 days per week for 30-45 minutes.    Expected Outcomes  Will continue to monitor and progress Pt as tolerated.  Will continue to monitor and progress Pt as tolerated.  Will continue to monitor and progress Pt as tolerated.  Will continue exercising at home.       Nutrition & Weight - Outcomes: Pre Biometrics - 02/11/19 1455      Pre Biometrics   Height  _0  (1.803 m)    Weight  70.7 kg    Waist Circumference  34 inches    Hip Circumference  39 inches    Waist to Hip Ratio  0.87 %    BMI (Calculated)  21.75    Triceps Skinfold  15 mm    % Body Fat  22.7 %    Grip Strength  31 kg    Flexibility  0 in    Single  Leg Stand  3.87 seconds      Post Biometrics - 03/17/19 1510       Post  Biometrics   Height  _1  (1.803 m)    Weight  70.7 kg    Waist Circumference  33 inches    Hip Circumference  38.5 inches    Waist to Hip Ratio  0.86 %    BMI (Calculated)  21.75    Triceps Skinfold  15 mm    % Body Fat  22.2 %    Grip Strength  33 kg    Flexibility  9.5 in  Single Leg Stand  6.81 seconds       Nutrition:   Nutrition Discharge:   Education Questionnaire Score: Knowledge Questionnaire Score - 03/23/19 0745      Knowledge Questionnaire Score   Post Score  24/24       Pt graduated from cardiac rehab program today with completion of 16 exercise sessions in Phase II. Pt maintained good attendance and progressed nicely during his participation in rehab as evidenced by increased MET level.   Medication list reconciled. Repeat  PHQ score-0.  Pt has made significant lifestyle changes and should be commended for his success. Pt feels he has achieved his goals during cardiac rehab.  Goals reviewed with patient; copy given to patient.

## 2019-03-26 ENCOUNTER — Encounter (HOSPITAL_COMMUNITY): Payer: Medicare Other

## 2019-03-26 IMAGING — DX CHEST - 2 VIEW
2 series · 2 of 2 positions shown · non-contrast
Comparison: 08/04/2018

CLINICAL DATA: S post coronary bypass grafting

EXAM:
CHEST - 2 VIEW

[dg chest 2 view (1 of 2)]
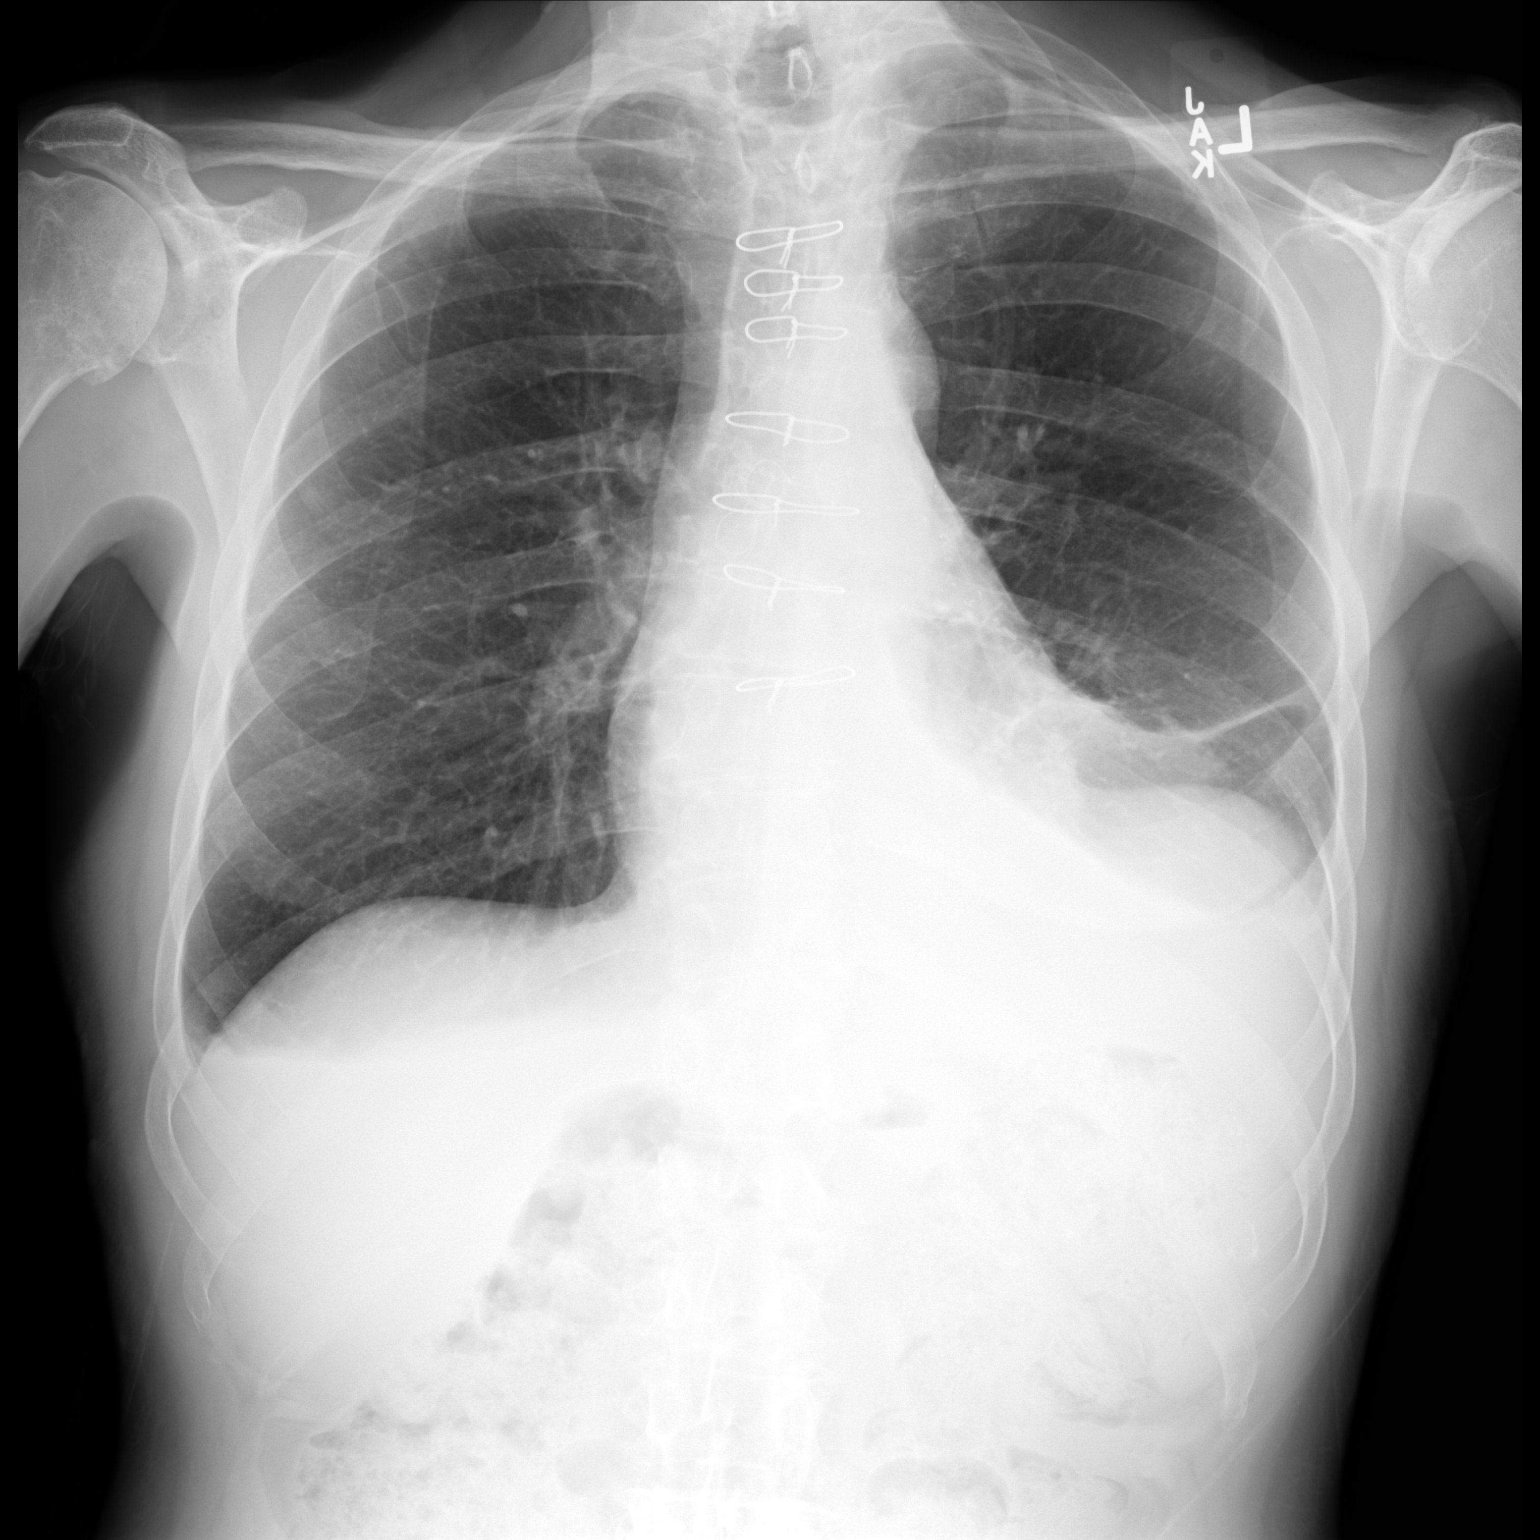

[dg chest 2 view (2 of 2)]
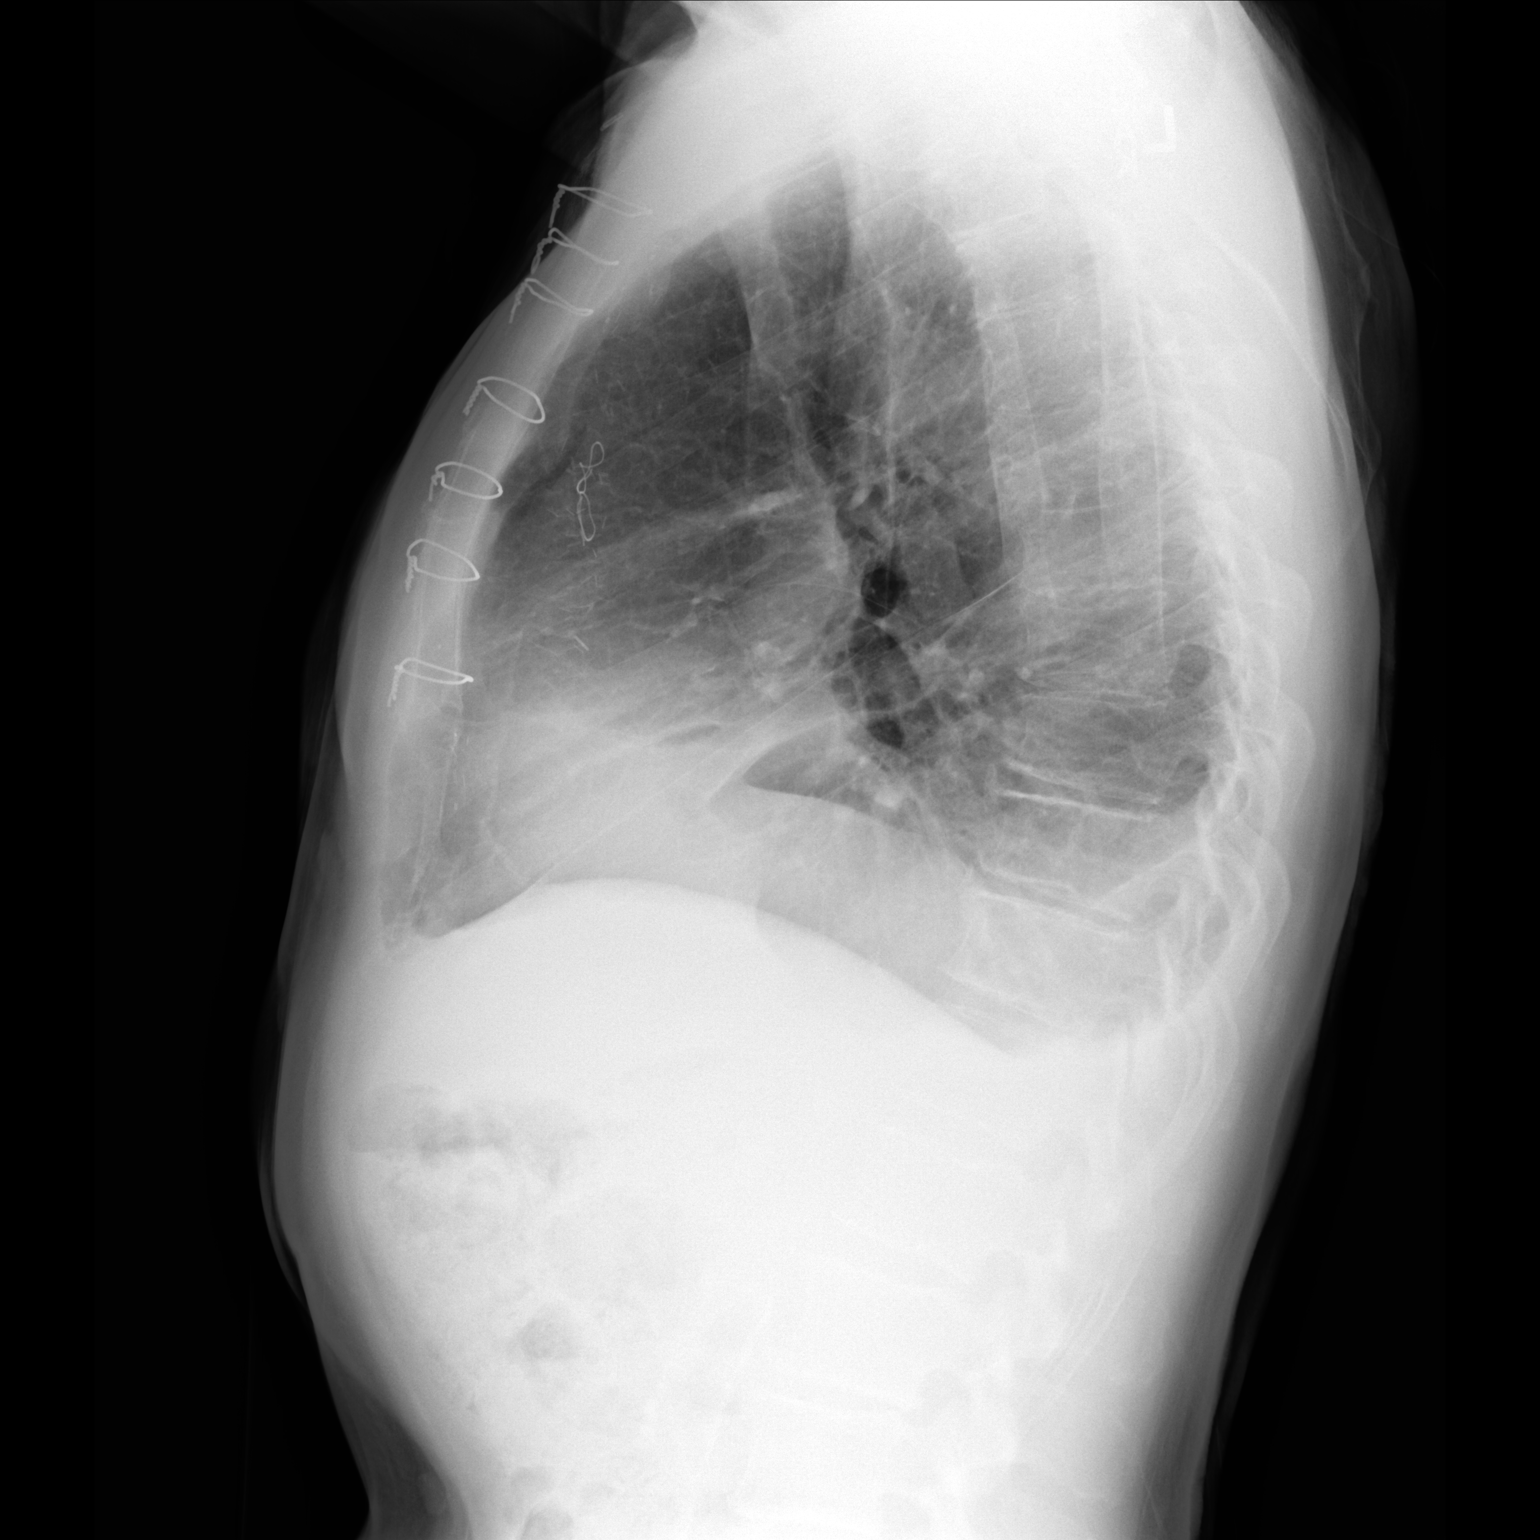

[2 of 2 positions shown; findings below may reference images not displayed]

FINDINGS: Cardiac shadow is enlarged but stable. Persistent left-sided pleural
effusion is noted although slightly decreased when compared with the
prior exam. Mild left basilar atelectasis is seen. Minimal right
posterior fusion is noted. No bony abnormality is seen.
IMPRESSION: Slight improvement in left-sided pleural effusion when compare with
the prior exam. Mild basilar atelectasis on the left is noted.

Stable small right pleural effusion.

## 2019-03-30 DIAGNOSIS — H6122 Impacted cerumen, left ear: Secondary | ICD-10-CM | POA: Diagnosis not present

## 2019-04-06 ENCOUNTER — Encounter (HOSPITAL_COMMUNITY)
Admission: RE | Admit: 2019-04-06 | Discharge: 2019-04-06 | Disposition: A | Discharge status: Home / Self Care | Payer: Medicare Other | Source: Ambulatory Visit | Attending: Cardiology | Admitting: Cardiology

## 2019-04-06 ENCOUNTER — Encounter (HOSPITAL_COMMUNITY): Payer: Self-pay

## 2019-04-06 ENCOUNTER — Telehealth (HOSPITAL_COMMUNITY): Payer: Self-pay

## 2019-04-06 DIAGNOSIS — Z7901 Long term (current) use of anticoagulants: Secondary | ICD-10-CM | POA: Insufficient documentation

## 2019-04-06 DIAGNOSIS — Z7982 Long term (current) use of aspirin: Secondary | ICD-10-CM | POA: Insufficient documentation

## 2019-04-06 DIAGNOSIS — Z951 Presence of aortocoronary bypass graft: Secondary | ICD-10-CM | POA: Insufficient documentation

## 2019-04-06 DIAGNOSIS — E785 Hyperlipidemia, unspecified: Secondary | ICD-10-CM | POA: Insufficient documentation

## 2019-04-06 DIAGNOSIS — Z79899 Other long term (current) drug therapy: Secondary | ICD-10-CM | POA: Insufficient documentation

## 2019-04-06 DIAGNOSIS — H409 Unspecified glaucoma: Secondary | ICD-10-CM | POA: Insufficient documentation

## 2019-04-06 NOTE — Progress Notes (Signed)
Brian Terry has completed the virtual cardiac rehab program and will continue exercise independently at this time. Patient's virtual cardiac rehab report will be scanned to media for review.

## 2019-04-06 NOTE — Telephone Encounter (Signed)
Phone call to Pt to inquire about inactivity in Virtual CR app. Pt stated he is holding himself accountable and no longer needs the virtual app. Pt will be discharged from the virtual app and will continue exercising on his own.

## 2019-05-03 DIAGNOSIS — H903 Sensorineural hearing loss, bilateral: Secondary | ICD-10-CM | POA: Diagnosis not present

## 2019-06-18 DIAGNOSIS — I2510 Atherosclerotic heart disease of native coronary artery without angina pectoris: Secondary | ICD-10-CM | POA: Diagnosis not present

## 2019-06-18 DIAGNOSIS — I5030 Unspecified diastolic (congestive) heart failure: Secondary | ICD-10-CM | POA: Diagnosis not present

## 2019-06-18 DIAGNOSIS — Z0000 Encounter for general adult medical examination without abnormal findings: Secondary | ICD-10-CM | POA: Diagnosis not present

## 2019-06-18 DIAGNOSIS — H409 Unspecified glaucoma: Secondary | ICD-10-CM | POA: Diagnosis not present

## 2019-06-21 ENCOUNTER — Other Ambulatory Visit: Payer: Self-pay | Admitting: Cardiology

## 2019-06-21 DIAGNOSIS — I2510 Atherosclerotic heart disease of native coronary artery without angina pectoris: Secondary | ICD-10-CM

## 2019-06-21 DIAGNOSIS — Z951 Presence of aortocoronary bypass graft: Secondary | ICD-10-CM

## 2019-06-23 ENCOUNTER — Other Ambulatory Visit: Payer: Self-pay | Admitting: Cardiology

## 2019-06-25 DIAGNOSIS — H401131 Primary open-angle glaucoma, bilateral, mild stage: Secondary | ICD-10-CM | POA: Diagnosis not present

## 2019-07-06 ENCOUNTER — Encounter: Payer: Self-pay | Admitting: Cardiology

## 2019-07-06 DIAGNOSIS — E785 Hyperlipidemia, unspecified: Secondary | ICD-10-CM | POA: Diagnosis not present

## 2019-07-06 DIAGNOSIS — I5030 Unspecified diastolic (congestive) heart failure: Secondary | ICD-10-CM | POA: Diagnosis not present

## 2019-07-06 DIAGNOSIS — I2510 Atherosclerotic heart disease of native coronary artery without angina pectoris: Secondary | ICD-10-CM | POA: Diagnosis not present

## 2019-07-06 DIAGNOSIS — Z0000 Encounter for general adult medical examination without abnormal findings: Secondary | ICD-10-CM | POA: Diagnosis not present

## 2019-07-26 DIAGNOSIS — Z0120 Encounter for dental examination and cleaning without abnormal findings: Secondary | ICD-10-CM | POA: Diagnosis not present

## 2019-08-03 DIAGNOSIS — E785 Hyperlipidemia, unspecified: Secondary | ICD-10-CM | POA: Diagnosis not present

## 2019-08-03 DIAGNOSIS — Z79899 Other long term (current) drug therapy: Secondary | ICD-10-CM | POA: Diagnosis not present

## 2019-08-03 DIAGNOSIS — R972 Elevated prostate specific antigen [PSA]: Secondary | ICD-10-CM | POA: Diagnosis not present

## 2019-08-13 ENCOUNTER — Emergency Department (HOSPITAL_BASED_OUTPATIENT_CLINIC_OR_DEPARTMENT_OTHER)
Admission: EM | Admit: 2019-08-13 | Discharge: 2019-08-13 | Disposition: A | Discharge status: Home / Self Care | Payer: Medicare Other | Attending: Emergency Medicine | Admitting: Emergency Medicine

## 2019-08-13 ENCOUNTER — Other Ambulatory Visit: Payer: Self-pay

## 2019-08-13 ENCOUNTER — Encounter (HOSPITAL_BASED_OUTPATIENT_CLINIC_OR_DEPARTMENT_OTHER): Payer: Self-pay

## 2019-08-13 ENCOUNTER — Emergency Department (HOSPITAL_BASED_OUTPATIENT_CLINIC_OR_DEPARTMENT_OTHER): Payer: Medicare Other

## 2019-08-13 DIAGNOSIS — I2510 Atherosclerotic heart disease of native coronary artery without angina pectoris: Secondary | ICD-10-CM | POA: Insufficient documentation

## 2019-08-13 DIAGNOSIS — I868 Varicose veins of other specified sites: Secondary | ICD-10-CM | POA: Diagnosis not present

## 2019-08-13 DIAGNOSIS — Z951 Presence of aortocoronary bypass graft: Secondary | ICD-10-CM | POA: Insufficient documentation

## 2019-08-13 DIAGNOSIS — I8002 Phlebitis and thrombophlebitis of superficial vessels of left lower extremity: Secondary | ICD-10-CM | POA: Diagnosis not present

## 2019-08-13 DIAGNOSIS — Z882 Allergy status to sulfonamides status: Secondary | ICD-10-CM | POA: Insufficient documentation

## 2019-08-13 DIAGNOSIS — Z79899 Other long term (current) drug therapy: Secondary | ICD-10-CM | POA: Insufficient documentation

## 2019-08-13 DIAGNOSIS — R2242 Localized swelling, mass and lump, left lower limb: Secondary | ICD-10-CM | POA: Diagnosis present

## 2019-08-13 DIAGNOSIS — E785 Hyperlipidemia, unspecified: Secondary | ICD-10-CM | POA: Diagnosis not present

## 2019-08-13 DIAGNOSIS — M79605 Pain in left leg: Secondary | ICD-10-CM | POA: Diagnosis not present

## 2019-08-13 NOTE — ED Provider Notes (Signed)
[facility] Provider Note   CSN: [id number] Arrival date & time: 08/13/19  1357     History Chief Complaint  Patient presents with  . Leg Swelling    Brian Terry is a 79 y.o. male.  Brian Terry is a 79 y.o. male with a history of hyperlipidemia, GERD, CAD s/p CABG, who presents to the emergency department from his PCPs office for evaluation of right lower extremity redness swelling and pain.  Sent to rule out DVT.  Patient states that about 4:00 in the morning he woke up with pain along the medial aspect of the left shin and noticed some redness starting at the scar from his previous vein harvesting for his CABG which was done 13 months ago.  He states it is tender to the touch and he has noticed some swelling in his leg.  He went to see his PCP today who felt that this could potentially be thrombophlebitis versus DVT.  Patient is not having any chest pain or shortness of breath.  He is not on any blood thinners, denies any previous history of DVT.  No other aggravating or alleviating factors.        Past Medical History:  Diagnosis Date  . GERD (gastroesophageal reflux disease)   . Glaucoma   . Hemorrhoids   . Hyperlipidemia     Patient Active Problem List   Diagnosis Date Noted  . Orthostatic hypotension 10/09/2018  . Pleural effusion 10/09/2018  . 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 CAD (coronary artery disease) 07/14/2018  . CAD in native artery 07/06/2018  . Abnormal stress test 06/29/2018  . Medication management 06/29/2018  . Hyperlipidemia 06/29/2018  . Esophageal stricture 03/01/2015  . Duodenitis 03/01/2015  . Dysphagia, pharyngoesophageal phase 02/24/2015  . GILBERT'S SYNDROME 10/22/2006  . ANHIDROSIS 10/22/2006    Past Surgical History:  Procedure Laterality Date  . BALLOON DILATION N/A 03/01/2015   Procedure: BALLOON DILATION;  Surgeon: [name], MD;  Location: [facility];  Service: Endoscopy;  Laterality: N/A;  .  CATARACT EXTRACTION    . CORONARY ARTERY BYPASS GRAFT N/A 07/16/2018   Procedure: CORONARY ARTERY BYPASS GRAFTING (CABG), ON PUMP, TIMES THREE, USING LEFT INTERNAL MAMMARY ARTERY AND LEFT GREATER SAHENOUS VEIN HARVESTED ENDOSCOPICALLY;  Surgeon: [name], MD;  Location: [facility];  Service: Open Heart Surgery;  Laterality: N/A;  . ESOPHAGOGASTRODUODENOSCOPY N/A 03/01/2015   Procedure: ESOPHAGOGASTRODUODENOSCOPY (EGD);  Surgeon: [name], MD;  Location: [facility];  Service: Endoscopy;  Laterality: N/A;  . HERNIA REPAIR Left 2010   inguinal   . LEFT HEART CATH AND CORONARY ANGIOGRAPHY N/A 07/06/2018   Procedure: LEFT HEART CATH AND CORONARY ANGIOGRAPHY;  Surgeon: [name], MD;  Location: [facility];  Service: Cardiovascular;  Laterality: N/A;  . RETINAL DETACHMENT SURGERY Right 2000  . TEE WITHOUT CARDIOVERSION N/A 07/16/2018   Procedure: TRANSESOPHAGEAL ECHOCARDIOGRAM (TEE);  Surgeon: [name], MD;  Location: [facility];  Service: Open Heart Surgery;  Laterality: N/A;  . vein removal Right    R leg       Family History  Problem Relation Age of Onset  . Heart disease Mother   . Colon polyps Father   . Stomach cancer Father   . Heart disease Brother 5       Stent    Social History   Tobacco Use  . Smoking status: Never Smoker  . Smokeless tobacco: Never Used  Substance Use Topics  . Alcohol  use: No    Alcohol/week: 0.0 standard drinks  . Drug use: No    Home Medications Prior to Admission medications   Medication Sig Start Date End Date Taking? Authorizing Provider  acetaminophen (TYLENOL) 650 MG CR tablet Take 650 mg by mouth daily.     [provider]  Ascorbic Acid (VITAMIN C WITH ROSE HIPS) 1000 MG tablet Take 1,000 mg by mouth daily.    [provider]  aspirin EC 81 MG tablet Take 81 mg by mouth daily.    [provider]  carbamide peroxide (DEBROX) 6.5 % OTIC solution Place 5 drops into the right ear daily as needed  (for earwax build up).    [provider]  furosemide (LASIX) 20 MG tablet Take 20 mg by mouth daily. Only for 3 days with fluid build up in left ankle    [provider]  midodrine (PROAMATINE) 10 MG tablet TAKE  (1)  TABLET  THREE TIMES DAILY AFTER MEALS. 06/23/19   Minus Breeding, MD  Multiple Vitamin (MULTIVITAMIN WITH MINERALS) TABS tablet Take 1 tablet by mouth daily.     [provider]  nitroGLYCERIN (NITROSTAT) 0.4 MG SL tablet Place 1 tablet (0.4 mg total) under the tongue every 5 (five) minutes as needed for chest pain. 07/06/18 07/06/19  End, Harrell Gave, MD  Psyllium (METAMUCIL PO) Take 2-4 capsules by mouth daily.     [provider]  rosuvastatin (CRESTOR) 20 MG tablet TAKE 1 TABLET ONCE DAILY. 06/24/19   Minus Breeding, MD  tamsulosin (FLOMAX) 0.4 MG CAPS capsule Take 0.4 mg by mouth daily after supper. 30 minutes after supper.    [provider]  timolol (TIMOPTIC) 0.5 % ophthalmic solution Place 1 drop into both eyes 2 (two) times daily.  01/27/15   [provider]    Allergies    Sulfa antibiotics  Review of Systems   Review of Systems  Constitutional: Negative for chills and fever.  HENT: Negative.   Respiratory: Negative for cough and shortness of breath.   Cardiovascular: Positive for leg swelling. Negative for chest pain.  Gastrointestinal: Negative for abdominal pain, nausea and vomiting.  Musculoskeletal: Negative for arthralgias and myalgias.  Skin: Positive for color change. Negative for rash.  Neurological: Negative for weakness and numbness.  All other systems reviewed and are negative.   Physical Exam Updated Vital Signs BP 127/88 (BP Location: Left Arm)   Pulse (!) 54   Temp 98.5 F (36.9 C) (Oral)   Resp 18   Ht 5\' 11"  (1.803 m)   Wt 68.5 kg   SpO2 100%   BMI 21.06 kg/m   Physical Exam Vitals and nursing note reviewed.  Constitutional:      General: He is not in acute distress.    Appearance:  Normal appearance. He is well-developed and normal weight. He is not ill-appearing or diaphoretic.     Comments: Well-appearing and in no distress  HENT:     Head: Normocephalic and atraumatic.  Eyes:     General:        Right eye: No discharge.        Left eye: No discharge.     Pupils: Pupils are equal, round, and reactive to light.  Cardiovascular:     Rate and Rhythm: Normal rate and regular rhythm.     Pulses:          Radial pulses are 2+ on the right side and 2+ on the left side.  Dorsalis pedis pulses are 2+ on the right side and 2+ on the left side.       Posterior tibial pulses are 2+ on the right side and 2+ on the left side.     Heart sounds: Normal heart sounds. No murmur. No friction rub. No gallop.   Pulmonary:     Effort: Pulmonary effort is normal. No respiratory distress.     Breath sounds: Normal breath sounds. No wheezing or rales.     Comments: Respirations equal and unlabored, patient able to speak in full sentences, lungs clear to auscultation bilaterally Abdominal:     General: Bowel sounds are normal. There is no distension.     Palpations: Abdomen is soft. There is no mass.     Tenderness: There is no abdominal tenderness. There is no guarding.     Comments: Abdomen soft, nondistended, nontender to palpation in all quadrants without guarding or peritoneal signs  Musculoskeletal:        General: No deformity.     Cervical back: Neck supple.     Left lower leg: Edema present.     Comments: Left lower extremity with some erythema and tenderness over the medial shin that begins at the scar from patient's CABG vein harvesting with some surrounding edema, erythema is not circumferential, does not continue up to the knee joint.  Distal pulses intact, 5/5 strength and normal sensation.  No erythema, warmth or swelling of the right lower extremity  Skin:    General: Skin is warm and dry.     Capillary Refill: Capillary refill takes less than 2 seconds.    Neurological:     Mental Status: He is alert.     Coordination: Coordination normal.     Comments: Speech is clear, able to follow commands Moves extremities without ataxia, coordination intact  Psychiatric:        Mood and Affect: Mood normal.        Behavior: Behavior normal.     ED Results / Procedures / Treatments   Labs (all labs ordered are listed, but only abnormal results are displayed) Labs Reviewed - No data to display  EKG None  Radiology  EXAM: LEFT LOWER EXTREMITY VENOUS DOPPLER ULTRASOUND  TECHNIQUE: Gray-scale sonography with graded compression, as well as color Doppler and duplex ultrasound were performed to evaluate the lower extremity deep venous systems from the level of the common femoral vein and including the common femoral, femoral, profunda femoral, popliteal and calf veins including the posterior tibial, peroneal and gastrocnemius veins when visible. The superficial great saphenous vein was also interrogated. Spectral Doppler was utilized to evaluate flow at rest and with distal augmentation maneuvers in the common femoral, femoral and popliteal veins.  COMPARISON: None.  FINDINGS: Contralateral Common Femoral Vein: Respiratory phasicity is normal and symmetric with the symptomatic side. No evidence of thrombus. Normal compressibility.  Common Femoral Vein: No evidence of thrombus. Normal compressibility, respiratory phasicity and response to augmentation.  Saphenofemoral Junction: No evidence of thrombus. Normal compressibility and flow on color Doppler imaging.  Profunda Femoral Vein: No evidence of thrombus. Normal compressibility and flow on color Doppler imaging.  Femoral Vein: No evidence of thrombus. Normal compressibility, respiratory phasicity and response to augmentation.  Popliteal Vein: No evidence of thrombus. Normal compressibility, respiratory phasicity and response to augmentation.  Calf Veins: No evidence of  thrombus. Normal compressibility and flow on color Doppler imaging.  Superficial Great Saphenous Vein: No evidence of thrombus. Normal compressibility.  Other Findings: Rounded  hypoechoic nodule in the upper calf measuring 1.8 x 1.4 cm corresponds to the palpable lesion. This lesion has no blood flow.  IMPRESSION: 1. No evidence deep venous thrombosis in LEFT lower extremity. 2. Rounded hypoechoic nodule in the upper calf without blood flow. Favor chronic hematoma versus thrombosed superficial vessel.   Electronically Signed By: Suzy Bouchard M.D. On: 08/13/2019 15:30  Procedures Procedures (including critical care time)  Medications Ordered in ED Medications - No data to display  ED Course  I have reviewed the triage vital signs and the nursing notes.  Pertinent labs & imaging results that were available during my care of the patient were reviewed by me and considered in my medical decision making (see chart for details).    MDM Rules/Calculators/A&P                      79 year old male sent from PCPs office to rule out DVT, at 4 AM noticed some redness swelling and warmth to the medial aspect of the left lower leg, this is seen on exam with some mild swelling, question whether patient could have thrombophlebitis versus DVT, feel that cellulitis is less likely.  Will get ultrasound.  No prior history.  Ultrasound without evidence of DVT, does show rounded hypoechoic area suggestive of hematoma versus superficial thrombosed vein, clinical exam favors superficial thrombophlebitis will treat with full dose aspirin, warm compresses and compression stockings and have patient follow-up closely with his PCP.  He expresses understanding and agreement with this plan.  Discharged home in good condition.  Final Clinical Impression(s) / ED Diagnoses Final diagnoses:  Thrombophlebitis of superficial veins of left lower extremity    Rx / DC Orders ED Discharge Orders    None        Janet Berlin 08/13/19 1636    Truddie Hidden, MD 08/14/19 [phone]

## 2019-08-13 NOTE — ED Triage Notes (Signed)
Pt c/o swelling/redness to left LE x today-denies injury-sent from [facility] PCP-NAD-steady gait

## 2019-08-13 NOTE — ED Notes (Signed)
Patient transported to Ultrasound 

## 2019-08-13 NOTE — Discharge Instructions (Signed)
Take full dose aspirin 325 mg daily for 1 week, wear compression stockings to heal reduce swelling and pain, apply warm compresses.  Please follow-up with your primary care doctor in 1 week for reevaluation.  Your ultrasound today did not show any evidence of blood clots.  If you develop fevers, spreading redness or any other new or concerning symptoms return to the ED or see your PCP.

## 2019-08-20 DIAGNOSIS — I809 Phlebitis and thrombophlebitis of unspecified site: Secondary | ICD-10-CM | POA: Diagnosis not present

## 2019-09-05 DIAGNOSIS — I5032 Chronic diastolic (congestive) heart failure: Secondary | ICD-10-CM | POA: Insufficient documentation

## 2019-09-05 NOTE — Progress Notes (Signed)
Cardiology Office Note   Date:  09/06/2019   ID:  Brian Terry, DOB 1940-10-11, MRN [id number]  PCP:  [name], MD  Cardiologist:   Minus Breeding, MD   Chief Complaint  Patient presents with  . Coronary Artery Disease      History of Present Illness: Brian Terry is a 79 y.o. male who presents follow up of CAD/CABG.    Since I last saw him he has done well.  He had a superficial blood clot on his left lower leg.  Otherwise he has done well.  The patient denies any new symptoms such as chest discomfort, neck or arm discomfort. There has been no new shortness of breath, PND or orthopnea. There have been no reported palpitations, presyncope or syncope.   Past Medical History:  Diagnosis Date  . GERD (gastroesophageal reflux disease)   . Glaucoma   . Hemorrhoids   . Hyperlipidemia     Past Surgical History:  Procedure Laterality Date  . BALLOON DILATION N/A 03/01/2015   Procedure: BALLOON DILATION;  Surgeon: [name], MD;  Location: [facility];  Service: Endoscopy;  Laterality: N/A;  . CATARACT EXTRACTION    . CORONARY ARTERY BYPASS GRAFT N/A 07/16/2018   Procedure: CORONARY ARTERY BYPASS GRAFTING (CABG), ON PUMP, TIMES THREE, USING LEFT INTERNAL MAMMARY ARTERY AND LEFT GREATER SAHENOUS VEIN HARVESTED ENDOSCOPICALLY;  Surgeon: [name], MD;  Location: [facility];  Service: Open Heart Surgery;  Laterality: N/A;  . ESOPHAGOGASTRODUODENOSCOPY N/A 03/01/2015   Procedure: ESOPHAGOGASTRODUODENOSCOPY (EGD);  Surgeon: [name], MD;  Location: [facility];  Service: Endoscopy;  Laterality: N/A;  . HERNIA REPAIR Left 2010   inguinal   . LEFT HEART CATH AND CORONARY ANGIOGRAPHY N/A 07/06/2018   Procedure: LEFT HEART CATH AND CORONARY ANGIOGRAPHY;  Surgeon: [name], MD;  Location: [facility];  Service: Cardiovascular;  Laterality: N/A;  . RETINAL DETACHMENT SURGERY Right 2000  . TEE WITHOUT CARDIOVERSION N/A 07/16/2018   Procedure: TRANSESOPHAGEAL  ECHOCARDIOGRAM (TEE);  Surgeon: [name], MD;  Location: [facility];  Service: Open Heart Surgery;  Laterality: N/A;  . vein removal Right    R leg     Current Outpatient Medications  Medication Sig Dispense Refill  . acetaminophen (TYLENOL) 650 MG CR tablet Take 650 mg by mouth daily.     . Ascorbic Acid (VITAMIN C WITH ROSE HIPS) 1000 MG tablet Take 1,000 mg by mouth daily.    Marland Kitchen aspirin EC 81 MG tablet Take 81 mg by mouth daily.    . furosemide (LASIX) 20 MG tablet Take 20 mg by mouth daily. Only for 3 days with fluid build up in left ankle    . midodrine (PROAMATINE) 10 MG tablet TAKE  (1)  TABLET  THREE TIMES DAILY AFTER MEALS. 270 tablet 0  . Multiple Vitamin (MULTIVITAMIN WITH MINERALS) TABS tablet Take 1 tablet by mouth daily.     . nitroGLYCERIN (NITROSTAT) 0.4 MG SL tablet Place 1 tablet (0.4 mg total) under the tongue every 5 (five) minutes as needed for chest pain. 30 tablet prn  . Psyllium (METAMUCIL PO) Take 2-4 capsules by mouth daily.     . rosuvastatin (CRESTOR) 20 MG tablet TAKE 1 TABLET ONCE DAILY. 90 tablet 0  . tamsulosin (FLOMAX) 0.4 MG CAPS capsule Take 0.4 mg by mouth daily after supper. 30 minutes after supper.    . timolol (TIMOPTIC) 0.5 % ophthalmic solution Place 1 drop into both eyes 2 (two) times  daily.   98   No current facility-administered medications for this visit.    Allergies:   Sulfa antibiotics    ROS:  Please see the history of present illness.   Otherwise, review of systems are positive for no.   All other systems are reviewed and negative.    PHYSICAL EXAM: VS:  BP 90/64   Pulse (!) 57   Ht 5\' 11"  (1.803 m)   Wt 155 lb 9.6 oz (70.6 kg)   BMI 21.70 kg/m  , BMI Body mass index is 21.7 kg/m. GENERAL:  Well appearing NECK:  No jugular venous distention, waveform within normal limits, carotid upstroke brisk and symmetric, no bruits, no thyromegaly LUNGS:  Clear to auscultation bilaterally CHEST:  Well healed sternotomy scar. HEART:  PMI  not displaced or sustained,S1 and S2 within normal limits, no S3, no S4, no clicks, no rubs, no murmurs ABD:  Flat, positive bowel sounds normal in frequency in pitch, no bruits, no rebound, no guarding, no midline pulsatile mass, no hepatomegaly, no splenomegaly EXT:  2 plus pulses throughout, no edema, no cyanosis no clubbing    EKG:  EKG is  ordered today. The ekg ordered today demonstrates sinus bradycardia, rate 57, left axis deviation, no acute ST-T wave changes.   Recent Labs: 02/25/2019: ALT 23; BUN 11; Creatinine, Ser 0.87; Potassium 4.8; Sodium 141    Lipid Panel    Component Value Date/Time   CHOL 171 02/25/2019 0820   TRIG 57 02/25/2019 0820   HDL 83 02/25/2019 0820   CHOLHDL 2.1 02/25/2019 0820   CHOLHDL 3.6 CALC 10/24/2006 1158   VLDL 16 10/24/2006 1158   LDLCALC 77 02/25/2019 0820   LDLDIRECT 164.4 10/24/2006 1158      Wt Readings from Last 3 Encounters:  09/06/19 155 lb 9.6 oz (70.6 kg)  08/13/19 151 lb (68.5 kg)  03/17/19 155 lb 13.8 oz (70.7 kg)      Other studies Reviewed: Additional studies/ records that were reviewed today include: Labs. Review of the above records demonstrates:  See below.   ASSESSMENT AND PLAN:  CADs/pCABG:   He has done well.  No change in therapy.  Call [name] with the results and send results to [name], MD  Hyperlipidemia:   His LDL was very mildly elevated earlier this year and his primary provider increased his Crestor.  I will defer to his management.  Orthostatic hypotension:  He is not having any symptoms related to this.  We talked about compression stockings.  We talked about Flomax as potentially contributor if he has symptoms.  Again he is asymptomatic so no change in therapy.   Covid education: He is completely vaccinated.  Current medicines are reviewed at length with the patient today.  The patient does not have concerns regarding medicines.  The following changes have been made:  None  Labs/  tests ordered today include: None  Orders Placed This Encounter  Procedures  . EKG 12-Lead     Disposition:   FU with me in 12 months.     Signed, Minus Breeding, MD  09/06/2019 9:26 AM    [facility]

## 2019-09-06 ENCOUNTER — Encounter: Payer: Self-pay | Admitting: Cardiology

## 2019-09-06 ENCOUNTER — Other Ambulatory Visit: Payer: Self-pay

## 2019-09-06 ENCOUNTER — Ambulatory Visit: Payer: Medicare Other | Admitting: Cardiology

## 2019-09-06 VITALS — BP 90/64 | HR 57 | Ht 71.0 in | Wt 155.6 lb

## 2019-09-06 DIAGNOSIS — R110 Nausea: Secondary | ICD-10-CM | POA: Diagnosis not present

## 2019-09-06 DIAGNOSIS — I5032 Chronic diastolic (congestive) heart failure: Secondary | ICD-10-CM | POA: Diagnosis not present

## 2019-09-06 DIAGNOSIS — K449 Diaphragmatic hernia without obstruction or gangrene: Secondary | ICD-10-CM | POA: Diagnosis not present

## 2019-09-06 DIAGNOSIS — I2510 Atherosclerotic heart disease of native coronary artery without angina pectoris: Secondary | ICD-10-CM | POA: Diagnosis not present

## 2019-09-06 DIAGNOSIS — I951 Orthostatic hypotension: Secondary | ICD-10-CM | POA: Diagnosis not present

## 2019-09-06 DIAGNOSIS — E785 Hyperlipidemia, unspecified: Secondary | ICD-10-CM | POA: Diagnosis not present

## 2019-09-06 NOTE — Patient Instructions (Signed)
Medication Instructions:  No changes *If you need a refill on your cardiac medications before your next appointment, please call your pharmacy*  Lab Work: None needed this visit  Testing/Procedures: None needed this visit  Follow-Up: At [facility], you and your health needs are our priority.  As part of our continuing mission to provide you with exceptional heart care, we have created designated Provider Care Teams.  These Care Teams include your primary Cardiologist (physician) and Advanced Practice Providers (APPs -  Physician Assistants and Nurse Practitioners) who all work together to provide you with the care you need, when you need it.  We recommend signing up for the patient portal called "MyChart".  Sign up information is provided on this After Visit Summary.  MyChart is used to connect with patients for Virtual Visits (Telemedicine).  Patients are able to view lab/test results, encounter notes, upcoming appointments, etc.  Non-urgent messages can be sent to your provider as well.   To learn more about what you can do with MyChart, go to [email or web address].    Your next appointment:   1 year(s)  You will receive a reminder letter in the mail two months in advance. If you don't receive a letter, please call our office to schedule the follow-up appointment.  The format for your next appointment:   In Person  Provider:   James Hochrein, MD   

## 2019-09-15 ENCOUNTER — Other Ambulatory Visit: Payer: Self-pay | Admitting: Cardiology

## 2019-09-15 DIAGNOSIS — I2510 Atherosclerotic heart disease of native coronary artery without angina pectoris: Secondary | ICD-10-CM

## 2019-09-15 DIAGNOSIS — Z951 Presence of aortocoronary bypass graft: Secondary | ICD-10-CM

## 2019-10-12 DIAGNOSIS — R972 Elevated prostate specific antigen [PSA]: Secondary | ICD-10-CM | POA: Diagnosis not present

## 2019-10-12 DIAGNOSIS — Z0000 Encounter for general adult medical examination without abnormal findings: Secondary | ICD-10-CM | POA: Diagnosis not present

## 2019-10-12 DIAGNOSIS — Z125 Encounter for screening for malignant neoplasm of prostate: Secondary | ICD-10-CM | POA: Diagnosis not present

## 2019-10-12 DIAGNOSIS — I2510 Atherosclerotic heart disease of native coronary artery without angina pectoris: Secondary | ICD-10-CM | POA: Diagnosis not present

## 2019-10-12 DIAGNOSIS — E785 Hyperlipidemia, unspecified: Secondary | ICD-10-CM | POA: Diagnosis not present

## 2019-10-29 ENCOUNTER — Other Ambulatory Visit: Payer: Self-pay | Admitting: Adult Health

## 2019-11-25 DIAGNOSIS — Z0120 Encounter for dental examination and cleaning without abnormal findings: Secondary | ICD-10-CM | POA: Diagnosis not present

## 2020-01-18 DIAGNOSIS — H26491 Other secondary cataract, right eye: Secondary | ICD-10-CM | POA: Diagnosis not present

## 2020-01-18 DIAGNOSIS — H401131 Primary open-angle glaucoma, bilateral, mild stage: Secondary | ICD-10-CM | POA: Diagnosis not present

## 2020-02-26 DIAGNOSIS — Z23 Encounter for immunization: Secondary | ICD-10-CM | POA: Diagnosis not present

## 2020-02-29 DIAGNOSIS — L821 Other seborrheic keratosis: Secondary | ICD-10-CM | POA: Diagnosis not present

## 2020-02-29 DIAGNOSIS — D1801 Hemangioma of skin and subcutaneous tissue: Secondary | ICD-10-CM | POA: Diagnosis not present

## 2020-02-29 DIAGNOSIS — L814 Other melanin hyperpigmentation: Secondary | ICD-10-CM | POA: Diagnosis not present

## 2020-02-29 DIAGNOSIS — D225 Melanocytic nevi of trunk: Secondary | ICD-10-CM | POA: Diagnosis not present

## 2020-02-29 DIAGNOSIS — L820 Inflamed seborrheic keratosis: Secondary | ICD-10-CM | POA: Diagnosis not present

## 2020-03-09 DIAGNOSIS — H26491 Other secondary cataract, right eye: Secondary | ICD-10-CM | POA: Diagnosis not present

## 2020-03-19 IMAGING — US US EXTREM LOW VENOUS*L*
1 series · 13 of 24 positions shown · non-contrast
Comparison: None.

CLINICAL DATA: Leg pain.  Palpable nodule in calf.



[Series 1: us extrem low venous*left* · 37 acquisitions, 13 frames shown]
[im 1/37]
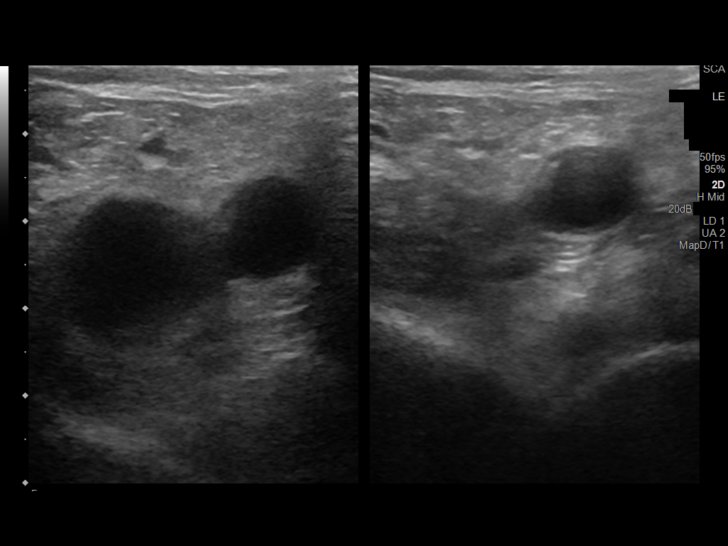
[im 4/37]
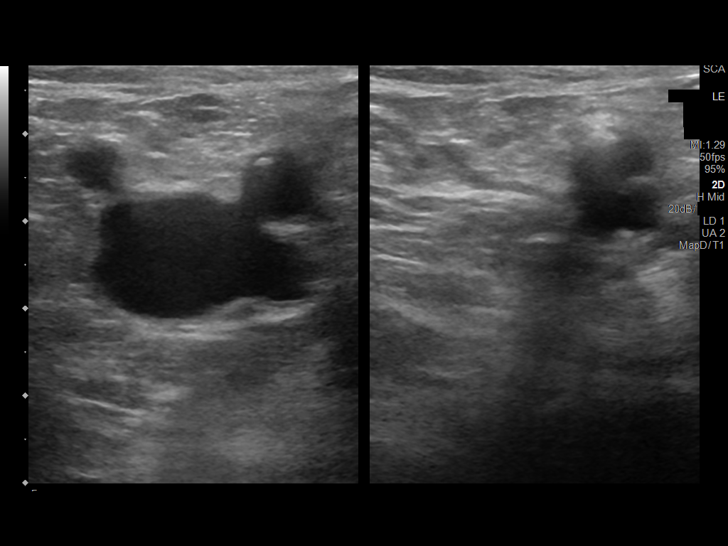
[im 7/37]
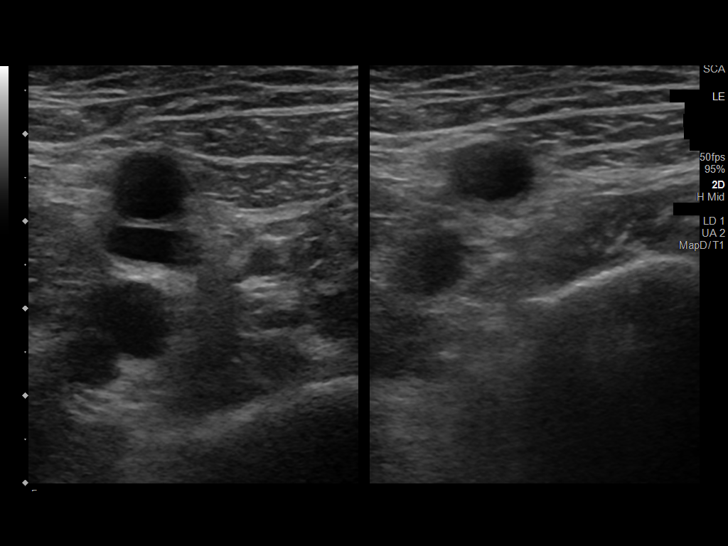
[im 11/37]
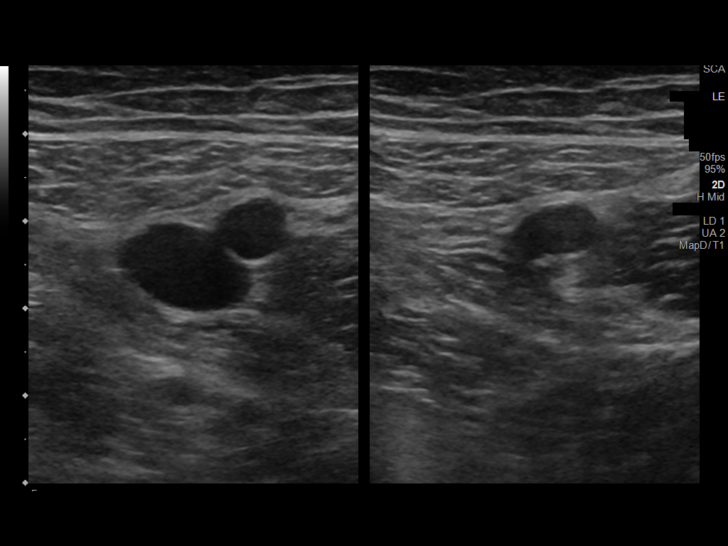
[im 15/37]
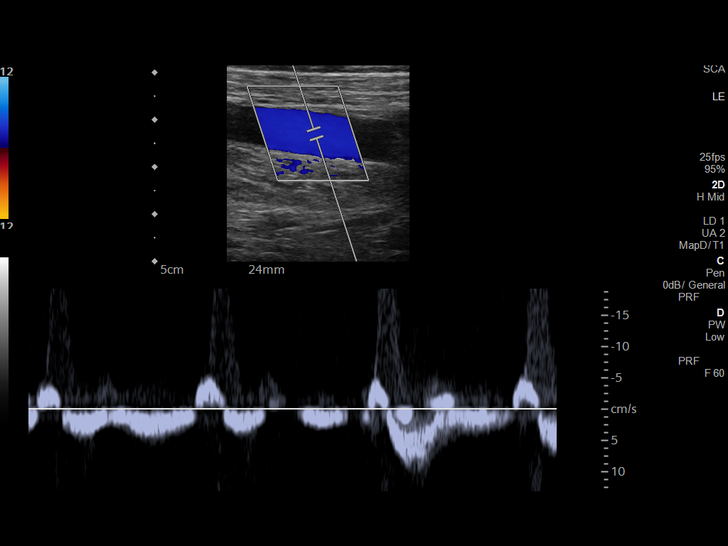
[im 18/37]
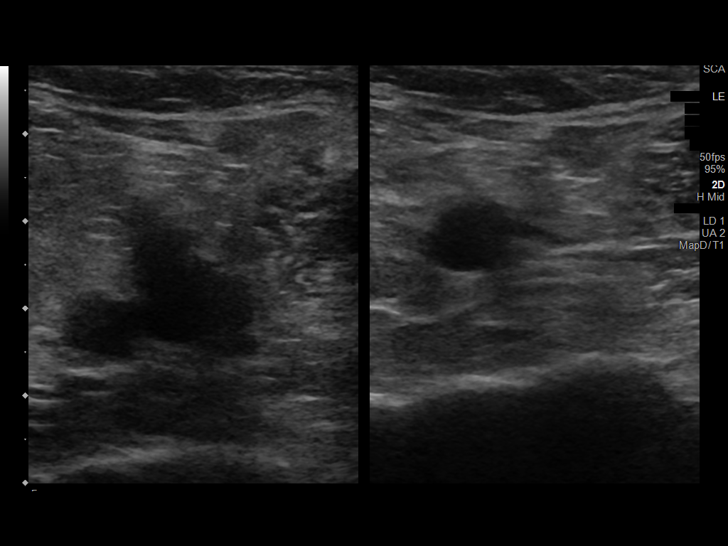
[im 21/37]
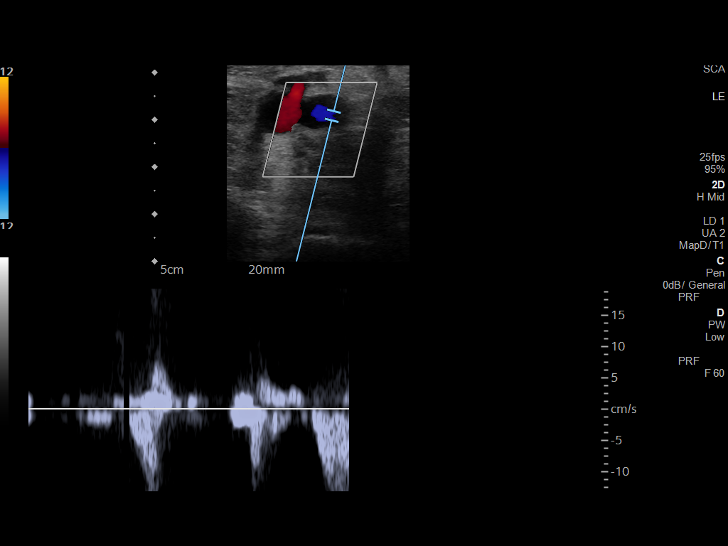
[im 22/37]
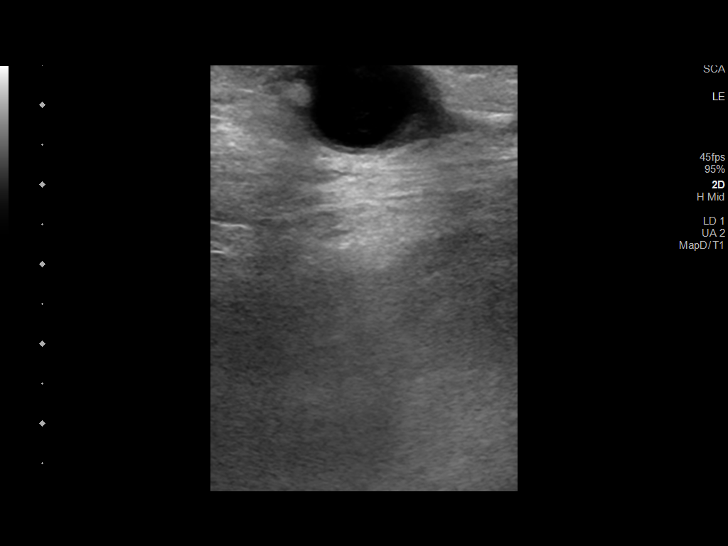
[im 26/37]
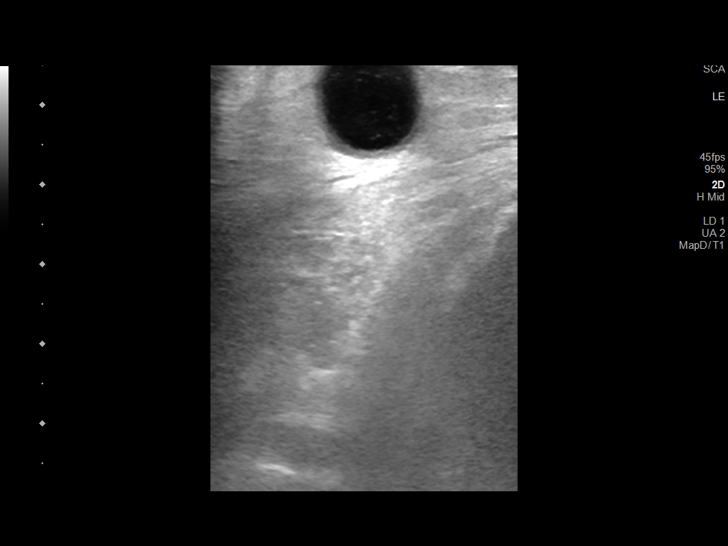
[im 27/37]
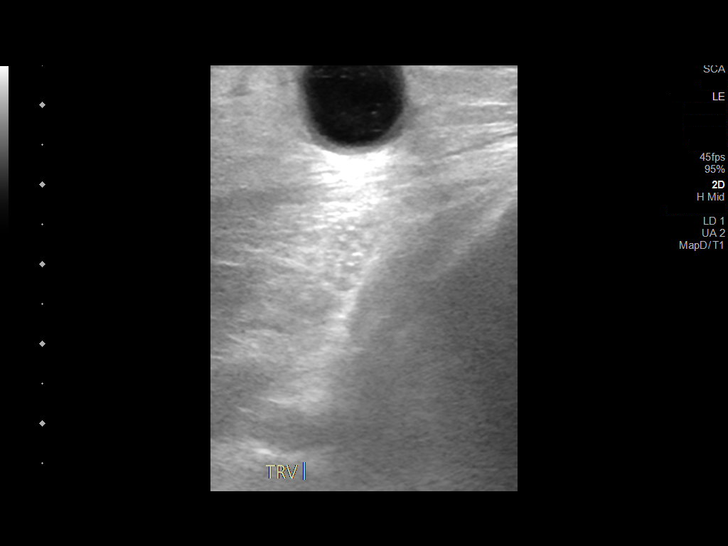
[im 30/37]
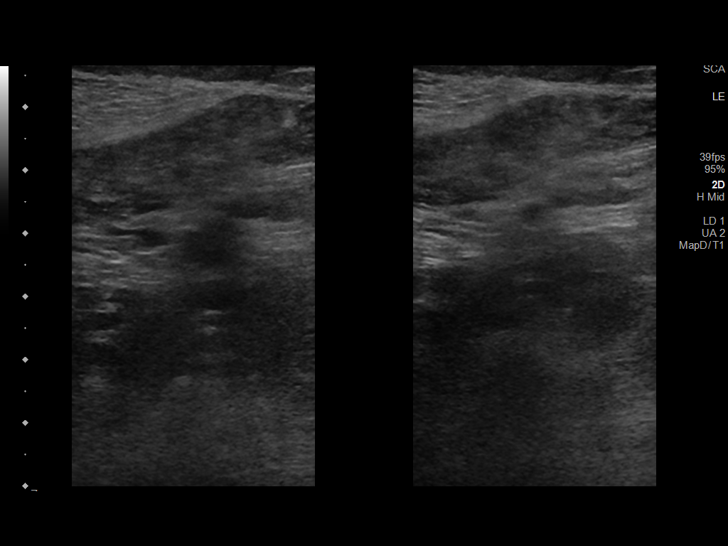
[im 33/37]
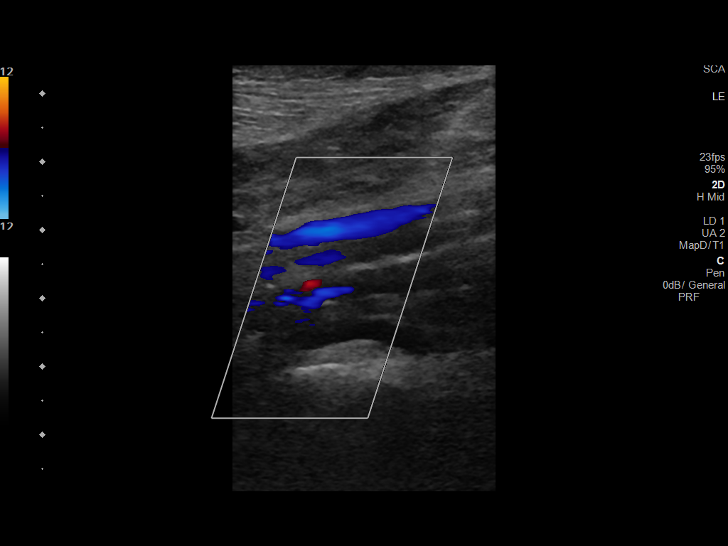
[im 37/37]
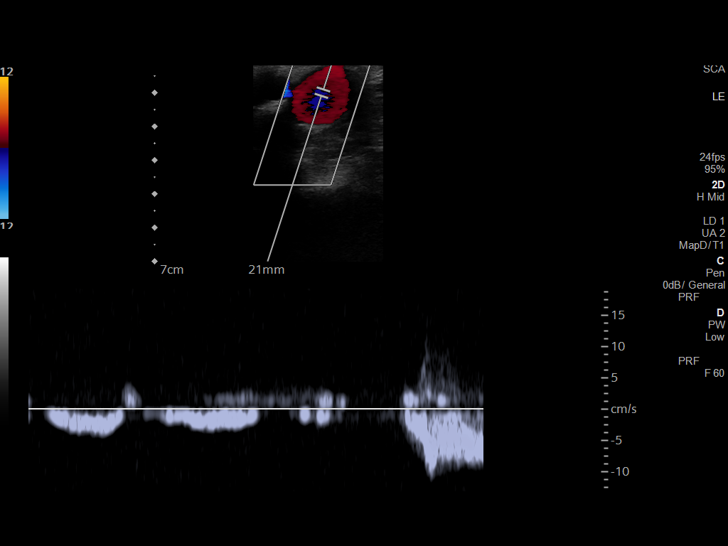

[13 of 24 positions shown; findings below may reference images not displayed]

FINDINGS: Contralateral Common Femoral Vein: Respiratory phasicity is normal
and symmetric with the symptomatic side. No evidence of thrombus.
Normal compressibility.

Common Femoral Vein: No evidence of thrombus. Normal
compressibility, respiratory phasicity and response to augmentation.

Saphenofemoral Junction: No evidence of thrombus. Normal
compressibility and flow on color Doppler imaging.

Profunda Femoral Vein: No evidence of thrombus. Normal
compressibility and flow on color Doppler imaging.

Femoral Vein: No evidence of thrombus. Normal compressibility,
respiratory phasicity and response to augmentation.

Popliteal Vein: No evidence of thrombus. Normal compressibility,
respiratory phasicity and response to augmentation.

Calf Veins: No evidence of thrombus. Normal compressibility and flow
on color Doppler imaging.

Superficial Great Saphenous Vein: No evidence of thrombus. Normal
compressibility.

Other Findings: Rounded hypoechoic nodule in the upper calf
measuring 1.8 x 1.4 cm corresponds to the palpable lesion. This
lesion has no blood flow.
IMPRESSION: 1. No evidence deep venous thrombosis in LEFT lower extremity.
2. Rounded hypoechoic nodule in the upper calf without blood flow.
Favor chronic hematoma versus thrombosed superficial vessel.

## 2020-03-28 DIAGNOSIS — Z0120 Encounter for dental examination and cleaning without abnormal findings: Secondary | ICD-10-CM | POA: Diagnosis not present

## 2020-06-23 DIAGNOSIS — E785 Hyperlipidemia, unspecified: Secondary | ICD-10-CM | POA: Diagnosis not present

## 2020-06-23 DIAGNOSIS — Z0000 Encounter for general adult medical examination without abnormal findings: Secondary | ICD-10-CM | POA: Diagnosis not present

## 2020-06-23 DIAGNOSIS — I2510 Atherosclerotic heart disease of native coronary artery without angina pectoris: Secondary | ICD-10-CM | POA: Diagnosis not present

## 2020-06-23 DIAGNOSIS — H409 Unspecified glaucoma: Secondary | ICD-10-CM | POA: Diagnosis not present

## 2020-07-25 DIAGNOSIS — H401131 Primary open-angle glaucoma, bilateral, mild stage: Secondary | ICD-10-CM | POA: Diagnosis not present

## 2020-07-25 DIAGNOSIS — Z961 Presence of intraocular lens: Secondary | ICD-10-CM | POA: Diagnosis not present

## 2020-09-04 NOTE — Progress Notes (Signed)
Cardiology Office Note   Date:  09/05/2020   ID:  Brian Terry, DOB [birth year], MRN [id number]  PCP:  Kristen Loader, FNP  Cardiologist:   Minus Breeding, MD   Chief Complaint  Patient presents with  . Coronary Artery Disease      History of Present Illness: Brian Terry is a 80 y.o. male who presents follow up of CAD/CABG.    Since I last saw him he has done well from a cardiac standpoint.  He denies any new cardiovascular symptoms.  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  He walks about 2 miles a day. The patient denies any new symptoms such as chest discomfort, neck or arm discomfort. There has been no new shortness of breath, PND or orthopnea. There have been no reported palpitations, presyncope or syncope. Of note he never really had any anginal symptoms prior to that.  He does occasionally get some bilateral arm numbness but this is a transient.  This starts in his shoulders and happens sporadically.   Past Medical History:  Diagnosis Date  . GERD (gastroesophageal reflux disease)   . Glaucoma   . Hemorrhoids   . Hyperlipidemia     Past Surgical History:  Procedure Laterality Date  . BALLOON DILATION N/A 03/01/2015   Procedure: BALLOON DILATION;  Surgeon: [name], MD;  Location: [facility];  Service: Endoscopy;  Laterality: N/A;  . CATARACT EXTRACTION    . CORONARY ARTERY BYPASS GRAFT N/A 07/16/2018   Procedure: CORONARY ARTERY BYPASS GRAFTING (CABG), ON PUMP, TIMES THREE, USING LEFT INTERNAL MAMMARY ARTERY AND LEFT GREATER SAHENOUS VEIN HARVESTED ENDOSCOPICALLY;  Surgeon: [name], MD;  Location: [facility];  Service: Open Heart Surgery;  Laterality: N/A;  . ESOPHAGOGASTRODUODENOSCOPY N/A 03/01/2015   Procedure: ESOPHAGOGASTRODUODENOSCOPY (EGD);  Surgeon: [name], MD;  Location: [facility];  Service: Endoscopy;  Laterality: N/A;  . HERNIA REPAIR Left 2010   inguinal   . LEFT HEART CATH AND CORONARY ANGIOGRAPHY N/A 07/06/2018   Procedure:  LEFT HEART CATH AND CORONARY ANGIOGRAPHY;  Surgeon: [name], MD;  Location: [facility];  Service: Cardiovascular;  Laterality: N/A;  . RETINAL DETACHMENT SURGERY Right 2000  . TEE WITHOUT CARDIOVERSION N/A 07/16/2018   Procedure: TRANSESOPHAGEAL ECHOCARDIOGRAM (TEE);  Surgeon: [name], MD;  Location: [facility];  Service: Open Heart Surgery;  Laterality: N/A;  . vein removal Right    R leg     Current Outpatient Medications  Medication Sig Dispense Refill  . Acetaminophen (TYLENOL ARTHRITIS PAIN PO) Take 650 mg by mouth daily.    . Ascorbic Acid (VITAMIN C WITH ROSE HIPS) 1000 MG tablet Take 1,000 mg by mouth daily.    Marland Kitchen aspirin EC 81 MG tablet Take 81 mg by mouth daily.    Marland Kitchen ezetimibe (ZETIA) 10 MG tablet Take 1 tablet (10 mg total) by mouth daily. 90 tablet 3  . Ferrous Sulfate (IRON) 325 (65 Fe) MG TABS Take 1 tablet by mouth daily.    . furosemide (LASIX) 20 MG tablet Take 20 mg by mouth as needed.    . midodrine (PROAMATINE) 10 MG tablet TAKE  (1)  TABLET  THREE TIMES DAILY AFTER MEALS. 270 tablet 3  . Multiple Vitamin (MULTIVITAMIN WITH MINERALS) TABS tablet Take 1 tablet by mouth daily.     . nitroGLYCERIN (NITROSTAT) 0.4 MG SL tablet Place 1 tablet (0.4 mg total) under the tongue every 5 (five) minutes as needed for chest pain. [place]  tablet prn  . Psyllium (METAMUCIL PO) Take 2-4 capsules by mouth daily.     . rosuvastatin (CRESTOR) 40 MG tablet Take 1 tablet by mouth daily.    . tamsulosin (FLOMAX) 0.4 MG CAPS capsule Take 0.4 mg by mouth daily after supper. 30 minutes after supper.    . timolol (TIMOPTIC) 0.5 % ophthalmic solution Place 1 drop into both eyes 2 (two) times daily.   98   No current facility-administered medications for this visit.    Allergies:   Sulfa antibiotics    ROS:  Please see the history of present illness.   Otherwise, review of systems are positive for none.   All other systems are reviewed and negative.    PHYSICAL EXAM: VS:  BP  104/62   Pulse (!) 49   Ht 5\' 11"  (1.803 m)   Wt 157 lb (71.2 kg)   BMI 21.90 kg/m  , BMI Body mass index is 21.9 kg/m. GENERAL:  Well appearing NECK:  No jugular venous distention, waveform within normal limits, carotid upstroke brisk and symmetric, no bruits, no thyromegaly LUNGS:  Clear to auscultation bilaterally CHEST:  Well healed sternotomy scar. HEART:  PMI not displaced or sustained,S1 and S2 within normal limits, no S3, no S4, no clicks, no rubs, no murmurs ABD:  Flat, positive bowel sounds normal in frequency in pitch, no bruits, no rebound, no guarding, no midline pulsatile mass, no hepatomegaly, no splenomegaly EXT:  2 plus pulses throughout, no edema, no cyanosis no clubbing   EKG:  EKG is  ordered today. The ekg ordered today demonstrates sinus bradycardia, rate 49, left axis deviation, no acute ST-T wave changes.   Recent Labs: No results found for requested labs within last 8760 hours.    Lipid Panel    Component Value Date/Time   CHOL 171 02/25/2019 0820   TRIG 57 02/25/2019 0820   HDL 83 02/25/2019 0820   CHOLHDL 2.1 02/25/2019 0820   CHOLHDL 3.6 CALC 10/24/2006 1158   VLDL 16 10/24/2006 1158   LDLCALC 77 02/25/2019 0820   LDLDIRECT 164.4 10/24/2006 1158      Wt Readings from Last 3 Encounters:  09/05/20 157 lb (71.2 kg)  09/06/19 155 lb 9.6 oz (70.6 kg)  08/13/19 151 lb (68.5 kg)      Other studies Reviewed: Additional studies/ records that were reviewed today include: Labs. Review of the above records demonstrates:  See below.   ASSESSMENT AND PLAN:  CADs/pCABG:    The patient has no new sypmtoms.  No further cardiovascular testing is indicated.  We will continue with aggressive risk reduction and meds as listed.  Hyperlipidemia:   His LDL was 79.  And then add Zetia 10 mg daily and he can get lipid and liver profile in about 10 weeks.   Orthostatic hypotension:  He is not having symptoms related to this is much.  We discussed possibility  the contribution of tamsulosin to this.  His physicians considering discontinuing this.     Current medicines are reviewed at length with the patient today.  The patient does not have concerns regarding medicines.  The following changes have been made:  None  Labs/ tests ordered today include:  None  Orders Placed This Encounter  Procedures  . Lipid panel  . Hepatic function panel  . EKG 12-Lead     Disposition:   FU with me in 12  months.     Signed, Minus Breeding, MD  09/05/2020 3:49 PM  [facility]

## 2020-09-05 ENCOUNTER — Other Ambulatory Visit: Payer: Self-pay

## 2020-09-05 ENCOUNTER — Encounter: Payer: Self-pay | Admitting: Cardiology

## 2020-09-05 ENCOUNTER — Ambulatory Visit: Payer: Medicare Other | Admitting: Cardiology

## 2020-09-05 VITALS — BP 104/62 | HR 49 | Ht 71.0 in | Wt 157.0 lb

## 2020-09-05 DIAGNOSIS — E785 Hyperlipidemia, unspecified: Secondary | ICD-10-CM | POA: Diagnosis not present

## 2020-09-05 DIAGNOSIS — Z79899 Other long term (current) drug therapy: Secondary | ICD-10-CM | POA: Diagnosis not present

## 2020-09-05 DIAGNOSIS — I2510 Atherosclerotic heart disease of native coronary artery without angina pectoris: Secondary | ICD-10-CM | POA: Diagnosis not present

## 2020-09-05 DIAGNOSIS — I951 Orthostatic hypotension: Secondary | ICD-10-CM

## 2020-09-05 MED ORDER — EZETIMIBE 10 MG PO TABS: 10.0000 mg | ORAL_TABLET | Freq: Every day | ORAL | 3 refills | Status: DC

## 2020-09-05 NOTE — Patient Instructions (Signed)
Medication Instructions:  START- Zetia 10 mg by mouth daily  *If you need a refill on your cardiac medications before your next appointment, please call your pharmacy*   Lab Work: Fasting Lipid and Liver  If you have labs (blood work) drawn today and your tests are completely normal, you will receive your results only by: Marland Kitchen MyChart Message (if you have MyChart) OR . A paper copy in the mail If you have any lab test that is abnormal or we need to change your treatment, we will call you to review the results.   Testing/Procedures: None Ordered   Follow-Up: At [facility], you and your health needs are our priority.  As part of our continuing mission to provide you with exceptional heart care, we have created designated Provider Care Teams.  These Care Teams include your primary Cardiologist (physician) and Advanced Practice Providers (APPs -  Physician Assistants and Nurse Practitioners) who all work together to provide you with the care you need, when you need it.  We recommend signing up for the patient portal called "MyChart".  Sign up information is provided on this After Visit Summary.  MyChart is used to connect with patients for Virtual Visits (Telemedicine).  Patients are able to view lab/test results, encounter notes, upcoming appointments, etc.  Non-urgent messages can be sent to your provider as well.   To learn more about what you can do with MyChart, go to [email or web address].    Your next appointment:   1 year(s)  The format for your next appointment:   In Person  Provider:   You may see Minus Breeding, MD or one of the following Advanced Practice Providers on your designated Care Team:    Rosaria Ferries, PA-C  Jory Sims, DNP, ANP

## 2020-09-12 DIAGNOSIS — N400 Enlarged prostate without lower urinary tract symptoms: Secondary | ICD-10-CM | POA: Diagnosis not present

## 2020-09-12 DIAGNOSIS — D696 Thrombocytopenia, unspecified: Secondary | ICD-10-CM | POA: Diagnosis not present

## 2020-09-12 DIAGNOSIS — Z111 Encounter for screening for respiratory tuberculosis: Secondary | ICD-10-CM | POA: Diagnosis not present

## 2020-09-18 ENCOUNTER — Other Ambulatory Visit: Payer: Self-pay | Admitting: Cardiology

## 2020-09-18 DIAGNOSIS — I2510 Atherosclerotic heart disease of native coronary artery without angina pectoris: Secondary | ICD-10-CM

## 2020-09-18 DIAGNOSIS — Z951 Presence of aortocoronary bypass graft: Secondary | ICD-10-CM

## 2020-09-18 NOTE — Telephone Encounter (Signed)
Rx has been sent to the pharmacy electronically. ° °

## 2020-10-23 ENCOUNTER — Telehealth: Payer: Self-pay | Admitting: *Deleted

## 2020-10-23 NOTE — Telephone Encounter (Signed)
Patient walked into the office this morning asking to be checked. He reports last night he took his evening dose of midodrine later than usual and prior to taking the medication he felt a little lightheaded. He continued to feel a little lightheaded this morning but after eating and taking his medications he currently feels fine. He denies any chest pain or SOB during his episode. His blood pressure is 90/60. He will continue to monitor and let us know if he has any other issues. encourged patient to increase fluids. Pt agreed with this plan.

## 2020-10-24 DIAGNOSIS — H6123 Impacted cerumen, bilateral: Secondary | ICD-10-CM | POA: Diagnosis not present

## 2020-12-06 DIAGNOSIS — E785 Hyperlipidemia, unspecified: Secondary | ICD-10-CM | POA: Diagnosis not present

## 2020-12-06 DIAGNOSIS — Z0000 Encounter for general adult medical examination without abnormal findings: Secondary | ICD-10-CM | POA: Diagnosis not present

## 2020-12-06 DIAGNOSIS — D696 Thrombocytopenia, unspecified: Secondary | ICD-10-CM | POA: Diagnosis not present

## 2020-12-06 DIAGNOSIS — Z111 Encounter for screening for respiratory tuberculosis: Secondary | ICD-10-CM | POA: Diagnosis not present

## 2020-12-07 DIAGNOSIS — E785 Hyperlipidemia, unspecified: Secondary | ICD-10-CM | POA: Diagnosis not present

## 2020-12-07 DIAGNOSIS — Z79899 Other long term (current) drug therapy: Secondary | ICD-10-CM | POA: Diagnosis not present

## 2020-12-07 DIAGNOSIS — I2510 Atherosclerotic heart disease of native coronary artery without angina pectoris: Secondary | ICD-10-CM | POA: Diagnosis not present

## 2020-12-07 LAB — HEPATIC FUNCTION PANEL
ALT: 35 IU/L (ref 0–44)
AST: 40 IU/L (ref 0–40)
Albumin: 4 g/dL (ref 3.7–4.7)
Alkaline Phosphatase: 57 IU/L (ref 44–121)
Bilirubin Total: 1.7 mg/dL — ABNORMAL HIGH (ref 0.0–1.2)
Bilirubin, Direct: 0.43 mg/dL — ABNORMAL HIGH (ref 0.00–0.40)
Total Protein: 6.2 g/dL (ref 6.0–8.5)

## 2020-12-07 LAB — LIPID PANEL
Chol/HDL Ratio: 2.1 ratio (ref 0.0–5.0)
Cholesterol, Total: 142 mg/dL (ref 100–199)
HDL: 67 mg/dL (ref >39)
LDL Chol Calc (NIH): 65 mg/dL (ref 0–99)
Triglycerides: 43 mg/dL (ref 0–149)
VLDL Cholesterol Cal: 10 mg/dL (ref 5–40)

## 2020-12-19 ENCOUNTER — Other Ambulatory Visit: Payer: Self-pay | Admitting: Adult Health

## 2021-02-01 DIAGNOSIS — Z961 Presence of intraocular lens: Secondary | ICD-10-CM | POA: Diagnosis not present

## 2021-02-01 DIAGNOSIS — H401131 Primary open-angle glaucoma, bilateral, mild stage: Secondary | ICD-10-CM | POA: Diagnosis not present

## 2021-03-06 DIAGNOSIS — L821 Other seborrheic keratosis: Secondary | ICD-10-CM | POA: Diagnosis not present

## 2021-03-06 DIAGNOSIS — L814 Other melanin hyperpigmentation: Secondary | ICD-10-CM | POA: Diagnosis not present

## 2021-03-06 DIAGNOSIS — D485 Neoplasm of uncertain behavior of skin: Secondary | ICD-10-CM | POA: Diagnosis not present

## 2021-03-06 DIAGNOSIS — B079 Viral wart, unspecified: Secondary | ICD-10-CM | POA: Diagnosis not present

## 2021-03-06 DIAGNOSIS — D225 Melanocytic nevi of trunk: Secondary | ICD-10-CM | POA: Diagnosis not present

## 2021-04-11 ENCOUNTER — Encounter: Payer: Self-pay | Admitting: Gastroenterology

## 2021-06-07 ENCOUNTER — Other Ambulatory Visit: Payer: Self-pay | Admitting: Cardiology

## 2021-08-03 DIAGNOSIS — H401131 Primary open-angle glaucoma, bilateral, mild stage: Secondary | ICD-10-CM | POA: Diagnosis not present

## 2021-08-08 DIAGNOSIS — N401 Enlarged prostate with lower urinary tract symptoms: Secondary | ICD-10-CM | POA: Diagnosis not present

## 2021-08-08 DIAGNOSIS — I2510 Atherosclerotic heart disease of native coronary artery without angina pectoris: Secondary | ICD-10-CM | POA: Diagnosis not present

## 2021-08-08 DIAGNOSIS — I5032 Chronic diastolic (congestive) heart failure: Secondary | ICD-10-CM | POA: Diagnosis not present

## 2021-08-08 DIAGNOSIS — I951 Orthostatic hypotension: Secondary | ICD-10-CM | POA: Diagnosis not present

## 2021-09-04 ENCOUNTER — Other Ambulatory Visit: Payer: Self-pay | Admitting: Cardiology

## 2021-09-04 DIAGNOSIS — Z951 Presence of aortocoronary bypass graft: Secondary | ICD-10-CM

## 2021-09-04 DIAGNOSIS — I2510 Atherosclerotic heart disease of native coronary artery without angina pectoris: Secondary | ICD-10-CM

## 2021-11-04 NOTE — Progress Notes (Unsigned)
Cardiology Office Note   Date:  11/05/2021   ID:  [name], DOB 03-11-1941, MRN [id number]  PCP:  Javier Glazier, MD  Cardiologist:   Minus Breeding, MD   Chief Complaint  Patient presents with   Coronary Artery Disease      History of Present Illness: Brian Terry is a 81 y.o. male who presents follow up of CAD/CABG.    since I last saw him he has done well.  He lives at [place] and he does walking.  He also golfs but he rides a cart. The patient denies any new symptoms such as chest discomfort, neck or arm discomfort. There has been no new shortness of breath, PND or orthopnea. There have been no reported palpitations, presyncope or syncope.   He does have some ankle edema and uses Lasix about every 10 days.  He has orthostatic hypotension that is well controlled with midodrine and has had no symptoms.  He never really had any symptoms prior to his bypass either.   Past Medical History:  Diagnosis Date   GERD (gastroesophageal reflux disease)    Glaucoma    Hemorrhoids    Hyperlipidemia     Past Surgical History:  Procedure Laterality Date   BALLOON DILATION N/A 03/01/2015   Procedure: BALLOON DILATION;  Surgeon: [name], MD;  Location: [facility];  Service: Endoscopy;  Laterality: N/A;   CATARACT EXTRACTION     CORONARY ARTERY BYPASS GRAFT N/A 07/16/2018   Procedure: CORONARY ARTERY BYPASS GRAFTING (CABG), ON PUMP, TIMES THREE, USING LEFT INTERNAL MAMMARY ARTERY AND LEFT GREATER SAHENOUS VEIN HARVESTED ENDOSCOPICALLY;  Surgeon: [name], MD;  Location: [facility] [place];  Service: Open Heart Surgery;  Laterality: N/A;   ESOPHAGOGASTRODUODENOSCOPY N/A 03/01/2015   Procedure: ESOPHAGOGASTRODUODENOSCOPY (EGD);  Surgeon: [name], MD;  Location: [facility];  Service: Endoscopy;  Laterality: N/A;   HERNIA REPAIR Left 2010   inguinal    LEFT HEART CATH AND CORONARY ANGIOGRAPHY N/A 07/06/2018   Procedure: LEFT HEART CATH AND CORONARY ANGIOGRAPHY;  Surgeon:  [name], MD;  Location: [facility];  Service: Cardiovascular;  Laterality: N/A;   RETINAL DETACHMENT SURGERY Right 2000   TEE WITHOUT CARDIOVERSION N/A 07/16/2018   Procedure: TRANSESOPHAGEAL ECHOCARDIOGRAM (TEE);  Surgeon: [name], MD;  Location: [facility];  Service: Open Heart Surgery;  Laterality: N/A;   vein removal Right    R leg     Current Outpatient Medications  Medication Sig Dispense Refill   Acetaminophen (TYLENOL ARTHRITIS PAIN PO) Take 650 mg by mouth daily.     Ascorbic Acid (VITAMIN C WITH ROSE HIPS) 1000 MG tablet Take 1,000 mg by mouth daily.     aspirin EC 81 MG tablet Take 81 mg by mouth daily.     ezetimibe (ZETIA) 10 MG tablet TAKE ONE TABLET BY MOUTH DAILY. 90 tablet 3   Ferrous Sulfate (IRON) 325 (65 Fe) MG TABS Take 1 tablet by mouth daily.     furosemide (LASIX) 20 MG tablet TAKE 1 TABLET ONCE DAILY FOR 3 DAYS. 30 tablet 11   midodrine (PROAMATINE) 10 MG tablet TAKE ONE TABLET THREE TIMES DAILY AFTER MEALS. 90 tablet 1   Multiple Vitamin (MULTIVITAMIN WITH MINERALS) TABS tablet Take 1 tablet by mouth daily.      rosuvastatin (CRESTOR) 40 MG tablet TAKE ONE TABLET BY MOUTH ONCE DAILY 90 tablet 1   tamsulosin (FLOMAX) 0.4 MG CAPS capsule Take 0.4 mg by mouth daily after supper.  30 minutes after supper.     timolol (TIMOPTIC) 0.5 % ophthalmic solution Place 1 drop into both eyes 2 (two) times daily.   98   nitroGLYCERIN (NITROSTAT) 0.4 MG SL tablet Place 1 tablet (0.4 mg total) under the tongue every 5 (five) minutes as needed for chest pain. 30 tablet prn   No current facility-administered medications for this visit.    Allergies:   Sulfa antibiotics    ROS:  Please see the history of present illness.   Otherwise, review of systems are positive for none.   All other systems are reviewed and negative.    PHYSICAL EXAM: VS:  BP 110/70   Pulse (!) 42   Ht '5\' 11"'$  (1.803 m)   Wt 157 lb 12.8 oz (71.6 kg)   SpO2 98%   BMI 22.01 kg/m  , BMI  Body mass index is 22.01 kg/m. GENERAL:  Well appearing NECK:  No jugular venous distention, waveform within normal limits, carotid upstroke brisk and symmetric, no bruits, no thyromegaly LUNGS:  Clear to auscultation bilaterally CHEST:  Well healed sternotomy scar. HEART:  PMI not displaced or sustained,S1 and S2 within normal limits, no S3, no S4, no clicks, no rubs, soft apical systolic murmurs ABD:  Flat, positive bowel sounds normal in frequency in pitch, no bruits, no rebound, no guarding, no midline pulsatile mass, no hepatomegaly, no splenomegaly EXT:  2 plus pulses throughout, no edema, no cyanosis no clubbing  EKG:  EKG is  ordered today. The ekg ordered today demonstrates sinus bradycardia, rate 42, left axis deviation, no acute ST-T wave changes.   Recent Labs: 12/07/2020: ALT 35    Lipid Panel    Component Value Date/Time   CHOL 142 12/07/2020 0810   TRIG 43 12/07/2020 0810   HDL 67 12/07/2020 0810   CHOLHDL 2.1 12/07/2020 0810   CHOLHDL 3.6 CALC 10/24/2006 1158   VLDL 16 10/24/2006 1158   LDLCALC 65 12/07/2020 0810   LDLDIRECT 164.4 10/24/2006 1158      Wt Readings from Last 3 Encounters:  11/05/21 157 lb 12.8 oz (71.6 kg)  09/05/20 157 lb (71.2 kg)  09/06/19 155 lb 9.6 oz (70.6 kg)      Other studies Reviewed: Additional studies/ records that were reviewed today include: Echo 2020, labs. Review of the above records demonstrates:  See below.   ASSESSMENT AND PLAN:  CAD s/p CABG:  The patient has no new sypmtoms.  No further cardiovascular testing is indicated.  We will continue with aggressive risk reduction and meds as listed.  Hyperlipidemia:   His LDL was 65 with an HDL of 67.  No change in therapy.   Orthostatic hypotension: He tolerates this with his midodrine.  No change in therapy.   MR: He did have some mild mitral regurgitation and some aortic stenosis on echo prior to his bypass in 2020.  I will repeat an echocardiogram.  Bradycardia: He has  no symptoms related to this.  No change in therapy.    Current medicines are reviewed at length with the patient today.  The patient does not have concerns regarding medicines.  The following changes have been made:  None  Labs/ tests ordered today include:     Orders Placed This Encounter  Procedures   EKG 12-Lead   ECHOCARDIOGRAM COMPLETE     Disposition:   FU with me in 12  months.     Signed, Minus Breeding, MD  11/05/2021 12:17 PM    [facility]

## 2021-11-05 ENCOUNTER — Ambulatory Visit: Payer: Medicare Other | Admitting: Cardiology

## 2021-11-05 ENCOUNTER — Encounter: Payer: Self-pay | Admitting: Cardiology

## 2021-11-05 VITALS — BP 110/70 | HR 42 | Ht 71.0 in | Wt 157.8 lb

## 2021-11-05 DIAGNOSIS — E785 Hyperlipidemia, unspecified: Secondary | ICD-10-CM | POA: Diagnosis not present

## 2021-11-05 DIAGNOSIS — I340 Nonrheumatic mitral (valve) insufficiency: Secondary | ICD-10-CM | POA: Diagnosis not present

## 2021-11-05 DIAGNOSIS — I2510 Atherosclerotic heart disease of native coronary artery without angina pectoris: Secondary | ICD-10-CM

## 2021-11-05 DIAGNOSIS — I951 Orthostatic hypotension: Secondary | ICD-10-CM | POA: Diagnosis not present

## 2021-11-05 DIAGNOSIS — I350 Nonrheumatic aortic (valve) stenosis: Secondary | ICD-10-CM

## 2021-11-05 NOTE — Patient Instructions (Signed)
Medication Instructions:  Your Physician recommend you continue on your current medication as directed.    *If you need a refill on your cardiac medications before your next appointment, please call your pharmacy*   Lab Work: None ordered today   Testing/Procedures: Your physician has requested that you have an echocardiogram. Echocardiography is a painless test that uses sound waves to create images of your heart. It provides your doctor with information about the size and shape of your heart and how well your heart's chambers and valves are working. This procedure takes approximately one hour. There are no restrictions for this procedure. [place] [address]    Follow-Up: At [facility], you and your health needs are our priority.  As part of our continuing mission to provide you with exceptional heart care, we have created designated Provider Care Teams.  These Care Teams include your primary Cardiologist (physician) and Advanced Practice Providers (APPs -  Physician Assistants and Nurse Practitioners) who all work together to provide you with the care you need, when you need it.  We recommend signing up for the patient portal called "MyChart".  Sign up information is provided on this After Visit Summary.  MyChart is used to connect with patients for Virtual Visits (Telemedicine).  Patients are able to view lab/test results, encounter notes, upcoming appointments, etc.  Non-urgent messages can be sent to your provider as well.   To learn more about what you can do with MyChart, go to [email or web address].    Your next appointment:   1 year(s)  The format for your next appointment:   In Person  Provider:   Minus Breeding, MD

## 2021-11-16 ENCOUNTER — Ambulatory Visit: Payer: Medicare Other | Admitting: Cardiology

## 2021-11-22 DIAGNOSIS — R531 Weakness: Secondary | ICD-10-CM | POA: Diagnosis not present

## 2021-11-22 DIAGNOSIS — E039 Hypothyroidism, unspecified: Secondary | ICD-10-CM | POA: Diagnosis not present

## 2021-11-22 DIAGNOSIS — I10 Essential (primary) hypertension: Secondary | ICD-10-CM | POA: Diagnosis not present

## 2021-11-22 DIAGNOSIS — E785 Hyperlipidemia, unspecified: Secondary | ICD-10-CM | POA: Diagnosis not present

## 2021-11-23 ENCOUNTER — Ambulatory Visit (HOSPITAL_COMMUNITY): Payer: Medicare Other | Attending: Cardiology

## 2021-11-23 ENCOUNTER — Other Ambulatory Visit (HOSPITAL_COMMUNITY): Payer: Medicare Other

## 2021-11-23 DIAGNOSIS — I340 Nonrheumatic mitral (valve) insufficiency: Secondary | ICD-10-CM | POA: Diagnosis not present

## 2021-11-23 DIAGNOSIS — I350 Nonrheumatic aortic (valve) stenosis: Secondary | ICD-10-CM | POA: Diagnosis not present

## 2021-11-23 LAB — ECHOCARDIOGRAM COMPLETE
Area-P 1/2: 2.09 cm2
Calc EF: 69.3 %
S' Lateral: 2.9 cm
Single Plane A2C EF: 67.8 %
Single Plane A4C EF: 71.9 %

## 2021-11-28 DIAGNOSIS — Z0000 Encounter for general adult medical examination without abnormal findings: Secondary | ICD-10-CM | POA: Diagnosis not present

## 2021-11-28 DIAGNOSIS — I5032 Chronic diastolic (congestive) heart failure: Secondary | ICD-10-CM | POA: Diagnosis not present

## 2021-11-28 DIAGNOSIS — I2510 Atherosclerotic heart disease of native coronary artery without angina pectoris: Secondary | ICD-10-CM | POA: Diagnosis not present

## 2021-11-28 DIAGNOSIS — I951 Orthostatic hypotension: Secondary | ICD-10-CM | POA: Diagnosis not present

## 2021-12-04 DIAGNOSIS — R262 Difficulty in walking, not elsewhere classified: Secondary | ICD-10-CM | POA: Diagnosis not present

## 2021-12-04 DIAGNOSIS — M25562 Pain in left knee: Secondary | ICD-10-CM | POA: Diagnosis not present

## 2021-12-07 DIAGNOSIS — R262 Difficulty in walking, not elsewhere classified: Secondary | ICD-10-CM | POA: Diagnosis not present

## 2021-12-07 DIAGNOSIS — M25562 Pain in left knee: Secondary | ICD-10-CM | POA: Diagnosis not present

## 2021-12-11 DIAGNOSIS — R262 Difficulty in walking, not elsewhere classified: Secondary | ICD-10-CM | POA: Diagnosis not present

## 2021-12-11 DIAGNOSIS — M25562 Pain in left knee: Secondary | ICD-10-CM | POA: Diagnosis not present

## 2021-12-17 ENCOUNTER — Telehealth: Payer: Self-pay | Admitting: Cardiology

## 2021-12-17 DIAGNOSIS — I2510 Atherosclerotic heart disease of native coronary artery without angina pectoris: Secondary | ICD-10-CM

## 2021-12-17 DIAGNOSIS — Z951 Presence of aortocoronary bypass graft: Secondary | ICD-10-CM

## 2021-12-17 MED ORDER — MIDODRINE HCL 10 MG PO TABS: ORAL_TABLET | ORAL | 3 refills | Status: DC

## 2021-12-17 NOTE — Telephone Encounter (Signed)
*  STAT* If patient is at the pharmacy, call can be transferred to refill team.   1. Which medications need to be refilled? (please list name of each medication and dose if known)   midodrine (PROAMATINE) 10 MG tablet    2. Which pharmacy/location (including street and city if local pharmacy) is medication to be sent to? [place], [address]  3. Do they need a 30 day or 90 day supply?  90 day  Pt was seen on 11/05/21. Please advise

## 2021-12-18 DIAGNOSIS — R262 Difficulty in walking, not elsewhere classified: Secondary | ICD-10-CM | POA: Diagnosis not present

## 2021-12-18 DIAGNOSIS — M25562 Pain in left knee: Secondary | ICD-10-CM | POA: Diagnosis not present

## 2021-12-21 DIAGNOSIS — M25562 Pain in left knee: Secondary | ICD-10-CM | POA: Diagnosis not present

## 2021-12-21 DIAGNOSIS — R262 Difficulty in walking, not elsewhere classified: Secondary | ICD-10-CM | POA: Diagnosis not present

## 2021-12-25 DIAGNOSIS — R262 Difficulty in walking, not elsewhere classified: Secondary | ICD-10-CM | POA: Diagnosis not present

## 2021-12-25 DIAGNOSIS — M25562 Pain in left knee: Secondary | ICD-10-CM | POA: Diagnosis not present

## 2022-01-04 DIAGNOSIS — M25562 Pain in left knee: Secondary | ICD-10-CM | POA: Diagnosis not present

## 2022-01-04 DIAGNOSIS — R262 Difficulty in walking, not elsewhere classified: Secondary | ICD-10-CM | POA: Diagnosis not present

## 2022-01-11 DIAGNOSIS — R051 Acute cough: Secondary | ICD-10-CM | POA: Diagnosis not present

## 2022-01-11 DIAGNOSIS — J3489 Other specified disorders of nose and nasal sinuses: Secondary | ICD-10-CM | POA: Diagnosis not present

## 2022-01-11 DIAGNOSIS — Z20822 Contact with and (suspected) exposure to covid-19: Secondary | ICD-10-CM | POA: Diagnosis not present

## 2022-01-15 DIAGNOSIS — M25562 Pain in left knee: Secondary | ICD-10-CM | POA: Diagnosis not present

## 2022-01-15 DIAGNOSIS — R262 Difficulty in walking, not elsewhere classified: Secondary | ICD-10-CM | POA: Diagnosis not present

## 2022-01-18 DIAGNOSIS — R262 Difficulty in walking, not elsewhere classified: Secondary | ICD-10-CM | POA: Diagnosis not present

## 2022-01-18 DIAGNOSIS — M25562 Pain in left knee: Secondary | ICD-10-CM | POA: Diagnosis not present

## 2022-01-22 DIAGNOSIS — R262 Difficulty in walking, not elsewhere classified: Secondary | ICD-10-CM | POA: Diagnosis not present

## 2022-01-22 DIAGNOSIS — M25562 Pain in left knee: Secondary | ICD-10-CM | POA: Diagnosis not present

## 2022-01-25 DIAGNOSIS — M25562 Pain in left knee: Secondary | ICD-10-CM | POA: Diagnosis not present

## 2022-01-25 DIAGNOSIS — R262 Difficulty in walking, not elsewhere classified: Secondary | ICD-10-CM | POA: Diagnosis not present

## 2022-02-12 DIAGNOSIS — H532 Diplopia: Secondary | ICD-10-CM | POA: Diagnosis not present

## 2022-02-12 DIAGNOSIS — Z961 Presence of intraocular lens: Secondary | ICD-10-CM | POA: Diagnosis not present

## 2022-02-12 DIAGNOSIS — H401131 Primary open-angle glaucoma, bilateral, mild stage: Secondary | ICD-10-CM | POA: Diagnosis not present

## 2022-02-20 DIAGNOSIS — H5202 Hypermetropia, left eye: Secondary | ICD-10-CM | POA: Diagnosis not present

## 2022-03-03 ENCOUNTER — Other Ambulatory Visit: Payer: Self-pay | Admitting: Cardiology

## 2022-03-03 DIAGNOSIS — Z951 Presence of aortocoronary bypass graft: Secondary | ICD-10-CM

## 2022-03-03 DIAGNOSIS — I2510 Atherosclerotic heart disease of native coronary artery without angina pectoris: Secondary | ICD-10-CM

## 2022-03-07 ENCOUNTER — Other Ambulatory Visit: Payer: Self-pay | Admitting: Cardiology

## 2022-05-07 DIAGNOSIS — L814 Other melanin hyperpigmentation: Secondary | ICD-10-CM | POA: Diagnosis not present

## 2022-05-07 DIAGNOSIS — L821 Other seborrheic keratosis: Secondary | ICD-10-CM | POA: Diagnosis not present

## 2022-05-07 DIAGNOSIS — D225 Melanocytic nevi of trunk: Secondary | ICD-10-CM | POA: Diagnosis not present

## 2022-05-16 DIAGNOSIS — N401 Enlarged prostate with lower urinary tract symptoms: Secondary | ICD-10-CM | POA: Diagnosis not present

## 2022-05-16 DIAGNOSIS — I10 Essential (primary) hypertension: Secondary | ICD-10-CM | POA: Diagnosis not present

## 2022-05-16 DIAGNOSIS — E538 Deficiency of other specified B group vitamins: Secondary | ICD-10-CM | POA: Diagnosis not present

## 2022-05-16 DIAGNOSIS — R531 Weakness: Secondary | ICD-10-CM | POA: Diagnosis not present

## 2022-05-17 LAB — LAB REPORT - SCANNED
EGFR: 89
PSA, Total: 2.73

## 2022-05-22 DIAGNOSIS — I2510 Atherosclerotic heart disease of native coronary artery without angina pectoris: Secondary | ICD-10-CM | POA: Diagnosis not present

## 2022-05-22 DIAGNOSIS — I5032 Chronic diastolic (congestive) heart failure: Secondary | ICD-10-CM | POA: Diagnosis not present

## 2022-05-22 DIAGNOSIS — N401 Enlarged prostate with lower urinary tract symptoms: Secondary | ICD-10-CM | POA: Diagnosis not present

## 2022-05-22 DIAGNOSIS — I951 Orthostatic hypotension: Secondary | ICD-10-CM | POA: Diagnosis not present

## 2022-05-31 DIAGNOSIS — J069 Acute upper respiratory infection, unspecified: Secondary | ICD-10-CM | POA: Diagnosis not present

## 2022-06-01 ENCOUNTER — Other Ambulatory Visit: Payer: Self-pay | Admitting: Cardiology

## 2022-06-12 ENCOUNTER — Other Ambulatory Visit: Payer: Self-pay | Admitting: Cardiology

## 2022-07-09 ENCOUNTER — Other Ambulatory Visit: Payer: Self-pay | Admitting: Cardiology

## 2022-07-09 DIAGNOSIS — Z951 Presence of aortocoronary bypass graft: Secondary | ICD-10-CM

## 2022-07-09 DIAGNOSIS — I2510 Atherosclerotic heart disease of native coronary artery without angina pectoris: Secondary | ICD-10-CM

## 2022-07-15 DIAGNOSIS — H6122 Impacted cerumen, left ear: Secondary | ICD-10-CM | POA: Diagnosis not present

## 2022-07-23 ENCOUNTER — Other Ambulatory Visit: Payer: Self-pay | Admitting: Cardiology

## 2022-07-23 NOTE — Telephone Encounter (Signed)
Patient was prescribed medication for 3 day period only.

## 2022-07-30 DIAGNOSIS — K080 Exfoliation of teeth due to systemic causes: Secondary | ICD-10-CM | POA: Diagnosis not present

## 2022-08-20 DIAGNOSIS — K080 Exfoliation of teeth due to systemic causes: Secondary | ICD-10-CM | POA: Diagnosis not present

## 2022-08-27 DIAGNOSIS — H401131 Primary open-angle glaucoma, bilateral, mild stage: Secondary | ICD-10-CM | POA: Diagnosis not present

## 2022-08-27 DIAGNOSIS — H532 Diplopia: Secondary | ICD-10-CM | POA: Diagnosis not present

## 2022-09-09 ENCOUNTER — Other Ambulatory Visit: Payer: Self-pay | Admitting: Cardiology

## 2022-09-09 DIAGNOSIS — I2510 Atherosclerotic heart disease of native coronary artery without angina pectoris: Secondary | ICD-10-CM

## 2022-09-09 DIAGNOSIS — Z951 Presence of aortocoronary bypass graft: Secondary | ICD-10-CM

## 2022-09-10 ENCOUNTER — Other Ambulatory Visit: Payer: Self-pay | Admitting: Cardiology

## 2022-09-10 DIAGNOSIS — Z951 Presence of aortocoronary bypass graft: Secondary | ICD-10-CM

## 2022-09-10 DIAGNOSIS — I2510 Atherosclerotic heart disease of native coronary artery without angina pectoris: Secondary | ICD-10-CM

## 2022-11-21 DIAGNOSIS — R262 Difficulty in walking, not elsewhere classified: Secondary | ICD-10-CM | POA: Diagnosis not present

## 2022-11-21 DIAGNOSIS — I10 Essential (primary) hypertension: Secondary | ICD-10-CM | POA: Diagnosis not present

## 2022-11-21 DIAGNOSIS — R531 Weakness: Secondary | ICD-10-CM | POA: Diagnosis not present

## 2022-11-21 DIAGNOSIS — E785 Hyperlipidemia, unspecified: Secondary | ICD-10-CM | POA: Diagnosis not present

## 2022-11-21 LAB — LAB REPORT - SCANNED: EGFR: 87

## 2022-11-26 DIAGNOSIS — E785 Hyperlipidemia, unspecified: Secondary | ICD-10-CM | POA: Diagnosis not present

## 2022-11-26 DIAGNOSIS — I951 Orthostatic hypotension: Secondary | ICD-10-CM | POA: Diagnosis not present

## 2022-11-26 DIAGNOSIS — K080 Exfoliation of teeth due to systemic causes: Secondary | ICD-10-CM | POA: Diagnosis not present

## 2022-11-26 DIAGNOSIS — D508 Other iron deficiency anemias: Secondary | ICD-10-CM | POA: Diagnosis not present

## 2022-11-26 DIAGNOSIS — I5032 Chronic diastolic (congestive) heart failure: Secondary | ICD-10-CM | POA: Diagnosis not present

## 2022-11-26 DIAGNOSIS — R609 Edema, unspecified: Secondary | ICD-10-CM | POA: Diagnosis not present

## 2022-11-29 ENCOUNTER — Other Ambulatory Visit: Payer: Self-pay | Admitting: Cardiology

## 2022-11-29 DIAGNOSIS — Z951 Presence of aortocoronary bypass graft: Secondary | ICD-10-CM

## 2022-11-29 DIAGNOSIS — I2510 Atherosclerotic heart disease of native coronary artery without angina pectoris: Secondary | ICD-10-CM

## 2022-12-01 NOTE — Progress Notes (Unsigned)
Cardiology Office Note:   Date:  12/02/2022  ID:  Brian Terry, DOB 04/23/1941, MRN [id number] PCP: Nadara Eaton, MD  [facility] Providers Cardiologist:  Rollene Rotunda, MD {  History of Present Illness:   Brian Terry is a 82 y.o. male who presents follow up of CAD/CABG.   since I last saw him he has had no new cardiovascular complaints.  He lives at [place] and he does walking.   The patient denies any new symptoms such as chest discomfort, neck or arm discomfort. There has been no new shortness of breath, PND or orthopnea. There have been no reported palpitations, presyncope or syncope.  He was concerned because he had some elevated liver enzymes by liver disease and his bilirubin was minimally elevated.  His AST was only a few points above normal.  He had some ankle swelling.  He is not yet really wearing his compression stockings as frequently as I would like.  He takes diuretics on some days.  He is not having a lot of any other symptoms of heart failure.   ROS: As stated in the HPI and negative for all other systems.  Studies Reviewed:    EKG:   EKG Interpretation Date/Time:  Monday December 02 2022 12:08:26 EDT Ventricular Rate:  52 PR Interval:  214 QRS Duration:  108 QT Interval:  426 QTC Calculation: 396 R Axis:   -14  Text Interpretation: Sinus bradycardia with 1st degree A-V block with Premature atrial complexes RSR' or QR pattern in V1 suggests right ventricular conduction delay When compared with ECG of 17-Jul-2018 07:14, Premature atrial complexes are now present Confirmed by Rollene Rotunda ([id number]) on 12/02/2022 12:40:52 PM     Risk Assessment/Calculations:            Physical Exam:   VS:  BP 102/72 (BP Location: Right Arm, Patient Position: Sitting, Cuff Size: Normal)   Pulse (!) 52   Ht 5\' 10"  (1.778 m)   Wt 163 lb (73.9 kg)   SpO2 97%   BMI 23.39 kg/m    Wt Readings from Last 3 Encounters:  12/02/22 163 lb (73.9 kg)  11/05/21 157 lb 12.8 oz  (71.6 kg)  09/05/20 157 lb (71.2 kg)     GEN: Well nourished, well developed in no acute distress NECK: No JVD; No carotid bruits CARDIAC: RRR, 2 out of 6 apical and axillary systolic murmur radiating slightly at aortic outflow tract, no diastolic murmurs, rubs, gallops RESPIRATORY:  Clear to auscultation without rales, wheezing or rhonchi  ABDOMEN: Soft, non-tender, non-distended EXTREMITIES:  No edema; No deformity   ASSESSMENT AND PLAN:   CAD s/p CABG:  The patient has no new sypmtoms.  No further cardiovascular testing is indicated.  We will continue with aggressive risk reduction and meds as listed.   Hyperlipidemia:   His LDL was 71 with an HDL of 61.  He can remain on the meds as listed.  He is liver enzymes are only very minimally elevated but not assessed.  I did review the blood work from his primary provider  Orthostatic hypotension: He does not have any symptoms related to this.  No change in therapy.  MR: There was no evidence of mitral regurgitation on the most recent echo in 2023.  He does have a slight murmur.  No further workup.   Bradycardia:   He has no symptoms related to this.  No change in therapy.       Follow up me in one year.  Signed, Rollene Rotunda, MD

## 2022-12-02 ENCOUNTER — Encounter: Payer: Self-pay | Admitting: Cardiology

## 2022-12-02 ENCOUNTER — Ambulatory Visit: Payer: Medicare Other | Attending: Cardiology | Admitting: Cardiology

## 2022-12-02 VITALS — BP 102/72 | HR 52 | Ht 70.0 in | Wt 163.0 lb

## 2022-12-02 DIAGNOSIS — I2510 Atherosclerotic heart disease of native coronary artery without angina pectoris: Secondary | ICD-10-CM | POA: Diagnosis not present

## 2022-12-02 DIAGNOSIS — I340 Nonrheumatic mitral (valve) insufficiency: Secondary | ICD-10-CM | POA: Diagnosis not present

## 2022-12-02 DIAGNOSIS — E785 Hyperlipidemia, unspecified: Secondary | ICD-10-CM | POA: Diagnosis not present

## 2022-12-02 NOTE — Patient Instructions (Signed)
   Follow-Up: At [facility], you and your health needs are our priority.  As part of our continuing mission to provide you with exceptional heart care, we have created designated Provider Care Teams.  These Care Teams include your primary Cardiologist (physician) and Advanced Practice Providers (APPs -  Physician Assistants and Nurse Practitioners) who all work together to provide you with the care you need, when you need it.  We recommend signing up for the patient portal called "MyChart".  Sign up information is provided on this After Visit Summary.  MyChart is used to connect with patients for Virtual Visits (Telemedicine).  Patients are able to view lab/test results, encounter notes, upcoming appointments, etc.  Non-urgent messages can be sent to your provider as well.   To learn more about what you can do with MyChart, go to [email or web address].    Your next appointment:   12 month(s)  Provider:   James Hochrein, MD      

## 2022-12-04 ENCOUNTER — Other Ambulatory Visit: Payer: Self-pay | Admitting: Cardiology

## 2023-01-08 DIAGNOSIS — D485 Neoplasm of uncertain behavior of skin: Secondary | ICD-10-CM | POA: Diagnosis not present

## 2023-01-08 DIAGNOSIS — L538 Other specified erythematous conditions: Secondary | ICD-10-CM | POA: Diagnosis not present

## 2023-01-08 DIAGNOSIS — L821 Other seborrheic keratosis: Secondary | ICD-10-CM | POA: Diagnosis not present

## 2023-01-08 DIAGNOSIS — L820 Inflamed seborrheic keratosis: Secondary | ICD-10-CM | POA: Diagnosis not present

## 2023-01-08 DIAGNOSIS — L298 Other pruritus: Secondary | ICD-10-CM | POA: Diagnosis not present

## 2023-01-21 DIAGNOSIS — I959 Hypotension, unspecified: Secondary | ICD-10-CM | POA: Diagnosis not present

## 2023-01-21 DIAGNOSIS — N400 Enlarged prostate without lower urinary tract symptoms: Secondary | ICD-10-CM | POA: Diagnosis not present

## 2023-01-21 DIAGNOSIS — H409 Unspecified glaucoma: Secondary | ICD-10-CM | POA: Diagnosis not present

## 2023-01-21 DIAGNOSIS — I2510 Atherosclerotic heart disease of native coronary artery without angina pectoris: Secondary | ICD-10-CM | POA: Diagnosis not present

## 2023-01-21 DIAGNOSIS — D696 Thrombocytopenia, unspecified: Secondary | ICD-10-CM | POA: Diagnosis not present

## 2023-01-21 DIAGNOSIS — Z6823 Body mass index (BMI) 23.0-23.9, adult: Secondary | ICD-10-CM | POA: Diagnosis not present

## 2023-01-21 DIAGNOSIS — I5030 Unspecified diastolic (congestive) heart failure: Secondary | ICD-10-CM | POA: Diagnosis not present

## 2023-01-21 DIAGNOSIS — E785 Hyperlipidemia, unspecified: Secondary | ICD-10-CM | POA: Diagnosis not present

## 2023-01-31 DIAGNOSIS — H6122 Impacted cerumen, left ear: Secondary | ICD-10-CM | POA: Diagnosis not present

## 2023-02-27 ENCOUNTER — Other Ambulatory Visit: Payer: Self-pay | Admitting: Cardiology

## 2023-02-27 DIAGNOSIS — Z951 Presence of aortocoronary bypass graft: Secondary | ICD-10-CM

## 2023-02-27 DIAGNOSIS — I2510 Atherosclerotic heart disease of native coronary artery without angina pectoris: Secondary | ICD-10-CM

## 2023-03-04 DIAGNOSIS — D3132 Benign neoplasm of left choroid: Secondary | ICD-10-CM | POA: Diagnosis not present

## 2023-03-04 DIAGNOSIS — Z961 Presence of intraocular lens: Secondary | ICD-10-CM | POA: Diagnosis not present

## 2023-03-04 DIAGNOSIS — H401131 Primary open-angle glaucoma, bilateral, mild stage: Secondary | ICD-10-CM | POA: Diagnosis not present

## 2023-03-25 DIAGNOSIS — K080 Exfoliation of teeth due to systemic causes: Secondary | ICD-10-CM | POA: Diagnosis not present

## 2023-04-01 DIAGNOSIS — K080 Exfoliation of teeth due to systemic causes: Secondary | ICD-10-CM | POA: Diagnosis not present

## 2023-04-22 DIAGNOSIS — K080 Exfoliation of teeth due to systemic causes: Secondary | ICD-10-CM | POA: Diagnosis not present

## 2023-04-25 ENCOUNTER — Ambulatory Visit: Payer: Medicare Other | Admitting: Gastroenterology

## 2023-04-25 ENCOUNTER — Encounter: Payer: Self-pay | Admitting: Gastroenterology

## 2023-04-25 VITALS — BP 118/70 | HR 77 | Ht 70.0 in | Wt 165.0 lb

## 2023-04-25 DIAGNOSIS — K219 Gastro-esophageal reflux disease without esophagitis: Secondary | ICD-10-CM | POA: Diagnosis not present

## 2023-04-25 DIAGNOSIS — Z860100 Personal history of colon polyps, unspecified: Secondary | ICD-10-CM

## 2023-04-25 NOTE — Progress Notes (Signed)
[facility] Consult Note:  History: Brian Terry 04/25/2023  Referring provider: Orpha Bur, MD  Reason for consult/chief complaint: Colonoscopy (Pt states he is doing well today, pt here to discuss having an colon)   Subjective  Prior history: No colon polyps 2007 Diminutive rectal tubular adenoma November 2027.  5-year recall recommended.  He was sent to subsequent letter in 2022 after updated screening/surveillance guidelines, and follow-up exam was no longer recommended.   Discussed the use of AI scribe software for clinical note transcription with the patient, who gave verbal consent to proceed.  History of Present Illness   The patient, with a history of cardiac bypass surgery and a small adenoma polyp found during a colonoscopy in 2017, presents for a consultation regarding the necessity of a follow-up colonoscopy. They report being in good health and maintaining an active lifestyle. They deny any lower digestive symptoms, including changes in bowel habits or rectal bleeding.  However, they report occasional difficulty swallowing, attributing it to acid reflux and inadequate mastication. This has led to instances where food gets 'stuck' in their throat, sometimes necessitating regurgitation to clear the obstruction. These episodes are infrequent and he does not express concern about them.  The patient also reports occasional acid reflux, particularly when certain foods are consumed. They do not take regular medication for acid reduction. Despite these digestive issues, the patient maintains a robust appetite and stable weight.  The patient's cardiac health appears stable following a three-vessel bypass in 2020, with no reports of exertional chest pain or shortness of breath. They remain active and engaged in their continuing care community.       ROS:  Review of Systems  Constitutional:  Negative for appetite change and unexpected weight change.   HENT:  Negative for mouth sores and voice change.   Eyes:  Negative for pain and redness.  Respiratory:  Negative for cough and shortness of breath.   Cardiovascular:  Negative for chest pain and palpitations.  Genitourinary:  Negative for dysuria and hematuria.  Musculoskeletal:  Negative for arthralgias and myalgias.  Skin:  Negative for pallor and rash.  Neurological:  Negative for weakness and headaches.  Hematological:  Negative for adenopathy.     Past Medical History: Past Medical History:  Diagnosis Date   GERD (gastroesophageal reflux disease)    Glaucoma    Hemorrhoids    Hyperlipidemia   Patient underwent a three-vessel CABG in 2020  From last cardiology office visit on 12/02/2022 "Brian Terry is a 82 y.o. male who presents follow up of CAD/CABG.   since I last saw him he has had no new cardiovascular complaints.  He lives at [place] and he does walking.   The patient denies any new symptoms such as chest discomfort, neck or arm discomfort. There has been no new shortness of breath, PND or orthopnea. There have been no reported palpitations, presyncope or syncope.  He was concerned because he had some elevated liver enzymes by liver disease and his bilirubin was minimally elevated.  His AST was only a few points above normal.  He had some ankle swelling.  He is not yet really wearing his compression stockings as frequently as I would like.  He takes diuretics on some days.  He is not having a lot of any other symptoms of heart failure. " Patient had no new symptoms and no further cardiovascular testing was indicated.   Past Surgical History: Past Surgical History:  Procedure Laterality Date   BALLOON  DILATION N/A 03/01/2015   Procedure: BALLOON DILATION;  Surgeon: Brian Meckel, MD;  Location: [facility];  Service: Endoscopy;  Laterality: N/A;   CATARACT EXTRACTION     CORONARY ARTERY BYPASS GRAFT N/A 07/16/2018   Procedure: CORONARY ARTERY BYPASS GRAFTING (CABG), ON  PUMP, TIMES THREE, USING LEFT INTERNAL MAMMARY ARTERY AND LEFT GREATER SAHENOUS VEIN HARVESTED ENDOSCOPICALLY;  Surgeon: Brian Borne, MD;  Location: [facility] OR;  Service: Open Heart Surgery;  Laterality: N/A;   ESOPHAGOGASTRODUODENOSCOPY N/A 03/01/2015   Procedure: ESOPHAGOGASTRODUODENOSCOPY (EGD);  Surgeon: Brian Meckel, MD;  Location: [facility];  Service: Endoscopy;  Laterality: N/A;   HERNIA REPAIR Left 2010   inguinal    LEFT HEART CATH AND CORONARY ANGIOGRAPHY N/A 07/06/2018   Procedure: LEFT HEART CATH AND CORONARY ANGIOGRAPHY;  Surgeon: Brian Kendall, MD;  [facility];  Service: Cardiovascular;  Laterality: N/A;   RETINAL DETACHMENT SURGERY Right 2000   TEE WITHOUT CARDIOVERSION N/A 07/16/2018   Procedure: TRANSESOPHAGEAL ECHOCARDIOGRAM (TEE);  Surgeon: Brian Borne, MD;  Location: [facility] OR;  Service: Open Heart Surgery;  Laterality: N/A;   vein removal Right    R leg     Family History: Family History  Problem Relation Age of Onset   Heart disease Mother    Colon polyps Father    Stomach cancer Father    Heart disease Brother 10       Stent    Social History: Social History   Socioeconomic History   Marital status: Married    Spouse name: Not on file   Number of children: 0   Years of education: Not on file   Highest education level: Not on file  Occupational History   Occupation: Accoumting   Tobacco Use   Smoking status: Never   Smokeless tobacco: Never  Vaping Use   Vaping status: Never Used  Substance and Sexual Activity   Alcohol use: No    Alcohol/week: 0.0 standard drinks of alcohol   Drug use: No   Sexual activity: Not on file  Other Topics Concern   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  No children.    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     aspirin EC 81 MG tablet Take 81 mg by mouth daily.     Cyanocobalamin (VITAMIN B12) 1000 MCG TBCR Take by mouth daily.     ezetimibe (ZETIA) 10 MG tablet TAKE ONE TABLET BY MOUTH DAILY. 90 tablet 3   Ferrous Sulfate (IRON) 325 (65 Fe) MG TABS Take 1 tablet by mouth daily.     furosemide (LASIX) 20 MG tablet TAKE 1 TABLET ONCE DAILY FOR 3 DAYS. (Patient taking differently: Pt taking 1 once a week) 30 tablet 11   midodrine (PROAMATINE) 10 MG tablet TAKE ONE TABLET BY MOUTH THREE TIMES DAILY AFTER MEALS. Please keep upcoming  appointment in July 2024 for future refills. Thank you 270 tablet 1   Multiple Vitamin (MULTIVITAMIN WITH MINERALS) TABS tablet Take 1 tablet by mouth daily.      rosuvastatin (CRESTOR) 40 MG tablet TAKE ONE TABLET BY MOUTH ONCE DAILY 90 tablet 3   tamsulosin (FLOMAX) 0.4 MG CAPS capsule TAKE ONE CAPSULE BY MOUTH DAILY 90 capsule 3   timolol (TIMOPTIC) 0.5 % ophthalmic solution Place 1 drop into both eyes 2 (two) times daily.   98   Acetaminophen (TYLENOL ARTHRITIS PAIN PO) Take 650 mg by mouth daily. (Patient not taking: Reported on 04/25/2023)     nitroGLYCERIN (NITROSTAT) 0.4 MG SL tablet Place 1 tablet (0.4 mg total) under the tongue every 5 (five) minutes as needed for chest pain. 30 tablet prn   No current facility-administered medications for this visit.      ___________________________________________________________________ Objective   Exam:  BP 118/70   Pulse 77   Ht 5\' 10"  (1.778 m)   Wt 165 lb (74.8 kg)   BMI 23.68 kg/m  Wt Readings from Last 3 Encounters:   04/25/23 165 lb (74.8 kg)  12/02/22 163 lb (73.9 kg)  11/05/21 157 lb 12.8 oz (71.6 kg)    General: Well-appearing Eyes: sclera anicteric, no redness ENT: oral mucosa moist without lesions, no cervical or supraclavicular lymphadenopathy CV: Soft systolic murmur, no JVD, no peripheral edema Resp: clear to auscultation bilaterally, normal RR and effort noted GI: soft, no tenderness, with active bowel sounds. No guarding or palpable organomegaly noted. Skin; warm and dry, no rash or jaundice noted Neuro: awake, alert and oriented x 3. Normal gross motor function and fluent speech   Data:  June 2023 echocardiogram   1. There is an LV moderator band. Left ventricular ejection fraction, by  estimation, is 60 to 65%. The left ventricle has normal function. The left  ventricle has no regional wall motion abnormalities. Left ventricular  diastolic parameters are consistent   with Grade I diastolic dysfunction (impaired relaxation).   2. Right ventricular systolic function is normal. The right ventricular  size is normal.   3. Left atrial size was moderately dilated.   4. Right atrial size was mild to moderately dilated.   5. The mitral valve is normal in structure. No evidence of mitral valve  regurgitation. No evidence of mitral stenosis.   6. The aortic valve is tricuspid. There is moderate calcification of the  aortic valve. Aortic valve regurgitation is trivial. No aortic stenosis is  present.   7. Aortic dilatation noted. There is mild dilatation of the ascending  aorta, measuring 41 mm.   8. The inferior vena cava is dilated in size with <50% respiratory  variability, suggesting right atrial pressure of 15 mmHg.   Encounter Diagnoses  Name Primary?   Hx of colonic polyps Yes   Gastroesophageal reflux disease, unspecified whether esophagitis present     Assessment and Plan    Colon Cancer Surveillance History of small adenomatous polyp in 2017. Discussed the  risks/benefits of colonoscopy in the context of age and cardiac history. No current lower GI symptoms. His risks (sedation related, perforation, perhaps bleeding) are above average due to age and his cardiac condition.  That said, his overall health appears very good, he has no current cardiac symptoms, and I think those risks would be acceptable if he wishes to proceed.  Difficult to give him an analysis on the benefits since likelihood of finding a polyp is unknown.  He appreciated the  discussion and would like to give it some further consideration.  His wife is having a medical procedure next month, so Adiel thinks that if he was inclined to pursue a colonoscopy, it probably would not be for couple of months from now.  Gastroesophageal Reflux Disease (GERD) Infrequent episodes of food impaction and acid reflux. No regular acid reduction medication. -Continue current management.    Thank you for the courtesy of this consult.  Please call me with any questions or concerns.  Charlie Pitter III  CC: Referring provider noted above

## 2023-04-25 NOTE — Patient Instructions (Signed)
Plese give [place] a call when you're ready to schedule for colonoscopy   _______________________________________________________  If your blood pressure at your visit was 140/90 or greater, please contact your primary care physician to follow up on this.  _______________________________________________________  If you are age 81 or older, your body mass index should be between 23-30. Your Body mass index is 23.68 kg/m. If this is out of the aforementioned range listed, please consider follow up with your Primary Care Provider.  If you are age 11 or younger, your body mass index should be between 19-25. Your Body mass index is 23.68 kg/m. If this is out of the aformentioned range listed, please consider follow up with your Primary Care Provider.   ________________________________________________________  The [facility] providers would like to encourage you to use [facility] to communicate with providers for non-urgent requests or questions.  Due to long hold times on the telephone, sending your provider a message by [facility] may be a faster and more efficient way to get a response.  Please allow 48 business hours for a response.  Please remember that this is for non-urgent requests.  _______________________________________________________ It was a pleasure to see you today!  Thank you for trusting me with your gastrointestinal care!

## 2023-05-21 DIAGNOSIS — R0981 Nasal congestion: Secondary | ICD-10-CM | POA: Diagnosis not present

## 2023-05-21 DIAGNOSIS — R509 Fever, unspecified: Secondary | ICD-10-CM | POA: Diagnosis not present

## 2023-05-21 DIAGNOSIS — J029 Acute pharyngitis, unspecified: Secondary | ICD-10-CM | POA: Diagnosis not present

## 2023-05-28 ENCOUNTER — Other Ambulatory Visit: Payer: Self-pay | Admitting: Cardiology

## 2023-06-17 DIAGNOSIS — D225 Melanocytic nevi of trunk: Secondary | ICD-10-CM | POA: Diagnosis not present

## 2023-06-17 DIAGNOSIS — C4441 Basal cell carcinoma of skin of scalp and neck: Secondary | ICD-10-CM | POA: Diagnosis not present

## 2023-06-17 DIAGNOSIS — L570 Actinic keratosis: Secondary | ICD-10-CM | POA: Diagnosis not present

## 2023-06-17 DIAGNOSIS — L820 Inflamed seborrheic keratosis: Secondary | ICD-10-CM | POA: Diagnosis not present

## 2023-06-17 DIAGNOSIS — Z1283 Encounter for screening for malignant neoplasm of skin: Secondary | ICD-10-CM | POA: Diagnosis not present

## 2023-06-17 DIAGNOSIS — X32XXXA Exposure to sunlight, initial encounter: Secondary | ICD-10-CM | POA: Diagnosis not present

## 2023-06-20 DIAGNOSIS — E785 Hyperlipidemia, unspecified: Secondary | ICD-10-CM | POA: Diagnosis not present

## 2023-06-20 DIAGNOSIS — I2510 Atherosclerotic heart disease of native coronary artery without angina pectoris: Secondary | ICD-10-CM | POA: Diagnosis not present

## 2023-06-20 DIAGNOSIS — D696 Thrombocytopenia, unspecified: Secondary | ICD-10-CM | POA: Diagnosis not present

## 2023-06-20 LAB — LAB REPORT - SCANNED: EGFR: 87

## 2023-06-27 DIAGNOSIS — N400 Enlarged prostate without lower urinary tract symptoms: Secondary | ICD-10-CM | POA: Diagnosis not present

## 2023-06-27 DIAGNOSIS — E785 Hyperlipidemia, unspecified: Secondary | ICD-10-CM | POA: Diagnosis not present

## 2023-06-27 DIAGNOSIS — I959 Hypotension, unspecified: Secondary | ICD-10-CM | POA: Diagnosis not present

## 2023-07-08 DIAGNOSIS — K080 Exfoliation of teeth due to systemic causes: Secondary | ICD-10-CM | POA: Diagnosis not present

## 2023-07-29 DIAGNOSIS — Z08 Encounter for follow-up examination after completed treatment for malignant neoplasm: Secondary | ICD-10-CM | POA: Diagnosis not present

## 2023-07-29 DIAGNOSIS — Z85828 Personal history of other malignant neoplasm of skin: Secondary | ICD-10-CM | POA: Diagnosis not present

## 2023-07-29 DIAGNOSIS — B355 Tinea imbricata: Secondary | ICD-10-CM | POA: Diagnosis not present

## 2023-08-26 ENCOUNTER — Other Ambulatory Visit: Payer: Self-pay | Admitting: Cardiology

## 2023-08-26 DIAGNOSIS — Z951 Presence of aortocoronary bypass graft: Secondary | ICD-10-CM

## 2023-08-26 DIAGNOSIS — I2510 Atherosclerotic heart disease of native coronary artery without angina pectoris: Secondary | ICD-10-CM

## 2023-08-31 ENCOUNTER — Ambulatory Visit
Admission: RE | Admit: 2023-08-31 | Discharge: 2023-08-31 | Disposition: A | Discharge status: Home / Self Care | Source: Ambulatory Visit | Attending: Internal Medicine

## 2023-08-31 DIAGNOSIS — H6122 Impacted cerumen, left ear: Secondary | ICD-10-CM | POA: Diagnosis not present

## 2023-08-31 NOTE — ED Provider Notes (Signed)
 [facility]    CSN: [id number] Arrival date & time: 08/31/23  1414      History   Chief Complaint Chief Complaint  Patient presents with   Cerumen Impaction    HPI Brian Terry is a 83 y.o. male.   Brian Terry is a 83 y.o. male presenting for chief complaint of Cerumen Impaction to the left ear. He was fitted for hearing aids last week and the audiologist noted some earwax to the left ear canal that was nonobstructing.  Patient started wearing a hearing aid to the left ear and reports pain.  He assumed pain was related to the pressure that the hearing aid places on the earwax in the eardrum.  He has not noticed any drainage from the left ear.  Denies tinnitus, fever, chills, viral URI symptoms, headache, and dizziness.  He sometimes uses Debrox eardrops at home to relieve earwax and has not attempted use of these prior to arrival today.     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   CAD (coronary artery disease) 07/14/2018   CAD in native artery 07/06/2018   Abnormal stress test 06/29/2018   Medication management 06/29/2018   Hyperlipidemia 06/29/2018   Esophageal stricture 03/01/2015   Duodenitis 03/01/2015   Dysphagia, pharyngoesophageal phase 02/24/2015   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   ANHIDROSIS 10/22/2006    Past Surgical History:  Procedure Laterality Date   BALLOON DILATION N/A 03/01/2015   Procedure: BALLOON DILATION;  Surgeon: Louis Meckel, MD;  Location: [facility];  Service: Endoscopy;  Laterality: N/A;   CATARACT EXTRACTION     CORONARY ARTERY BYPASS GRAFT N/A 07/16/2018   Procedure: CORONARY ARTERY BYPASS GRAFTING (CABG), ON PUMP,  TIMES THREE, USING LEFT INTERNAL MAMMARY ARTERY AND LEFT GREATER SAHENOUS VEIN HARVESTED ENDOSCOPICALLY;  Surgeon: Alleen Borne, MD;  Location: [facility] OR;  Service: Open Heart Surgery;  Laterality: N/A;   ESOPHAGOGASTRODUODENOSCOPY N/A 03/01/2015   Procedure: ESOPHAGOGASTRODUODENOSCOPY (EGD);  Surgeon: Louis Meckel, MD;  Location: [facility];  Service: Endoscopy;  Laterality: N/A;   HERNIA REPAIR Left 2010   inguinal    LEFT HEART CATH AND CORONARY ANGIOGRAPHY N/A 07/06/2018   Procedure: LEFT HEART CATH AND CORONARY ANGIOGRAPHY;  Surgeon: Yvonne Kendall, MD;  Location: [facility];  Service: Cardiovascular;  Laterality: N/A;   RETINAL DETACHMENT SURGERY Right 2000   TEE WITHOUT CARDIOVERSION N/A 07/16/2018   Procedure: TRANSESOPHAGEAL ECHOCARDIOGRAM (TEE);  Surgeon: Alleen Borne, MD;  Location: [facility] OR;  Service: Open Heart Surgery;  Laterality: N/A;   vein removal Right    R leg       Home Medications    Prior to Admission medications   Medication Sig Start Date End Date Taking? Authorizing Provider  Acetaminophen (TYLENOL ARTHRITIS PAIN PO) Take 650 mg by mouth daily. Patient not taking: Reported on 04/25/2023    [provider]  Ascorbic Acid (VITAMIN C WITH ROSE HIPS) 1000 MG tablet Take 1,000 mg by mouth daily.    [provider]  aspirin EC 81 MG tablet Take 81 mg by mouth daily.    [provider]  Cyanocobalamin (VITAMIN B12) 1000 MCG TBCR  Take by mouth daily. 05/14/22   [provider]  ezetimibe (ZETIA) 10 MG tablet TAKE ONE TABLET BY MOUTH DAILY. 05/29/23   Rollene Rotunda, MD  Ferrous Sulfate (IRON) 325 (65 Fe) MG TABS Take 1 tablet by mouth daily.    [provider]  furosemide (LASIX) 20 MG tablet TAKE 1 TABLET ONCE DAILY FOR 3 DAYS. Patient taking differently: Pt taking 1 once a week 07/23/22   Rollene Rotunda, MD  midodrine (PROAMATINE) 10 MG tablet TAKE ONE TABLET BY MOUTH THREE TIMES DAILY AFTER MEALS. 08/27/23    Rollene Rotunda, MD  Multiple Vitamin (MULTIVITAMIN WITH MINERALS) TABS tablet Take 1 tablet by mouth daily.     [provider]  nitroGLYCERIN (NITROSTAT) 0.4 MG SL tablet Place 1 tablet (0.4 mg total) under the tongue every 5 (five) minutes as needed for chest pain. 07/06/18 09/06/19  End, Cristal Deer, MD  rosuvastatin (CRESTOR) 40 MG tablet TAKE ONE TABLET BY MOUTH ONCE DAILY 02/27/23   Rollene Rotunda, MD  tamsulosin (FLOMAX) 0.4 MG CAPS capsule TAKE ONE CAPSULE BY MOUTH DAILY 12/04/22   Rollene Rotunda, MD  timolol (TIMOPTIC) 0.5 % ophthalmic solution Place 1 drop into both eyes 2 (two) times daily.  01/27/15   [provider]    Family History Family History  Problem Relation Age of Onset   Heart disease Mother    Colon polyps Father    Stomach cancer Father    Heart disease Brother 41       Stent    Social History Social History   Tobacco Use   Smoking status: Never   Smokeless tobacco: Never  Vaping Use   Vaping status: Never Used  Substance Use Topics   Alcohol use: No    Alcohol/week: 0.0 standard drinks of alcohol   Drug use: No     Allergies   Sulfa antibiotics   Review of Systems Review of Systems Per HPI  Physical Exam Triage Vital Signs ED Triage Vitals  Encounter Vitals Group     BP      Systolic BP Percentile      Diastolic BP Percentile      Pulse      Resp      Temp      Temp src      SpO2      Weight      Height      Head Circumference      Peak Flow      Pain Score      Pain Loc      Pain Education      Exclude from Growth Chart    No data found.  Updated Vital Signs There were no vitals taken for this visit.  Visual Acuity Right Eye Distance:   Left Eye Distance:   Bilateral Distance:    Right Eye Near:   Left Eye Near:    Bilateral Near:     Physical Exam Vitals and nursing note reviewed.  Constitutional:      Appearance: He is not ill-appearing or toxic-appearing.  HENT:     Head: Normocephalic and  atraumatic.     Right Ear: Hearing, tympanic membrane, ear canal and external ear normal.     Left Ear: Hearing, tympanic membrane, ear canal and external ear normal. There is impacted cerumen.     Ears:     Comments: Impacted cerumen on initial assessment mostly obstructing view of left TM.  On reassessment after ear lavage, no signs  of cerumen impaction to the left ear and left TM is normal/without infection or perforation.    Nose: Nose normal.     Mouth/Throat:     Lips: Pink.     Mouth: Mucous membranes are moist. No injury or oral lesions.     Dentition: Normal dentition.     Tongue: No lesions.     Pharynx: Oropharynx is clear. Uvula midline. No pharyngeal swelling, oropharyngeal exudate, posterior oropharyngeal erythema, uvula swelling or postnasal drip.     Tonsils: No tonsillar exudate.  Eyes:     General: Lids are normal. Vision grossly intact. Gaze aligned appropriately.     Extraocular Movements: Extraocular movements intact.     Conjunctiva/sclera: Conjunctivae normal.  Neck:     Trachea: Trachea and phonation normal.  Pulmonary:     Effort: Pulmonary effort is normal.  Musculoskeletal:     Cervical back: Neck supple.  Lymphadenopathy:     Cervical: No cervical adenopathy.  Skin:    General: Skin is warm and dry.     Capillary Refill: Capillary refill takes less than 2 seconds.     Findings: No rash.  Neurological:     General: No focal deficit present.     Mental Status: He is alert and oriented to person, place, and time. Mental status is at baseline.     Cranial Nerves: No dysarthria or facial asymmetry.  Psychiatric:        Mood and Affect: Mood normal.        Speech: Speech normal.        Behavior: Behavior normal.        Thought Content: Thought content normal.        Judgment: Judgment normal.      UC Treatments / Results  Labs (all labs ordered are listed, but only abnormal results are displayed) Labs Reviewed - No data to  display  EKG   Radiology No results found.  Procedures Procedures (including critical care time)  Medications Ordered in UC Medications - No data to display  Initial Impression / Assessment and Plan / UC Course  I have reviewed the triage vital signs and the nursing notes.  Pertinent labs & imaging results that were available during my care of the patient were reviewed by me and considered in my medical decision making (see chart for details).   1.  Impacted cerumen of left ear Left ear(s) cleaned with ear lavage to remove ear wax impactions bilaterally by nursing staff.  Reassessment shows normal left TM/ear canal without any signs of infection.  Patient may use debrox ear drops at home as needed for wax removal and has been advised to avoid using Q-tips.   Counseled patient on potential for adverse effects with medications prescribed/recommended today, strict ER and return-to-clinic precautions discussed, patient verbalized understanding.   Patient left urgent care prior to receiving after visit summary. Final Clinical Impressions(s) / UC Diagnoses   Final diagnoses:  Impacted cerumen of left ear   Discharge Instructions   None    ED Prescriptions   None    PDMP not reviewed this encounter.   Carlisle Beers, [place] 08/31/23 1521

## 2023-08-31 NOTE — ED Triage Notes (Signed)
 Pt presents for ear wax removal from left ear.

## 2023-09-02 DIAGNOSIS — H5000 Unspecified esotropia: Secondary | ICD-10-CM | POA: Diagnosis not present

## 2023-09-02 DIAGNOSIS — Z961 Presence of intraocular lens: Secondary | ICD-10-CM | POA: Diagnosis not present

## 2023-09-02 DIAGNOSIS — H532 Diplopia: Secondary | ICD-10-CM | POA: Diagnosis not present

## 2023-09-16 DIAGNOSIS — M722 Plantar fascial fibromatosis: Secondary | ICD-10-CM | POA: Diagnosis not present

## 2023-09-20 ENCOUNTER — Other Ambulatory Visit: Payer: Self-pay | Admitting: Cardiology

## 2023-11-23 ENCOUNTER — Other Ambulatory Visit: Payer: Self-pay | Admitting: Cardiology

## 2023-11-26 DIAGNOSIS — K080 Exfoliation of teeth due to systemic causes: Secondary | ICD-10-CM | POA: Diagnosis not present

## 2023-12-26 ENCOUNTER — Other Ambulatory Visit: Payer: Self-pay | Admitting: Cardiology

## 2024-01-02 DIAGNOSIS — R109 Unspecified abdominal pain: Secondary | ICD-10-CM | POA: Diagnosis not present

## 2024-01-09 DIAGNOSIS — Z0000 Encounter for general adult medical examination without abnormal findings: Secondary | ICD-10-CM | POA: Diagnosis not present

## 2024-01-09 DIAGNOSIS — E785 Hyperlipidemia, unspecified: Secondary | ICD-10-CM | POA: Diagnosis not present

## 2024-01-09 DIAGNOSIS — K219 Gastro-esophageal reflux disease without esophagitis: Secondary | ICD-10-CM | POA: Diagnosis not present

## 2024-01-09 DIAGNOSIS — Z951 Presence of aortocoronary bypass graft: Secondary | ICD-10-CM | POA: Diagnosis not present

## 2024-01-09 DIAGNOSIS — D696 Thrombocytopenia, unspecified: Secondary | ICD-10-CM | POA: Diagnosis not present

## 2024-01-27 DIAGNOSIS — Z85828 Personal history of other malignant neoplasm of skin: Secondary | ICD-10-CM | POA: Diagnosis not present

## 2024-01-27 DIAGNOSIS — Z08 Encounter for follow-up examination after completed treatment for malignant neoplasm: Secondary | ICD-10-CM | POA: Diagnosis not present

## 2024-02-06 ENCOUNTER — Other Ambulatory Visit: Payer: Self-pay | Admitting: Cardiology

## 2024-02-06 NOTE — Telephone Encounter (Signed)
 Pt is requesting a refill on non cardiac medication tamsulosin 0.4 mg capsule. Would Dr. Lavona like to refill this non cardiac medication? Please address

## 2024-02-06 NOTE — Telephone Encounter (Signed)
*  STAT* If patient is at the pharmacy, call can be transferred to refill team.   1. Which medications need to be refilled? (please list name of each medication and dose if known)   tamsulosin (FLOMAX) 0.4 MG CAPS capsule     2. Would you like to learn more about the convenience, safety, & potential cost savings by using the [facility]? No   3. Are you open to using the [facility] (Type [facility].) No   4. Which pharmacy/location (including street and city if local pharmacy) is medication to be sent to? [facility] [place], [place] - [address]    5. Do they need a 30 day or 90 day supply? 30 day  Pt has scheduled appt on 10/1

## 2024-02-08 ENCOUNTER — Other Ambulatory Visit: Payer: Self-pay | Admitting: Cardiology

## 2024-02-11 ENCOUNTER — Other Ambulatory Visit: Payer: Self-pay | Admitting: Cardiology

## 2024-02-21 ENCOUNTER — Other Ambulatory Visit: Payer: Self-pay | Admitting: Cardiology

## 2024-02-21 DIAGNOSIS — Z951 Presence of aortocoronary bypass graft: Secondary | ICD-10-CM

## 2024-02-21 DIAGNOSIS — I2510 Atherosclerotic heart disease of native coronary artery without angina pectoris: Secondary | ICD-10-CM

## 2024-02-23 DIAGNOSIS — R051 Acute cough: Secondary | ICD-10-CM | POA: Diagnosis not present

## 2024-02-23 DIAGNOSIS — R0982 Postnasal drip: Secondary | ICD-10-CM | POA: Diagnosis not present

## 2024-02-23 DIAGNOSIS — R0602 Shortness of breath: Secondary | ICD-10-CM | POA: Diagnosis not present

## 2024-02-29 ENCOUNTER — Other Ambulatory Visit: Payer: Self-pay | Admitting: Cardiology

## 2024-02-29 DIAGNOSIS — R001 Bradycardia, unspecified: Secondary | ICD-10-CM | POA: Insufficient documentation

## 2024-02-29 DIAGNOSIS — I340 Nonrheumatic mitral (valve) insufficiency: Secondary | ICD-10-CM | POA: Insufficient documentation

## 2024-02-29 NOTE — Progress Notes (Unsigned)
  Cardiology Office Note:   Date:  02/29/2024  ID:  Brian Terry, DOB 03-27-1941, MRN [id number] PCP: Delayne Artist PARAS, MD  [facility] Providers Cardiologist:  Lynwood Schilling, MD {  History of Present Illness:   Brian Terry is a 83 y.o. male who presents follow up of CAD/CABG.   Since I last saw him ***   *** since I last saw him he has had no new cardiovascular complaints.  He lives at [place] and he does walking.   The patient denies any new symptoms such as chest discomfort, neck or arm discomfort. There has been no new shortness of breath, PND or orthopnea. There have been no reported palpitations, presyncope or syncope.  He was concerned because he had some elevated liver enzymes by liver disease and his bilirubin was minimally elevated.  His AST was only a few points above normal.  He had some ankle swelling.  He is not yet really wearing his compression stockings as frequently as I would like.  He takes diuretics on some days.  He is not having a lot of any other symptoms of heart failure.   ROS: ***  Studies Reviewed:    EKG:       ***  Risk Assessment/Calculations:   {Does this patient have ATRIAL FIBRILLATION?:[id number]} No BP recorded.  {Refresh Note OR Click here to enter BP  :1}***        Physical Exam:   VS:  There were no vitals taken for this visit.   Wt Readings from Last 3 Encounters:  04/25/23 165 lb (74.8 kg)  12/02/22 163 lb (73.9 kg)  11/05/21 157 lb 12.8 oz (71.6 kg)     GEN: Well nourished, well developed in no acute distress NECK: No JVD; No carotid bruits CARDIAC: ***RR, *** murmurs, rubs, gallops RESPIRATORY:  Clear to auscultation without rales, wheezing or rhonchi  ABDOMEN: Soft, non-tender, non-distended EXTREMITIES:  No edema; No deformity   ASSESSMENT AND PLAN:   CAD s/p CABG:  ***  The patient has no new sypmtoms.  No further cardiovascular testing is indicated.  We will continue with aggressive risk reduction and meds as  listed.   Hyperlipidemia:   His LDL was ***  71 with an HDL of 61.  He can remain on the meds as listed.  He is liver enzymes are only very minimally elevated but not assessed.  I did review the blood work from his primary provider   Orthostatic hypotension: ***  He does not have any symptoms related to this.  No change in therapy.   MR:   ***  There was no evidence of mitral regurgitation on the most recent echo in 2023.  He does have a slight murmur.  No further workup.   Bradycardia:   ***  He has no symptoms related to this.  No change in therapy.     Follow up ***  Signed, Lynwood Schilling, MD

## 2024-03-02 DIAGNOSIS — H52203 Unspecified astigmatism, bilateral: Secondary | ICD-10-CM | POA: Diagnosis not present

## 2024-03-03 ENCOUNTER — Ambulatory Visit: Attending: Internal Medicine | Admitting: Cardiology

## 2024-03-03 ENCOUNTER — Telehealth: Payer: Self-pay | Admitting: Cardiology

## 2024-03-03 ENCOUNTER — Ambulatory Visit

## 2024-03-03 ENCOUNTER — Encounter: Payer: Self-pay | Admitting: Cardiology

## 2024-03-03 VITALS — BP 90/60 | HR 73 | Ht 70.0 in | Wt 168.0 lb

## 2024-03-03 DIAGNOSIS — R001 Bradycardia, unspecified: Secondary | ICD-10-CM

## 2024-03-03 DIAGNOSIS — I340 Nonrheumatic mitral (valve) insufficiency: Secondary | ICD-10-CM | POA: Diagnosis not present

## 2024-03-03 DIAGNOSIS — I2510 Atherosclerotic heart disease of native coronary artery without angina pectoris: Secondary | ICD-10-CM

## 2024-03-03 DIAGNOSIS — E785 Hyperlipidemia, unspecified: Secondary | ICD-10-CM | POA: Diagnosis not present

## 2024-03-03 NOTE — Telephone Encounter (Signed)
 Caller Francie) called to report to RN Aleck that patient's last BMET was done in 2018 and she will be faxing those results.

## 2024-03-03 NOTE — Progress Notes (Unsigned)
Applied a 3 day Zio XT monitor to patient in the office 

## 2024-03-03 NOTE — Patient Instructions (Signed)
 Medication Instructions:  Your physician recommends that you continue on your current medications as directed. Please refer to the Current Medication list given to you today.  *If you need a refill on your cardiac medications before your next appointment, please call your pharmacy*  Lab Work: NONE If you have labs (blood work) drawn today and your tests are completely normal, you will receive your results only by: MyChart Message (if you have MyChart) OR A paper copy in the mail If you have any lab test that is abnormal or we need to change your treatment, we will call you to review the results.  Testing/Procedures: 3 Day Zio Heart Monitor Your physician has requested that you wear a Zio heart monitor for __3___ days. This will be mailed to your home with instructions on how to apply the monitor and how to return it when finished. Please allow 2 weeks after returning the heart monitor before our office calls you with the results.   Follow-Up: At [facility], you and your health needs are our priority.  As part of our continuing mission to provide you with exceptional heart care, our providers are all part of one team.  This team includes your primary Cardiologist (physician) and Advanced Practice Providers or APPs (Physician Assistants and Nurse Practitioners) who all work together to provide you with the care you need, when you need it.  Your next appointment:   3 months  Provider:   [place], MD  We recommend signing up for the patient portal called MyChart.  Sign up information is provided on this After Visit Summary.  MyChart is used to connect with patients for Virtual Visits (Telemedicine).  Patients are able to view lab/test results, encounter notes, upcoming appointments, etc.  Non-urgent messages can be sent to your provider as well.   To learn more about what you can do with MyChart, go to [email or web address].   Other Instructions ZIO XT- Long Term Monitor  Instructions  Your physician has requested you wear a ZIO patch monitor for 3 days.  This is a single patch monitor. [employer] supplies one patch monitor per enrollment. Additional stickers are not available. Please do not apply patch if you will be having a Nuclear Stress Test,  Echocardiogram, Cardiac CT, MRI, or Chest Xray during the period you would be wearing the  monitor. The patch cannot be worn during these tests. You cannot remove and re-apply the  ZIO XT patch monitor.  Your ZIO patch monitor will be mailed 3 day USPS to your address on file. It may take 3-5 days  to receive your monitor after you have been enrolled.  Once you have received your monitor, please review the enclosed instructions. Your monitor  has already been registered assigning a specific monitor serial # to you.  Billing and Patient Assistance Program Information  We have supplied [employer] with any of your insurance information on file for billing purposes. [employer] offers a sliding scale Patient Assistance Program for patients that do not have  insurance, or whose insurance does not completely cover the cost of the ZIO monitor.  You must apply for the Patient Assistance Program to qualify for this discounted rate.  To apply, please call [employer] at [phone], select option 4, select option 2, ask to apply for  Patient Assistance Program. Meredeth will ask your household income, and how many people  are in your household. They will quote your out-of-pocket cost based on that information.  [employer] will also be able to set  up a 20-month, interest-free payment plan if needed.  Applying the monitor   Shave hair from upper left chest.  Hold abrader disc by orange tab. Rub abrader in 40 strokes over the upper left chest as  indicated in your monitor instructions.  Clean area with 4 enclosed alcohol pads. Let dry.  Apply patch as indicated in monitor instructions. Patch will be placed under collarbone on left  side  of chest with arrow pointing upward.  Rub patch adhesive wings for 2 minutes. Remove white label marked 1. Remove the white  label marked 2. Rub patch adhesive wings for 2 additional minutes.  While looking in a mirror, press and release button in center of patch. A small green light will  flash 3-4 times. This will be your only indicator that the monitor has been turned on.  Do not shower for the first 24 hours. You may shower after the first 24 hours.  Press the button if you feel a symptom. You will hear a small click. Record Date, Time and  Symptom in the Patient Logbook.  When you are ready to remove the patch, follow instructions on the last 2 pages of Patient  Logbook. Stick patch monitor onto the last page of Patient Logbook.  Place Patient Logbook in the blue and white box. Use locking tab on box and tape box closed  securely. The blue and white box has prepaid postage on it. Please place it in the mailbox as  soon as possible. Your physician should have your test results approximately 7 days after the  monitor has been mailed back to [facility].  Call [facility] Customer Care at [phone] if you have questions regarding  your ZIO XT patch monitor. Call them immediately if you see an orange light blinking on your  monitor.  If your monitor falls off in less than 4 days, contact our Monitor department at [phone].  If your monitor becomes loose or falls off after 4 days call [employer] at [phone] for  suggestions on securing your monitor

## 2024-03-16 DIAGNOSIS — R001 Bradycardia, unspecified: Secondary | ICD-10-CM | POA: Diagnosis not present

## 2024-03-18 DIAGNOSIS — K080 Exfoliation of teeth due to systemic causes: Secondary | ICD-10-CM | POA: Diagnosis not present

## 2024-03-21 ENCOUNTER — Ambulatory Visit: Payer: Self-pay | Admitting: Cardiology

## 2024-03-21 DIAGNOSIS — R001 Bradycardia, unspecified: Secondary | ICD-10-CM | POA: Diagnosis not present

## 2024-03-23 ENCOUNTER — Other Ambulatory Visit: Payer: Self-pay | Admitting: Cardiology

## 2024-03-23 DIAGNOSIS — I2510 Atherosclerotic heart disease of native coronary artery without angina pectoris: Secondary | ICD-10-CM

## 2024-03-23 DIAGNOSIS — Z951 Presence of aortocoronary bypass graft: Secondary | ICD-10-CM

## 2024-04-19 ENCOUNTER — Telehealth: Payer: Self-pay | Admitting: Cardiology

## 2024-04-19 MED ORDER — EZETIMIBE 10 MG PO TABS
10.0000 mg | ORAL_TABLET | Freq: Every day | ORAL | 3 refills | Status: AC
Start: 2024-04-19 — End: ?

## 2024-04-19 NOTE — Telephone Encounter (Signed)
*  STAT* If patient is at the pharmacy, call can be transferred to refill team.   1. Which medications need to be refilled? (please list name of each medication and dose if known) ezetimibe (ZETIA) 10 MG tablet   2. Which pharmacy/location (including street and city if local pharmacy) is medication to be sent to? [facility] [place], [place] - [address]    3. Do they need a 30 day or 90 day supply? 90

## 2024-04-19 NOTE — Telephone Encounter (Signed)
 Pt's medication was sent to pt's pharmacy as requested. Confirmation received.
